# Patient Record
Sex: Female | Born: 1945 | Race: White | Hispanic: No | Smoking: Never smoker
Health system: Southern US, Community
[De-identification: ages and names within clinical notes are randomized; demographics above are authoritative.]

## PROBLEM LIST (undated history)

## (undated) DIAGNOSIS — I639 Cerebral infarction, unspecified: Secondary | ICD-10-CM

## (undated) DIAGNOSIS — I10 Essential (primary) hypertension: Secondary | ICD-10-CM

## (undated) DIAGNOSIS — N289 Disorder of kidney and ureter, unspecified: Secondary | ICD-10-CM

## (undated) DIAGNOSIS — E119 Type 2 diabetes mellitus without complications: Secondary | ICD-10-CM

## (undated) DIAGNOSIS — G610 Guillain-Barre syndrome: Secondary | ICD-10-CM

## (undated) HISTORY — DX: Guillain-Barre syndrome: G61.0

## (undated) HISTORY — DX: Cerebral infarction, unspecified: I63.9

---

## 2014-03-24 ENCOUNTER — Other Ambulatory Visit: Payer: Self-pay

## 2014-03-24 ENCOUNTER — Emergency Department (HOSPITAL_COMMUNITY): Payer: Medicare Other

## 2014-03-24 ENCOUNTER — Encounter (HOSPITAL_COMMUNITY): Payer: Self-pay | Admitting: *Deleted

## 2014-03-24 ENCOUNTER — Observation Stay (HOSPITAL_COMMUNITY)
Admission: EM | Admit: 2014-03-24 | Discharge: 2014-03-30 | Disposition: A | Discharge status: Skilled Nursing Facility | Payer: Medicare Other | Attending: Internal Medicine | Admitting: Internal Medicine

## 2014-03-24 DIAGNOSIS — R109 Unspecified abdominal pain: Secondary | ICD-10-CM | POA: Diagnosis present

## 2014-03-24 DIAGNOSIS — E119 Type 2 diabetes mellitus without complications: Secondary | ICD-10-CM | POA: Diagnosis not present

## 2014-03-24 DIAGNOSIS — K8010 Calculus of gallbladder with chronic cholecystitis without obstruction: Principal | ICD-10-CM | POA: Insufficient documentation

## 2014-03-24 DIAGNOSIS — K5900 Constipation, unspecified: Secondary | ICD-10-CM | POA: Diagnosis present

## 2014-03-24 DIAGNOSIS — G629 Polyneuropathy, unspecified: Secondary | ICD-10-CM

## 2014-03-24 DIAGNOSIS — R252 Cramp and spasm: Secondary | ICD-10-CM | POA: Diagnosis not present

## 2014-03-24 DIAGNOSIS — R9431 Abnormal electrocardiogram [ECG] [EKG]: Secondary | ICD-10-CM | POA: Diagnosis not present

## 2014-03-24 DIAGNOSIS — E871 Hypo-osmolality and hyponatremia: Secondary | ICD-10-CM | POA: Diagnosis not present

## 2014-03-24 DIAGNOSIS — E860 Dehydration: Secondary | ICD-10-CM | POA: Diagnosis not present

## 2014-03-24 DIAGNOSIS — K810 Acute cholecystitis: Secondary | ICD-10-CM

## 2014-03-24 DIAGNOSIS — R110 Nausea: Secondary | ICD-10-CM | POA: Diagnosis present

## 2014-03-24 DIAGNOSIS — E8770 Fluid overload, unspecified: Secondary | ICD-10-CM

## 2014-03-24 DIAGNOSIS — I5033 Acute on chronic diastolic (congestive) heart failure: Secondary | ICD-10-CM | POA: Diagnosis not present

## 2014-03-24 DIAGNOSIS — N390 Urinary tract infection, site not specified: Secondary | ICD-10-CM | POA: Diagnosis not present

## 2014-03-24 DIAGNOSIS — E785 Hyperlipidemia, unspecified: Secondary | ICD-10-CM | POA: Insufficient documentation

## 2014-03-24 DIAGNOSIS — E876 Hypokalemia: Secondary | ICD-10-CM | POA: Diagnosis present

## 2014-03-24 DIAGNOSIS — M6281 Muscle weakness (generalized): Secondary | ICD-10-CM | POA: Diagnosis present

## 2014-03-24 DIAGNOSIS — R1013 Epigastric pain: Secondary | ICD-10-CM

## 2014-03-24 DIAGNOSIS — N179 Acute kidney failure, unspecified: Secondary | ICD-10-CM | POA: Diagnosis not present

## 2014-03-24 DIAGNOSIS — N289 Disorder of kidney and ureter, unspecified: Secondary | ICD-10-CM

## 2014-03-24 DIAGNOSIS — I10 Essential (primary) hypertension: Secondary | ICD-10-CM | POA: Diagnosis not present

## 2014-03-24 DIAGNOSIS — I5032 Chronic diastolic (congestive) heart failure: Secondary | ICD-10-CM | POA: Diagnosis present

## 2014-03-24 DIAGNOSIS — R509 Fever, unspecified: Secondary | ICD-10-CM

## 2014-03-24 DIAGNOSIS — R079 Chest pain, unspecified: Secondary | ICD-10-CM

## 2014-03-24 DIAGNOSIS — M25611 Stiffness of right shoulder, not elsewhere classified: Secondary | ICD-10-CM | POA: Diagnosis present

## 2014-03-24 DIAGNOSIS — R531 Weakness: Secondary | ICD-10-CM

## 2014-03-24 DIAGNOSIS — K8020 Calculus of gallbladder without cholecystitis without obstruction: Secondary | ICD-10-CM

## 2014-03-24 DIAGNOSIS — R630 Anorexia: Secondary | ICD-10-CM

## 2014-03-24 HISTORY — DX: Type 2 diabetes mellitus without complications: E11.9

## 2014-03-24 HISTORY — DX: Essential (primary) hypertension: I10

## 2014-03-24 LAB — CBC WITH DIFFERENTIAL/PLATELET
Basophils Absolute: 0 10*3/uL (ref 0.0–0.1)
Basophils Relative: 0 % (ref 0–1)
EOS ABS: 0 10*3/uL (ref 0.0–0.7)
Eosinophils Relative: 0 % (ref 0–5)
HEMATOCRIT: 42 % (ref 36.0–46.0)
Hemoglobin: 14.5 g/dL (ref 12.0–15.0)
LYMPHS PCT: 15 % (ref 12–46)
Lymphs Abs: 1.5 10*3/uL (ref 0.7–4.0)
MCH: 29.5 pg (ref 26.0–34.0)
MCHC: 34.5 g/dL (ref 30.0–36.0)
MCV: 85.4 fL (ref 78.0–100.0)
Monocytes Absolute: 0.6 10*3/uL (ref 0.1–1.0)
Monocytes Relative: 6 % (ref 3–12)
NEUTROS ABS: 7.6 10*3/uL (ref 1.7–7.7)
Neutrophils Relative %: 79 % — ABNORMAL HIGH (ref 43–77)
PLATELETS: 183 10*3/uL (ref 150–400)
RBC: 4.92 MIL/uL (ref 3.87–5.11)
RDW: 14.4 % (ref 11.5–15.5)
WBC: 9.7 10*3/uL (ref 4.0–10.5)

## 2014-03-24 LAB — URINALYSIS, ROUTINE W REFLEX MICROSCOPIC
BILIRUBIN URINE: NEGATIVE
GLUCOSE, UA: NEGATIVE mg/dL
Hgb urine dipstick: NEGATIVE
Ketones, ur: 15 mg/dL — AB
NITRITE: NEGATIVE
PH: 7 (ref 5.0–8.0)
Protein, ur: NEGATIVE mg/dL
SPECIFIC GRAVITY, URINE: 1.004 — AB (ref 1.005–1.030)
Urobilinogen, UA: 0.2 mg/dL (ref 0.0–1.0)

## 2014-03-24 LAB — COMPREHENSIVE METABOLIC PANEL
ALBUMIN: 3.6 g/dL (ref 3.5–5.2)
ALT: 18 U/L (ref 0–35)
ANION GAP: 15 (ref 5–15)
AST: 30 U/L (ref 0–37)
Alkaline Phosphatase: 89 U/L (ref 39–117)
BILIRUBIN TOTAL: 2.6 mg/dL — AB (ref 0.3–1.2)
BUN: 17 mg/dL (ref 6–23)
CO2: 20 mmol/L (ref 19–32)
CREATININE: 1.21 mg/dL — AB (ref 0.50–1.10)
Calcium: 9.3 mg/dL (ref 8.4–10.5)
Chloride: 96 mEq/L (ref 96–112)
GFR calc non Af Amer: 44 mL/min — ABNORMAL LOW (ref >90)
GFR, EST AFRICAN AMERICAN: 51 mL/min — AB (ref >90)
GLUCOSE: 144 mg/dL — AB (ref 70–99)
POTASSIUM: 3.8 mmol/L (ref 3.5–5.1)
Sodium: 131 mmol/L — ABNORMAL LOW (ref 135–145)
TOTAL PROTEIN: 5.9 g/dL — AB (ref 6.0–8.3)

## 2014-03-24 LAB — D-DIMER, QUANTITATIVE: D-Dimer, Quant: 0.5 ug/mL-FEU — ABNORMAL HIGH (ref 0.00–0.48)

## 2014-03-24 LAB — URINE MICROSCOPIC-ADD ON

## 2014-03-24 LAB — LIPASE, BLOOD: LIPASE: 26 U/L (ref 11–59)

## 2014-03-24 MED ORDER — ONDANSETRON HCL 4 MG/2ML IJ SOLN
4.0000 mg | Freq: Once | INTRAMUSCULAR | Status: AC
  Administered 2014-03-24: 4 mg via INTRAVENOUS
  Filled 2014-03-24: qty 2

## 2014-03-24 MED ORDER — SODIUM CHLORIDE 0.9 % IV SOLN
INTRAVENOUS | Status: DC
  Administered 2014-03-24 – 2014-03-25 (×2): via INTRAVENOUS

## 2014-03-24 MED ORDER — FENTANYL CITRATE 0.05 MG/ML IJ SOLN
100.0000 ug | Freq: Once | INTRAMUSCULAR | Status: AC
  Administered 2014-03-24: 100 ug via INTRAVENOUS
  Filled 2014-03-24: qty 2

## 2014-03-24 MED ORDER — FENTANYL CITRATE 0.05 MG/ML IJ SOLN: 100.0000 ug | Freq: Once | INTRAMUSCULAR | Status: DC

## 2014-03-24 MED ORDER — PANTOPRAZOLE SODIUM 40 MG IV SOLR
40.0000 mg | Freq: Once | INTRAVENOUS | Status: AC
  Administered 2014-03-24: 40 mg via INTRAVENOUS
  Filled 2014-03-24: qty 40

## 2014-03-24 NOTE — ED Notes (Signed)
Pt off unit with Xray 

## 2014-03-24 NOTE — ED Notes (Signed)
Pt back to unit from x-ray

## 2014-03-24 NOTE — ED Notes (Signed)
Per EMS, pt has been having CP x 3 days intermittently. [facility] where she was told she had a her[facility]. EMS reports that there was some depression in lateral leads. Pt received 324ASA and 1 nitro and is no longer having active chest pain but still having back pain. Pt received 200 LR and has an 18g in left AC.

## 2014-03-24 NOTE — ED Provider Notes (Signed)
CSN: [id number]     Arrival date & time 03/24/14  1811 History   First MD Initiated Contact with Patient 03/24/14 1821     Chief Complaint  Patient presents with  . Chest Pain     (Consider location/radiation/quality/duration/timing/severity/associated sxs/prior Treatment) Patient is a 69 y.o. female presenting with chest pain. The history is provided by the patient.  Chest Pain  Tiffay Pinette is a 69 y.o. female who is here for evaluation of continuous upper abdominal/chest pain, which has been present for 5 days.  She has periods of time when it is tolerable at about 4 or 5 out of 10, but then at other times it goes up to 10 out of 10.  She has been evaluated twice in a emergency department, in [place], [place].  She was treated with Percocet and Cipro.  She was diagnosed with nonspecific abdominal pain, and urinary tract infection.  She could not take the Percocet because it made her nauseated and dizzy.  She has never had this problem previously.  She has been evaluated with CT scan of the head, and the abdomen.  She denies cardiac history.  Her husband died 6 weeks ago.  When talking about his death, the patient began to be tearful, for a short period of time. The family members state that she is doing pretty well with this transition.  Over the weekend, they went to [place], [place]; while there she had fallen, and was happy.  Patient's daughters are with her and to not feel like that the death of her spouse is causing the patient to have pain.  She describes the pain as "bandlike" around her entire thorax.  She has decreased appetite but no nausea, vomiting or diarrhea.  She was transferred here by EMS, during transport was treated with aspirin and nitroglycerin.  She had improvement of her chest discomfort but still has pain 4 out of 10 at the time of arrival and evaluation.  There are no other known modifying factors.    History reviewed. No pertinent past medical history. History  reviewed. No pertinent past surgical history. No family history on file. History  Substance Use Topics  . Smoking status: Not on file  . Smokeless tobacco: Not on file  . Alcohol Use: Not on file   OB History    No data available     Review of Systems  Cardiovascular: Positive for chest pain.  All other systems reviewed and are negative.     Allergies  Review of patient's allergies indicates not on file.  Home Medications   Prior to Admission medications   Not on File   BP 170/94 mmHg  Pulse 89  Temp(Src) 98.9 F (37.2 C) (Oral)  Resp 24  SpO2 100% Physical Exam  Constitutional: She is oriented to person, place, and time. She appears well-developed and well-nourished. No distress.  HENT:  Head: Normocephalic and atraumatic.  Right Ear: External ear normal.  Left Ear: External ear normal.  Eyes: Conjunctivae and EOM are normal. Pupils are equal, round, and reactive to light.  Neck: Normal range of motion and phonation normal. Neck supple.  Cardiovascular: Normal rate, regular rhythm and normal heart sounds.   Pulmonary/Chest: Effort normal and breath sounds normal. She exhibits no bony tenderness.  No focal chest wall tenderness, or crepitation.  Abdominal: Soft. There is tenderness (Epigastric, mild).  Musculoskeletal: Normal range of motion. She exhibits no edema or tenderness.  Neurological: She is alert and oriented to person, place,  and time. No cranial nerve deficit or sensory deficit. She exhibits normal muscle tone. Coordination normal.  Skin: Skin is warm, dry and intact.  Psychiatric: She has a normal mood and affect. Her behavior is normal. Judgment and thought content normal.  Nursing note and vitals reviewed.   ED Course  Procedures (including critical care time) Medications  0.9 %  sodium chloride infusion ( Intravenous Stopped 03/24/14 2328)  fentaNYL (SUBLIMAZE) injection 100 mcg (not administered)  pantoprazole (PROTONIX) injection 40 mg (40 mg  Intravenous Given 03/24/14 1932)  fentaNYL (SUBLIMAZE) injection 100 mcg (100 mcg Intravenous Given 03/24/14 1936)  ondansetron (ZOFRAN) injection 4 mg (4 mg Intravenous Given 03/24/14 1935)  fentaNYL (SUBLIMAZE) injection 100 mcg (100 mcg Intravenous Given 03/24/14 2245)  ondansetron (ZOFRAN) injection 4 mg (4 mg Intravenous Given 03/24/14 2245)    Patient Vitals for the past 24 hrs:  BP Temp Temp src Pulse Resp SpO2 Height Weight  03/24/14 2325 171/75 mmHg - - 82 25 100 % - -  03/24/14 2245 158/72 mmHg - - 94 20 100 % - -  03/24/14 2230 165/79 mmHg - - 89 21 100 % - -  03/24/14 2215 165/89 mmHg - - 82 19 100 % - -  03/24/14 2150 163/78 mmHg - - 88 23 100 % - -  03/24/14 2100 145/68 mmHg - - 83 18 100 % - -  03/24/14 2045 166/79 mmHg - - 85 18 100 % - -  03/24/14 2030 174/77 mmHg - - 77 18 98 % - -  03/24/14 1930 168/71 mmHg - - 84 22 100 % - -  03/24/14 1900 167/81 mmHg - - 81 13 100 % - -  03/24/14 1859 - - - - - -  (1.549 m) 150 lb (68.04 kg)  03/24/14 1845 158/85 mmHg - - 83 16 100 % - -  03/24/14 1825 170/94 mmHg 98.9 F (37.2 C) Oral 89 24 100 % - -  03/24/14 1819 - - - - - 98 % - -    11:41 PM Reevaluation with update and discussion. After initial assessment and treatment, an updated evaluation reveals attempt at ambulation failed because of weakness and exacerbation of pain, when attempting to stand.  Findings discussed with patient, and family members, all questions answered. Tally Mattox L   23:35- consultation general surgery, Dr. Janee Morn requests medical admission and he will evaluate the patient, as a consultant for possible cholecystectomy.  11:42 PM-Consult complete with Dr. Julian Reil. Patient case explained and discussed. He agrees to admit patient for further evaluation and treatment. Call ended at 23:50      Labs Review Labs Reviewed - No data to display  Imaging Review No results found.   EKG Interpretation   Date/Time:  Thursday March 24 2014 18:16:17  EST Ventricular Rate:  83 PR Interval:  126 QRS Duration: 86 QT Interval:  448 QTC Calculation: 526 R Axis:   48 Text Interpretation:  Normal sinus rhythm ST \\T \ T wave abnormality,  consider inferior ischemia Prolonged QT Abnormal ECG No old tracing to  compare Confirmed by [facility]  MD, Thorvald Orsino [phone]) on 03/24/2014 6:52:03 PM      MDM   Final diagnoses:  Epigastric pain  Gallstones  Nonspecific chest pain  Renal insufficiency  Weakness  Anorexia   Nonspecific upper abdominal/lower chest pain, persistent for several days.  She has had extensive evaluations, and tonight an ultrasound reveals gallstones.  It is unclear if this finding is a unifying diagnosis for all  of her symptoms.  The patient is too weak to stand and ambulate, and this is likely secondary to poor intake over the last several days.  I doubt ACS, PE (age-related d-dimer, is normal), pneumonia, urinary check infection or sepsis.  She will need admission for parenteral fluids, observation and consultation with surgery.  Nursing Notes Reviewed/ Care Coordinated, and agree without changes. Applicable Imaging Reviewed.  Interpretation of Laboratory Data incorporated into ED treatment  Plan: Admit     Flint MelterElliott L Deagan Sevin, MD 03/25/14 [phone]

## 2014-03-24 NOTE — ED Notes (Signed)
Pt returned from ultrasound

## 2014-03-25 ENCOUNTER — Observation Stay (HOSPITAL_COMMUNITY): Payer: Medicare Other | Admitting: Anesthesiology

## 2014-03-25 ENCOUNTER — Observation Stay (HOSPITAL_COMMUNITY): Payer: Medicare Other

## 2014-03-25 ENCOUNTER — Encounter (HOSPITAL_COMMUNITY): Payer: Self-pay | Admitting: Anesthesiology

## 2014-03-25 ENCOUNTER — Encounter (HOSPITAL_COMMUNITY)
Admission: EM | Disposition: A | Discharge status: Skilled Nursing Facility | Payer: Self-pay | Source: Home / Self Care | Attending: Emergency Medicine

## 2014-03-25 DIAGNOSIS — N179 Acute kidney failure, unspecified: Secondary | ICD-10-CM | POA: Diagnosis not present

## 2014-03-25 DIAGNOSIS — K8010 Calculus of gallbladder with chronic cholecystitis without obstruction: Secondary | ICD-10-CM | POA: Diagnosis not present

## 2014-03-25 DIAGNOSIS — R110 Nausea: Secondary | ICD-10-CM

## 2014-03-25 DIAGNOSIS — R1013 Epigastric pain: Secondary | ICD-10-CM

## 2014-03-25 DIAGNOSIS — R109 Unspecified abdominal pain: Secondary | ICD-10-CM | POA: Diagnosis present

## 2014-03-25 DIAGNOSIS — I10 Essential (primary) hypertension: Secondary | ICD-10-CM | POA: Diagnosis not present

## 2014-03-25 DIAGNOSIS — E871 Hypo-osmolality and hyponatremia: Secondary | ICD-10-CM

## 2014-03-25 DIAGNOSIS — I5033 Acute on chronic diastolic (congestive) heart failure: Secondary | ICD-10-CM | POA: Diagnosis not present

## 2014-03-25 HISTORY — PX: CHOLECYSTECTOMY: SHX55

## 2014-03-25 LAB — BASIC METABOLIC PANEL
ANION GAP: 7 (ref 5–15)
Anion gap: 7 (ref 5–15)
BUN: 14 mg/dL (ref 6–23)
BUN: 12 mg/dL (ref 6–23)
CALCIUM: 8.6 mg/dL (ref 8.4–10.5)
CHLORIDE: 105 meq/L (ref 96–112)
CO2: 23 mmol/L (ref 19–32)
CO2: 24 mmol/L (ref 19–32)
Calcium: 8.6 mg/dL (ref 8.4–10.5)
Chloride: 103 mEq/L (ref 96–112)
Creatinine, Ser: 1.17 mg/dL — ABNORMAL HIGH (ref 0.50–1.10)
Creatinine, Ser: 1.1 mg/dL (ref 0.50–1.10)
GFR calc Af Amer: 53 mL/min — ABNORMAL LOW (ref >90)
GFR calc Af Amer: 58 mL/min — ABNORMAL LOW (ref >90)
GFR calc non Af Amer: 46 mL/min — ABNORMAL LOW (ref >90)
GFR calc non Af Amer: 50 mL/min — ABNORMAL LOW (ref >90)
GLUCOSE: 140 mg/dL — AB (ref 70–99)
GLUCOSE: 151 mg/dL — AB (ref 70–99)
Potassium: 2.7 mmol/L — CL (ref 3.5–5.1)
Potassium: 3.9 mmol/L (ref 3.5–5.1)
SODIUM: 134 mmol/L — AB (ref 135–145)
Sodium: 135 mmol/L (ref 135–145)

## 2014-03-25 LAB — CBC WITH DIFFERENTIAL/PLATELET
BASOS ABS: 0 10*3/uL (ref 0.0–0.1)
BASOS PCT: 0 % (ref 0–1)
EOS ABS: 0.2 10*3/uL (ref 0.0–0.7)
EOS PCT: 2 % (ref 0–5)
HCT: 39.8 % (ref 36.0–46.0)
Hemoglobin: 13.2 g/dL (ref 12.0–15.0)
LYMPHS ABS: 1 10*3/uL (ref 0.7–4.0)
Lymphocytes Relative: 11 % — ABNORMAL LOW (ref 12–46)
MCH: 27.9 pg (ref 26.0–34.0)
MCHC: 33.2 g/dL (ref 30.0–36.0)
MCV: 84.1 fL (ref 78.0–100.0)
Monocytes Absolute: 0.8 10*3/uL (ref 0.1–1.0)
Monocytes Relative: 9 % (ref 3–12)
NEUTROS ABS: 7.4 10*3/uL (ref 1.7–7.7)
Neutrophils Relative %: 78 % — ABNORMAL HIGH (ref 43–77)
Platelets: 186 10*3/uL (ref 150–400)
RBC: 4.73 MIL/uL (ref 3.87–5.11)
RDW: 14.7 % (ref 11.5–15.5)
WBC: 9.4 10*3/uL (ref 4.0–10.5)

## 2014-03-25 LAB — HEPATIC FUNCTION PANEL
ALT: 24 U/L (ref 0–35)
AST: 53 U/L — ABNORMAL HIGH (ref 0–37)
Albumin: 3.4 g/dL — ABNORMAL LOW (ref 3.5–5.2)
Alkaline Phosphatase: 82 U/L (ref 39–117)
BILIRUBIN DIRECT: 0.2 mg/dL (ref 0.0–0.3)
Indirect Bilirubin: 0.7 mg/dL (ref 0.3–0.9)
Total Bilirubin: 0.9 mg/dL (ref 0.3–1.2)
Total Protein: 5.9 g/dL — ABNORMAL LOW (ref 6.0–8.3)

## 2014-03-25 LAB — CBC
HCT: 37.2 % (ref 36.0–46.0)
Hemoglobin: 12.4 g/dL (ref 12.0–15.0)
MCH: 27.7 pg (ref 26.0–34.0)
MCHC: 33.3 g/dL (ref 30.0–36.0)
MCV: 83.2 fL (ref 78.0–100.0)
Platelets: 187 10*3/uL (ref 150–400)
RBC: 4.47 MIL/uL (ref 3.87–5.11)
RDW: 14.6 % (ref 11.5–15.5)
WBC: 8.2 10*3/uL (ref 4.0–10.5)

## 2014-03-25 LAB — URINE CULTURE: Special Requests: NORMAL

## 2014-03-25 LAB — TROPONIN I
TROPONIN I: 0.04 ng/mL — AB (ref <0.031)
Troponin I: 0.03 ng/mL (ref <0.031)

## 2014-03-25 LAB — SURGICAL PCR SCREEN
MRSA, PCR: NEGATIVE
Staphylococcus aureus: POSITIVE — AB

## 2014-03-25 LAB — MAGNESIUM: Magnesium: 1.8 mg/dL (ref 1.5–2.5)

## 2014-03-25 SURGERY — LAPAROSCOPIC CHOLECYSTECTOMY WITH INTRAOPERATIVE CHOLANGIOGRAM
Anesthesia: General | Site: Abdomen

## 2014-03-25 MED ORDER — 0.9 % SODIUM CHLORIDE (POUR BTL) OPTIME
TOPICAL | Status: DC | PRN
  Administered 2014-03-25: 1000 mL

## 2014-03-25 MED ORDER — CIPROFLOXACIN IN D5W 400 MG/200ML IV SOLN
400.0000 mg | Freq: Two times a day (BID) | INTRAVENOUS | Status: DC
  Administered 2014-03-25 – 2014-03-26 (×2): 400 mg via INTRAVENOUS
  Filled 2014-03-25 (×4): qty 200

## 2014-03-25 MED ORDER — GLYCOPYRROLATE 0.2 MG/ML IJ SOLN
INTRAMUSCULAR | Status: AC
  Filled 2014-03-25: qty 1

## 2014-03-25 MED ORDER — PHENYLEPHRINE HCL 10 MG/ML IJ SOLN
INTRAMUSCULAR | Status: DC | PRN
  Administered 2014-03-25 (×2): 120 ug via INTRAVENOUS

## 2014-03-25 MED ORDER — LACTATED RINGERS IV SOLN
INTRAVENOUS | Status: DC
  Administered 2014-03-25: 12:00:00 via INTRAVENOUS

## 2014-03-25 MED ORDER — HYDROMORPHONE HCL 1 MG/ML IJ SOLN
0.2500 mg | INTRAMUSCULAR | Status: DC | PRN
  Administered 2014-03-25: 0.5 mg via INTRAVENOUS

## 2014-03-25 MED ORDER — MUPIROCIN 2 % EX OINT
1.0000 "application " | TOPICAL_OINTMENT | Freq: Two times a day (BID) | CUTANEOUS | Status: AC
  Administered 2014-03-25 – 2014-03-29 (×8): 1 via NASAL
  Filled 2014-03-25: qty 22

## 2014-03-25 MED ORDER — FENTANYL CITRATE 0.05 MG/ML IJ SOLN
INTRAMUSCULAR | Status: AC
  Filled 2014-03-25: qty 5

## 2014-03-25 MED ORDER — LIDOCAINE HCL (CARDIAC) 20 MG/ML IV SOLN
INTRAVENOUS | Status: AC
  Filled 2014-03-25: qty 5

## 2014-03-25 MED ORDER — HYDROMORPHONE HCL 1 MG/ML IJ SOLN
0.5000 mg | INTRAMUSCULAR | Status: DC | PRN
  Administered 2014-03-25 (×3): 1 mg via INTRAVENOUS
  Filled 2014-03-25 (×4): qty 1

## 2014-03-25 MED ORDER — CHLORHEXIDINE GLUCONATE CLOTH 2 % EX PADS
6.0000 | MEDICATED_PAD | Freq: Every day | CUTANEOUS | Status: AC
  Administered 2014-03-26 – 2014-03-29 (×4): 6 via TOPICAL

## 2014-03-25 MED ORDER — ARTIFICIAL TEARS OP OINT
TOPICAL_OINTMENT | OPHTHALMIC | Status: DC | PRN
Start: 2014-03-25 — End: 2014-03-25
  Administered 2014-03-25: 1 via OPHTHALMIC

## 2014-03-25 MED ORDER — HYDROMORPHONE HCL 1 MG/ML IJ SOLN
0.5000 mg | Freq: Once | INTRAMUSCULAR | Status: AC
  Administered 2014-03-25: 0.5 mg via INTRAVENOUS
  Filled 2014-03-25: qty 1

## 2014-03-25 MED ORDER — ONDANSETRON HCL 4 MG PO TABS: 4.0000 mg | ORAL_TABLET | Freq: Three times a day (TID) | ORAL | Status: DC | PRN

## 2014-03-25 MED ORDER — METOPROLOL TARTRATE 100 MG PO TABS
100.0000 mg | ORAL_TABLET | Freq: Two times a day (BID) | ORAL | Status: DC
  Administered 2014-03-25 – 2014-03-30 (×11): 100 mg via ORAL
  Filled 2014-03-25 (×13): qty 1

## 2014-03-25 MED ORDER — GI COCKTAIL ~~LOC~~
30.0000 mL | Freq: Three times a day (TID) | ORAL | Status: DC | PRN
  Administered 2014-03-25 – 2014-03-26 (×3): 30 mL via ORAL
  Filled 2014-03-25 (×7): qty 30

## 2014-03-25 MED ORDER — HYDRALAZINE HCL 20 MG/ML IJ SOLN
INTRAMUSCULAR | Status: AC
  Filled 2014-03-25: qty 1

## 2014-03-25 MED ORDER — TRAMADOL HCL 50 MG PO TABS
50.0000 mg | ORAL_TABLET | Freq: Three times a day (TID) | ORAL | Status: DC | PRN
  Administered 2014-03-25: 50 mg via ORAL
  Filled 2014-03-25: qty 1

## 2014-03-25 MED ORDER — SODIUM CHLORIDE 0.9 % IV SOLN
INTRAVENOUS | Status: DC
Start: 2014-03-25 — End: 2014-03-25

## 2014-03-25 MED ORDER — ONDANSETRON HCL 4 MG/2ML IJ SOLN: 4.0000 mg | Freq: Four times a day (QID) | INTRAMUSCULAR | Status: DC | PRN

## 2014-03-25 MED ORDER — BUPIVACAINE HCL (PF) 0.25 % IJ SOLN
INTRAMUSCULAR | Status: AC
  Filled 2014-03-25: qty 30

## 2014-03-25 MED ORDER — ROCURONIUM BROMIDE 50 MG/5ML IV SOLN
INTRAVENOUS | Status: AC
  Filled 2014-03-25: qty 1

## 2014-03-25 MED ORDER — SPOT INK MARKER SYRINGE KIT
PACK | SUBMUCOSAL | Status: AC
  Filled 2014-03-25: qty 5

## 2014-03-25 MED ORDER — OXYCODONE-ACETAMINOPHEN 5-325 MG PO TABS
1.0000 | ORAL_TABLET | ORAL | Status: DC | PRN
  Filled 2014-03-25: qty 2

## 2014-03-25 MED ORDER — ALPRAZOLAM 0.25 MG PO TABS: 0.2500 mg | ORAL_TABLET | Freq: Three times a day (TID) | ORAL | Status: DC | PRN

## 2014-03-25 MED ORDER — PROPOFOL 10 MG/ML IV BOLUS
INTRAVENOUS | Status: AC
  Filled 2014-03-25: qty 20

## 2014-03-25 MED ORDER — ROCURONIUM BROMIDE 100 MG/10ML IV SOLN
INTRAVENOUS | Status: DC | PRN
  Administered 2014-03-25: 40 mg via INTRAVENOUS

## 2014-03-25 MED ORDER — BUPIVACAINE-EPINEPHRINE 0.25% -1:200000 IJ SOLN: INTRAMUSCULAR | Status: DC | PRN

## 2014-03-25 MED ORDER — ONDANSETRON HCL 4 MG PO TABS
4.0000 mg | ORAL_TABLET | Freq: Four times a day (QID) | ORAL | Status: DC | PRN
  Administered 2014-03-30: 4 mg via ORAL
  Filled 2014-03-25: qty 1

## 2014-03-25 MED ORDER — FENTANYL CITRATE 0.05 MG/ML IJ SOLN: 50.0000 ug | INTRAMUSCULAR | Status: DC | PRN

## 2014-03-25 MED ORDER — HYDROMORPHONE HCL 1 MG/ML IJ SOLN
INTRAMUSCULAR | Status: AC
  Filled 2014-03-25: qty 1

## 2014-03-25 MED ORDER — ENOXAPARIN SODIUM 40 MG/0.4ML ~~LOC~~ SOLN
40.0000 mg | SUBCUTANEOUS | Status: DC
  Administered 2014-03-26 – 2014-03-30 (×5): 40 mg via SUBCUTANEOUS
  Filled 2014-03-25 (×6): qty 0.4

## 2014-03-25 MED ORDER — CEFAZOLIN SODIUM-DEXTROSE 2-3 GM-% IV SOLR
INTRAVENOUS | Status: AC
  Filled 2014-03-25: qty 50

## 2014-03-25 MED ORDER — ATORVASTATIN CALCIUM 20 MG PO TABS: 20.0000 mg | ORAL_TABLET | Freq: Every day | ORAL | Status: DC

## 2014-03-25 MED ORDER — SODIUM CHLORIDE 0.9 % IR SOLN
Status: DC | PRN
  Administered 2014-03-25: 1000 mL

## 2014-03-25 MED ORDER — EPHEDRINE SULFATE 50 MG/ML IJ SOLN
INTRAMUSCULAR | Status: DC | PRN
  Administered 2014-03-25 (×2): 10 mg via INTRAVENOUS

## 2014-03-25 MED ORDER — ONDANSETRON HCL 4 MG/2ML IJ SOLN
INTRAMUSCULAR | Status: DC | PRN
  Administered 2014-03-25: 4 mg via INTRAVENOUS

## 2014-03-25 MED ORDER — ONDANSETRON HCL 4 MG/2ML IJ SOLN
INTRAMUSCULAR | Status: AC
  Filled 2014-03-25: qty 2

## 2014-03-25 MED ORDER — POTASSIUM CHLORIDE 10 MEQ/100ML IV SOLN
10.0000 meq | INTRAVENOUS | Status: AC
  Administered 2014-03-25 (×4): 10 meq via INTRAVENOUS
  Filled 2014-03-25 (×4): qty 100

## 2014-03-25 MED ORDER — CIPROFLOXACIN HCL 250 MG PO TABS
250.0000 mg | ORAL_TABLET | Freq: Two times a day (BID) | ORAL | Status: DC
  Administered 2014-03-25: 250 mg via ORAL
  Filled 2014-03-25 (×3): qty 1

## 2014-03-25 MED ORDER — CEFAZOLIN SODIUM-DEXTROSE 2-3 GM-% IV SOLR
2.0000 g | Freq: Once | INTRAVENOUS | Status: AC
  Administered 2014-03-25: 2 g via INTRAVENOUS

## 2014-03-25 MED ORDER — CIPROFLOXACIN IN D5W 400 MG/200ML IV SOLN
400.0000 mg | Freq: Once | INTRAVENOUS | Status: AC
  Administered 2014-03-25: 400 mg via INTRAVENOUS
  Filled 2014-03-25: qty 200

## 2014-03-25 MED ORDER — GLYCOPYRROLATE 0.2 MG/ML IJ SOLN
INTRAMUSCULAR | Status: DC | PRN
  Administered 2014-03-25: 0.6 mg via INTRAVENOUS

## 2014-03-25 MED ORDER — LACTATED RINGERS IV SOLN
INTRAVENOUS | Status: DC | PRN
Start: 2014-03-25 — End: 2014-03-25
  Administered 2014-03-25: 14:00:00 via INTRAVENOUS

## 2014-03-25 MED ORDER — HYDROMORPHONE HCL 1 MG/ML IJ SOLN
1.0000 mg | INTRAMUSCULAR | Status: DC | PRN
  Administered 2014-03-25 – 2014-03-26 (×4): 1 mg via INTRAVENOUS
  Filled 2014-03-25 (×3): qty 1

## 2014-03-25 MED ORDER — BUPIVACAINE HCL 0.25 % IJ SOLN
INTRAMUSCULAR | Status: DC | PRN
  Administered 2014-03-25: 4 mL

## 2014-03-25 MED ORDER — MIDAZOLAM HCL 2 MG/2ML IJ SOLN
INTRAMUSCULAR | Status: AC
  Filled 2014-03-25: qty 2

## 2014-03-25 MED ORDER — ATORVASTATIN CALCIUM 20 MG PO TABS
20.0000 mg | ORAL_TABLET | Freq: Every day | ORAL | Status: DC
  Administered 2014-03-25 – 2014-03-28 (×4): 20 mg via ORAL
  Filled 2014-03-25 (×6): qty 1

## 2014-03-25 MED ORDER — LIDOCAINE HCL (CARDIAC) 20 MG/ML IV SOLN
INTRAVENOUS | Status: DC | PRN
  Administered 2014-03-25: 60 mg via INTRAVENOUS

## 2014-03-25 MED ORDER — FENTANYL CITRATE 0.05 MG/ML IJ SOLN
INTRAMUSCULAR | Status: DC | PRN
  Administered 2014-03-25 (×3): 50 ug via INTRAVENOUS

## 2014-03-25 MED ORDER — ONDANSETRON HCL 4 MG/2ML IJ SOLN
4.0000 mg | Freq: Four times a day (QID) | INTRAMUSCULAR | Status: DC | PRN
  Administered 2014-03-25: 4 mg via INTRAVENOUS
  Filled 2014-03-25: qty 2

## 2014-03-25 MED ORDER — SODIUM CHLORIDE 0.9 % IV SOLN: INTRAVENOUS | Status: DC

## 2014-03-25 MED ORDER — HYDRALAZINE HCL 20 MG/ML IJ SOLN
10.0000 mg | INTRAMUSCULAR | Status: DC | PRN
  Administered 2014-03-25: 10 mg via INTRAVENOUS

## 2014-03-25 MED ORDER — ONDANSETRON HCL 4 MG PO TABS: 4.0000 mg | ORAL_TABLET | Freq: Four times a day (QID) | ORAL | Status: DC | PRN

## 2014-03-25 MED ORDER — SODIUM CHLORIDE 0.9 % IV SOLN
INTRAVENOUS | Status: DC | PRN
  Administered 2014-03-25: 9 mL

## 2014-03-25 MED ORDER — MIDAZOLAM HCL 5 MG/5ML IJ SOLN
INTRAMUSCULAR | Status: DC | PRN
  Administered 2014-03-25: 1 mg via INTRAVENOUS

## 2014-03-25 MED ORDER — PROPOFOL 10 MG/ML IV BOLUS
INTRAVENOUS | Status: DC | PRN
  Administered 2014-03-25: 160 mg via INTRAVENOUS

## 2014-03-25 MED ORDER — HEPARIN SODIUM (PORCINE) 5000 UNIT/ML IJ SOLN
5000.0000 [IU] | Freq: Three times a day (TID) | INTRAMUSCULAR | Status: DC
  Administered 2014-03-25 (×2): 5000 [IU] via SUBCUTANEOUS
  Filled 2014-03-25 (×5): qty 1

## 2014-03-25 MED ORDER — DEXTROSE-NACL 5-0.9 % IV SOLN: INTRAVENOUS | Status: DC

## 2014-03-25 MED ORDER — DEXTROSE 5 % IV SOLN
10.0000 mg | INTRAVENOUS | Status: DC | PRN
  Administered 2014-03-25: 100 ug/min via INTRAVENOUS

## 2014-03-25 MED ORDER — CYCLOBENZAPRINE HCL 10 MG PO TABS: 10.0000 mg | ORAL_TABLET | Freq: Every evening | ORAL | Status: DC | PRN

## 2014-03-25 MED ORDER — NEOSTIGMINE METHYLSULFATE 10 MG/10ML IV SOLN
INTRAVENOUS | Status: DC | PRN
  Administered 2014-03-25: 4 mg via INTRAVENOUS

## 2014-03-25 MED ORDER — ACETAMINOPHEN 500 MG PO TABS: 500.0000 mg | ORAL_TABLET | Freq: Four times a day (QID) | ORAL | Status: DC | PRN

## 2014-03-25 SURGICAL SUPPLY — 44 items
APPLIER CLIP ROT 10 11.4 M/L (STAPLE) ×3
BENZOIN TINCTURE PRP APPL 2/3 (GAUZE/BANDAGES/DRESSINGS) ×3 IMPLANT
CANISTER SUCTION 2500CC (MISCELLANEOUS) ×3 IMPLANT
CHLORAPREP W/TINT 26ML (MISCELLANEOUS) ×3 IMPLANT
CLIP APPLIE ROT 10 11.4 M/L (STAPLE) ×1 IMPLANT
CLIP LIGATING HEMO O LOK GREEN (MISCELLANEOUS) ×6 IMPLANT
CLOSURE WOUND 1/2 X4 (GAUZE/BANDAGES/DRESSINGS) ×1
COVER MAYO STAND STRL (DRAPES) ×3 IMPLANT
COVER SURGICAL LIGHT HANDLE (MISCELLANEOUS) ×3 IMPLANT
DRAPE C-ARM 42X72 X-RAY (DRAPES) ×3 IMPLANT
DRAPE LAPAROSCOPIC ABDOMINAL (DRAPES) ×3 IMPLANT
DRSG TEGADERM 2-3/8X2-3/4 SM (GAUZE/BANDAGES/DRESSINGS) ×9 IMPLANT
DRSG TEGADERM 4X4.75 (GAUZE/BANDAGES/DRESSINGS) ×3 IMPLANT
ELECT REM PT RETURN 9FT ADLT (ELECTROSURGICAL) ×3
ELECTRODE REM PT RTRN 9FT ADLT (ELECTROSURGICAL) ×1 IMPLANT
FILTER SMOKE EVAC LAPAROSHD (FILTER) ×3 IMPLANT
GAUZE SPONGE 2X2 8PLY STRL LF (GAUZE/BANDAGES/DRESSINGS) ×1 IMPLANT
GLOVE BIO SURGEON STRL SZ7 (GLOVE) ×3 IMPLANT
GLOVE BIO SURGEON STRL SZ7.5 (GLOVE) ×3 IMPLANT
GLOVE BIOGEL PI IND STRL 7.5 (GLOVE) ×4 IMPLANT
GLOVE BIOGEL PI INDICATOR 7.5 (GLOVE) ×8
GLOVE SURG SS PI 7.0 STRL IVOR (GLOVE) ×3 IMPLANT
GOWN STRL REUS W/ TWL LRG LVL3 (GOWN DISPOSABLE) ×3 IMPLANT
GOWN STRL REUS W/TWL LRG LVL3 (GOWN DISPOSABLE) ×6
KIT BASIN OR (CUSTOM PROCEDURE TRAY) ×3 IMPLANT
KIT ROOM TURNOVER OR (KITS) ×3 IMPLANT
NS IRRIG 1000ML POUR BTL (IV SOLUTION) ×3 IMPLANT
PAD ARMBOARD 7.5X6 YLW CONV (MISCELLANEOUS) ×3 IMPLANT
POUCH SPECIMEN RETRIEVAL 10MM (ENDOMECHANICALS) ×3 IMPLANT
SCISSORS LAP 5X35 DISP (ENDOMECHANICALS) ×3 IMPLANT
SET CHOLANGIOGRAPH 5 50 .035 (SET/KITS/TRAYS/PACK) ×3 IMPLANT
SET IRRIG TUBING LAPAROSCOPIC (IRRIGATION / IRRIGATOR) ×3 IMPLANT
SLEEVE ENDOPATH XCEL 5M (ENDOMECHANICALS) ×3 IMPLANT
SPECIMEN JAR SMALL (MISCELLANEOUS) ×3 IMPLANT
SPONGE GAUZE 2X2 STER 10/PKG (GAUZE/BANDAGES/DRESSINGS) ×2
STRIP CLOSURE SKIN 1/2X4 (GAUZE/BANDAGES/DRESSINGS) ×2 IMPLANT
SUT MNCRL AB 4-0 PS2 18 (SUTURE) ×3 IMPLANT
TOWEL OR 17X24 6PK STRL BLUE (TOWEL DISPOSABLE) ×3 IMPLANT
TOWEL OR 17X26 10 PK STRL BLUE (TOWEL DISPOSABLE) ×3 IMPLANT
TRAY LAPAROSCOPIC (CUSTOM PROCEDURE TRAY) ×3 IMPLANT
TROCAR XCEL BLUNT TIP 100MML (ENDOMECHANICALS) ×3 IMPLANT
TROCAR XCEL NON-BLD 11X100MML (ENDOMECHANICALS) ×3 IMPLANT
TROCAR XCEL NON-BLD 5MMX100MML (ENDOMECHANICALS) ×3 IMPLANT
TUBING INSUFFLATION (TUBING) ×3 IMPLANT

## 2014-03-25 NOTE — Op Note (Signed)
03/24/2014 - 03/25/2014  3:09 PM  PATIENT:  Anna Klein  69 y.o. female  PRE-OPERATIVE DIAGNOSIS:  cholecystitis  POST-OPERATIVE DIAGNOSIS:  cholecystitis  PROCEDURE:  Procedure(s): LAPAROSCOPIC CHOLECYSTECTOMY WITH INTRAOPERATIVE CHOLANGIOGRAM (N/A)  SURGEON:  Surgeon(s) and Role:    * Axel FillerArmando Trudy Kory, MD - Primary  ASSISTANTS: none   ANESTHESIA:   local and general  EBL:  Total I/O In: 0  Out: 100 [Urine:100]  BLOOD ADMINISTERED:none  DRAINS: none   LOCAL MEDICATIONS USED:  BUPIVICAINE   SPECIMEN:  Source of Specimen:  gallbladder  DISPOSITION OF SPECIMEN:  PATHOLOGY  COUNTS:  YES  TOURNIQUET:  * No tourniquets in log *  DICTATION: .Dragon Dictation  Findings:Chronically inflammed GB with stones  Indications for procedure: Pt is a 69 y/o F with RUQ pain and seen to have gallstones.   Details of the procedure: The patient was taken to the operating and placed in the supine position with bilateral SCDs in place. A time out was called and all facts were verified. A pneumoperitoneum was obtained via A Veress needle technique to a pressure of 14mm of mercury. A 5mm trochar was then placed in the right upper quadrant under visualization, and there were no injuries to any abdominal organs. A 11 mm port was then placed in the umbilical region after infiltrating with local anesthesia under direct visualization. A second epigastric port was placed under direct visualization. The gallbladder was identified and retracted, the peritoneum was then sharply dissected from the gallbladder and this dissection was carried down to Calot's triangle. The cystic duct was identified and stripped away circumferentially and seen going into the gallbladder 360, the critical angle was obtained. A Cook catheter was used to perform an intraoperative cholangiogram. The cystic duct and common bile duct were seen free of filling defects. 2 clips were placed proximally one distally and the cystic duct  transected. The cystic artery was identified and 2 clips placed proximally and one distally and transected. We then proceeded to remove the gallbladder off the hepatic fossa with Bovie cautery. A retrieval bag was then placed in the abdomen and gallbladder placed in the bag. The hepatic fossa was then reexamined and hemostasis was achieved with Bovie cautery and was excellent at this portion of the case. The subhepatic fossa and perihepatic fossa was then irrigated until the effluent was clear. The 11 mm trocar fascia was reapproximated with the Endo Close #1 Vicryl x1. The pneumoperitoneum was evacuated and all trochars removed under direct visulalization. The skin was then closed with 4-0 Monocryl and the skin dressed with Steri-Strips, gauze, and tape. The patient was awaken from general anesthesia and taken to the recovery room in stable condition.  PLAN OF CARE: Admit to inpatient   PATIENT DISPOSITION:  PACU - hemodynamically stable.   Delay start of Pharmacological VTE agent (>24hrs) due to surgical blood loss or risk of bleeding: not applicable

## 2014-03-25 NOTE — Transfer of Care (Signed)
Immediate Anesthesia Transfer of Care Note  Patient: Anna Klein  Procedure(s) Performed: Procedure(s): LAPAROSCOPIC CHOLECYSTECTOMY WITH INTRAOPERATIVE CHOLANGIOGRAM (N/A)  Patient Location: PACU  Anesthesia Type:General  Level of Consciousness: awake, alert  and oriented  Airway & Oxygen Therapy: Patient Spontanous Breathing and Patient connected to nasal cannula oxygen  Post-op Assessment: Report given to PACU RN and Post -op Vital signs reviewed and stable  Post vital signs: Reviewed and stable  Complications: No apparent anesthesia complications

## 2014-03-25 NOTE — Anesthesia Preprocedure Evaluation (Addendum)
Anesthesia Evaluation  Patient identified by MRN, date of birth, ID band Patient awake    Reviewed: Allergy & Precautions, NPO status , Patient's Chart, lab work & pertinent test results  Airway Mallampati: II   Neck ROM: full    Dental   Pulmonary neg pulmonary ROS,          Cardiovascular hypertension, Pt. on medications and Pt. on home beta blockers     Neuro/Psych    GI/Hepatic   Endo/Other    Renal/GU      Musculoskeletal   Abdominal   Peds  Hematology   Anesthesia Other Findings   Reproductive/Obstetrics                            Anesthesia Physical Anesthesia Plan  ASA: II  Anesthesia Plan: General   Post-op Pain Management:    Induction: Intravenous  Airway Management Planned: Oral ETT  Additional Equipment:   Intra-op Plan:   Post-operative Plan: Extubation in OR  Informed Consent: I have reviewed the patients History and Physical, chart, labs and discussed the procedure including the risks, benefits and alternatives for the proposed anesthesia with the patient or authorized representative who has indicated his/her understanding and acceptance.   Dental advisory given  Plan Discussed with: Anesthesiologist and Surgeon  Anesthesia Plan Comments:        Anesthesia Quick Evaluation

## 2014-03-25 NOTE — H&P (Addendum)
[facility] History and Physical  Anna BinetShirley Rufino ZOX:[id number] DOB: [birth year] DOA: 03/24/2014  Referring physician: EDP PCP: Amada JupiterKILROY,RITA M, PA-C   Chief Complaint: Abdominal pain   HPI: Anna Klein is a 69 y.o. female previously healthy, presents to ED with c/o abdominal pain.  Pain has been ongoing for past 5 days.  Usually 4-5 in intensity but goes up to 10/10 intensity at times.  Evaluated x2 in [facility] ED.  Treated with percocet and cipro for possible UTI.  CT scan of abd/pelvis showed a hiatal hernia but not much else per daughters report.  Some improvement temporarily with GI cocktail over at [facility].  Husband died recently but patient and daughters dont think this is related to that.  Has decreased appetite, no vomiting, no diarrhea.  Pain located in epigastric area.  Review of Systems: Systems reviewed.  As above, otherwise negative  Past Medical History  Diagnosis Date  . Hypertension    History reviewed. No pertinent past surgical history. Social History:  reports that she has never smoked. She does not have any smokeless tobacco history on file. She reports that she does not drink alcohol or use illicit drugs.  No Known Allergies  History reviewed. No pertinent family history.   Prior to Admission medications   Medication Sig Start Date End Date Taking? Authorizing Provider  acetaminophen (TYLENOL) 500 MG tablet Take 500 mg by mouth every 6 (six) hours as needed for mild pain.   Yes Historical Provider, MD  ALPRAZolam (XANAX) 0.25 MG tablet Take 0.25 mg by mouth 3 (three) times daily as needed for anxiety.   Yes Historical Provider, MD  atorvastatin (LIPITOR) 20 MG tablet Take 20 mg by mouth daily.   Yes Historical Provider, MD  ciprofloxacin (CIPRO) 250 MG tablet Take 250 mg by mouth 2 (two) times daily. Take for 7 days started medication on 03-23-14   Yes Historical Provider, MD  cyclobenzaprine (FLEXERIL) 10 MG tablet Take 10 mg by mouth at bedtime as needed for  muscle spasms.   Yes Historical Provider, MD  furosemide (LASIX) 20 MG tablet Take 20 mg by mouth daily.   Yes Historical Provider, MD  metoprolol (LOPRESSOR) 100 MG tablet Take 100 mg by mouth 2 (two) times daily.   Yes Historical Provider, MD  ondansetron (ZOFRAN) 4 MG tablet Take 4 mg by mouth every 8 (eight) hours as needed for nausea or vomiting.   Yes Historical Provider, MD  traMADol (ULTRAM) 50 MG tablet Take 50 mg by mouth 3 (three) times daily as needed for moderate pain.   Yes Historical Provider, MD   Physical Exam: Filed Vitals:   03/24/14 2325  BP: 171/75  Pulse: 82  Temp:   Resp: 25    BP 171/75 mmHg  Pulse 82  Temp(Src) 98.9 F (37.2 C) (Oral)  Resp 25  Ht 5\' 1"  (1.549 m)  Wt 68.04 kg (150 lb)  BMI 28.36 kg/m2  SpO2 100%  General Appearance:    Alert, oriented, no distress, appears stated age  Head:    Normocephalic, atraumatic  Eyes:    PERRL, EOMI, sclera non-icteric        Nose:   Nares without drainage or epistaxis. Mucosa, turbinates normal  Throat:   Moist mucous membranes. Oropharynx without erythema or exudate.  Neck:   Supple. No carotid bruits.  No thyromegaly.  No lymphadenopathy.   Back:     No CVA tenderness, no spinal tenderness  Lungs:     Clear to auscultation bilaterally, without  wheezes, rhonchi or rales  Chest wall:    No tenderness to palpitation  Heart:    Regular rate and rhythm without murmurs, gallops, rubs  Abdomen:     Soft, epigastric tenderness, nondistended, normal bowel sounds, no organomegaly  Genitalia:    deferred  Rectal:    deferred  Extremities:   No clubbing, cyanosis or edema.  Pulses:   2+ and symmetric all extremities  Skin:   Skin color, texture, turgor normal, no rashes or lesions  Lymph nodes:   Cervical, supraclavicular, and axillary nodes normal  Neurologic:   CNII-XII intact. Normal strength, sensation and reflexes      throughout    Labs on Admission:  Basic Metabolic Panel:  Recent Labs Lab  03/24/14 1914  NA 131*  K 3.8  CL 96  CO2 20  GLUCOSE 144*  BUN 17  CREATININE 1.21*  CALCIUM 9.3   Liver Function Tests:  Recent Labs Lab 03/24/14 1914  AST 30  ALT 18  ALKPHOS 89  BILITOT 2.6*  PROT 5.9*  ALBUMIN 3.6    Recent Labs Lab 03/24/14 1914  LIPASE 26   No results for input(s): AMMONIA in the last 168 hours. CBC:  Recent Labs Lab 03/24/14 1914  WBC 9.7  NEUTROABS 7.6  HGB 14.5  HCT 42.0  MCV 85.4  PLT 183   Cardiac Enzymes: No results for input(s): CKTOTAL, CKMB, CKMBINDEX, TROPONINI in the last 168 hours.  BNP (last 3 results) No results for input(s): PROBNP in the last 8760 hours. CBG: No results for input(s): GLUCAP in the last 168 hours.  Radiological Exams on Admission: Dg Chest 2 View  03/24/2014   CLINICAL DATA:  Chest pain for 3 days.  EXAM: CHEST  2 VIEW  COMPARISON:  CT scan of March 22, 2014.  FINDINGS: The heart size and mediastinal contours are within normal limits. Both lungs are clear. No pneumothorax or pleural effusion is noted. Compression deformity of lower thoracic vertebral body is noted consistent with old fracture.  IMPRESSION: No acute cardiopulmonary abnormality seen.   Electronically Signed   By: Roque Lias M.D.   On: 03/24/2014 20:01   US Abdomen Complete  03/24/2014   CLINICAL DATA:  Chest pain.  Hypertension.  EXAM: ULTRASOUND ABDOMEN COMPLETE  COMPARISON:  CT abdomen and pelvis 03/22/2014.  FINDINGS: Gallbladder: Multiple stones in the dependent gallbladder, largest measuring 1.2 cm diameter. No gallbladder wall thickening or edema. Murphy's sign is negative.  Common bile duct: Diameter: 3.5 mm, normal  Liver: No focal lesion identified. Within normal limits in parenchymal echogenicity.  IVC: Not visualized due to overlying bowel gas.  Pancreas: Not visualized due to overlying bowel gas.  Spleen: Size and appearance within normal limits.  Right Kidney: Length: 7.9 cm. Mild diffuse parenchymal thinning and increased  echotexture suggesting medical renal disease. No solid mass or hydronephrosis.  Left Kidney: Length: 9.1 cm. Mild diffuse parenchymal thinning and increased echotexture suggesting medical renal disease. No solid mass or hydronephrosis.  Abdominal aorta: Not visualized due to overlying bowel gas.  Other findings: None.  IMPRESSION: Cholelithiasis without additional changes of cholecystitis. Mild renal parenchymal changes suggesting chronic medical renal disease. No hydronephrosis.   Electronically Signed   By: Burman Nieves M.D.   On: 03/24/2014 22:03    EKG: Independently reviewed.  Assessment/Plan Active Problems:   Abdominal pain   Dehydration with hyponatremia   Nausea   1. Abdominal pain - unclear cause 1. Dilaudid PRN 2. Gi cocktail PRN 3.  General surgery eval 1. Gallstones seen on [place] but no ductal dilation, no gallbladder wall thickening, negative Murphy's, no transaminitis. 2. ? Hiatal hernia causing problems (It is at least Type III possibly Type IV on the CT scan (I can see the images from our system but dont have the official read available).  Does have "some" vomiting, would have suspected them to have seen gastric volvulus on the CT scan however. 4. Consider GI eval for diagnostic upper endoscopy 5. Is still the possibility that this could be related to recent loss of her husband, but need to rule out medical pathology first. 2. Dehydration and hyponatremia - IVF, recheck BMP in AM. 3. Nausea and vomiting- zofran PRN  General surgery was called by EDP and is going to evaluate  Code Status: Full Code  Family Communication: Family at bedside Disposition Plan: Admit to obs   Time spent: 70 min  Philmore Lepore M. [facility] Pager [phone]  If 7AM-7PM, please contact the day team taking care of the patient [email or web address] Password [facility] 03/25/2014, 12:35 AM

## 2014-03-25 NOTE — Progress Notes (Signed)
UR completed 

## 2014-03-25 NOTE — Anesthesia Postprocedure Evaluation (Signed)
Anesthesia Post Note  Patient: Anna Klein  Procedure(s) Performed: Procedure(s) (LRB): LAPAROSCOPIC CHOLECYSTECTOMY WITH INTRAOPERATIVE CHOLANGIOGRAM (N/A)  Anesthesia type: General  Patient location: PACU  Post pain: Pain level controlled and Adequate analgesia  Post assessment: Post-op Vital signs reviewed, Patient's Cardiovascular Status Stable, Respiratory Function Stable, Patent Airway and Pain level controlled  Last Vitals:  Filed Vitals:   03/25/14 1600  BP: 142/55  Pulse: 68  Temp:   Resp: 14    Post vital signs: Reviewed and stable  Level of consciousness: awake, alert  and oriented  Complications: No apparent anesthesia complications

## 2014-03-25 NOTE — Progress Notes (Signed)
Patient admitted after midnight by Dr. Julian ReilGardner.  Will get STAT BMP AND troponin to follow up.  Tent plan is for surgery today.  ? If troponin is from HTN.  If continuing to rise, may need cardiology consult. Marlin CanaryJessica Vann DO

## 2014-03-25 NOTE — Progress Notes (Signed)
Received patient from PACU. Pt arousable., but briefly.

## 2014-03-25 NOTE — Consult Note (Signed)
Reason for Consult: Gallstones and abdominal pain Referring Physician: Issabelle Mcraney Klein is an 69 y.o. female.  HPI: Anna Klein developed upper abdominal pain mostly in the right upper quadrant extending to her back 3 days ago. She had associated nausea and vomiting. She was seen in the emergency department at [facility] CT scan abdomen and pelvis was done at that time which showed a hiatal hernia but no other significant abnormalities. She was discharged home but did not improve. [facility] came to [facility] earlier tonight. Further workup included liver function tests demonstrating elevated bilirubin. Abdominal ultrasound demonstrated multiple gallstones. I was asked to see her to consider cholecystectomy. She has been admitted to the hospitalist service. Her daughter is with her and assists with history.  Past Medical History  Diagnosis Date  . Hypertension     History reviewed. No pertinent past surgical history.  History reviewed. No pertinent family history.  Social History:  reports that she has never smoked. She does not have any smokeless tobacco history on file. She reports that she does not drink alcohol or use illicit drugs.  Allergies: No Known Allergies  Medications:  Scheduled: . atorvastatin  20 mg Oral Daily  . ciprofloxacin  400 mg Intravenous Once  . ciprofloxacin  250 mg Oral BID  . fentaNYL  100 mcg Intravenous Once  . heparin  5,000 Units Subcutaneous 3 times per day  . metoprolol  100 mg Oral BID   Continuous: . sodium chloride Stopped (03/24/14 2328)   XBL:[id number], ALPRAZolam, cyclobenzaprine, gi cocktail, HYDROmorphone (DILAUDID) injection, ondansetron **OR** ondansetron (ZOFRAN) IV, traMADol  Results for orders placed or performed during the hospital encounter of 03/24/14 (from the past 48 hour(s))  Urinalysis, Routine w reflex microscopic     Status: Abnormal   Collection Time: 03/24/14  7:09 PM  Result Value Ref Range    Color, Urine YELLOW YELLOW   APPearance CLOUDY (A) CLEAR   Specific Gravity, Urine 1.004 (L) 1.005 - 1.030   pH 7.0 5.0 - 8.0   Glucose, UA NEGATIVE NEGATIVE mg/dL   Hgb urine dipstick NEGATIVE NEGATIVE   Bilirubin Urine NEGATIVE NEGATIVE   Ketones, ur 15 (A) NEGATIVE mg/dL   Protein, ur NEGATIVE NEGATIVE mg/dL   Urobilinogen, UA 0.2 0.0 - 1.0 mg/dL   Nitrite NEGATIVE NEGATIVE   Leukocytes, UA TRACE (A) NEGATIVE  Urine microscopic-add on     Status: None   Collection Time: 03/24/14  7:09 PM  Result Value Ref Range   Squamous Epithelial / LPF RARE RARE   WBC, UA 0-2 <3 WBC/hpf   Bacteria, UA RARE RARE  Comprehensive metabolic panel     Status: Abnormal   Collection Time: 03/24/14  7:14 PM  Result Value Ref Range   Sodium 131 (L) 135 - 145 mmol/L    Comment: Please note change in reference range.   Potassium 3.8 3.5 - 5.1 mmol/L    Comment: Please note change in reference range. SLIGHT HEMOLYSIS    Chloride 96 96 - 112 mEq/L   CO2 20 19 - 32 mmol/L   Glucose, Bld 144 (H) 70 - 99 mg/dL   BUN 17 6 - 23 mg/dL   Creatinine, Ser 1.21 (H) 0.50 - 1.10 mg/dL   Calcium 9.3 8.4 - 10.5 mg/dL   Total Protein 5.9 (L) 6.0 - 8.3 g/dL   Albumin 3.6 3.5 - 5.2 g/dL   AST 30 0 - 37 U/L   ALT 18 0 - 35 U/L  Alkaline Phosphatase 89 39 - 117 U/L   Total Bilirubin 2.6 (H) 0.3 - 1.2 mg/dL   GFR calc non Af Amer 44 (L) >90 mL/min   GFR calc Af Amer 51 (L) >90 mL/min    Comment: (NOTE) The eGFR has been calculated using the CKD EPI equation. This calculation has not been validated in all clinical situations. eGFR's persistently <90 mL/min signify possible Chronic Kidney Disease.    Anion gap 15 5 - 15  CBC with Differential     Status: Abnormal   Collection Time: 03/24/14  7:14 PM  Result Value Ref Range   WBC 9.7 4.0 - 10.5 K/uL    Comment: WHITE COUNT CONFIRMED ON SMEAR   RBC 4.92 3.87 - 5.11 MIL/uL   Hemoglobin 14.5 12.0 - 15.0 g/dL   HCT 42.0 36.0 - 46.0 %   MCV 85.4 78.0 - 100.0  fL   MCH 29.5 26.0 - 34.0 pg   MCHC 34.5 30.0 - 36.0 g/dL   RDW 14.4 11.5 - 15.5 %   Platelets 183 150 - 400 K/uL    Comment: PLATELET COUNT CONFIRMED BY SMEAR REPEATED TO VERIFY SPECIMEN CHECKED FOR CLOTS    Neutrophils Relative % 79 (H) 43 - 77 %   Lymphocytes Relative 15 12 - 46 %   Monocytes Relative 6 3 - 12 %   Eosinophils Relative 0 0 - 5 %   Basophils Relative 0 0 - 1 %   Neutro Abs 7.6 1.7 - 7.7 K/uL   Lymphs Abs 1.5 0.7 - 4.0 K/uL   Monocytes Absolute 0.6 0.1 - 1.0 K/uL   Eosinophils Absolute 0.0 0.0 - 0.7 K/uL   Basophils Absolute 0.0 0.0 - 0.1 K/uL   WBC Morphology ATYPICAL LYMPHOCYTES     Comment: TOXIC GRANULATION VACUOLATED NEUTROPHILS    Smear Review PLATELET CLUMPS NOTED ON SMEAR   Lipase, blood     Status: None   Collection Time: 03/24/14  7:14 PM  Result Value Ref Range   Lipase 26 11 - 59 U/L  D-dimer, quantitative     Status: Abnormal   Collection Time: 03/24/14  7:14 PM  Result Value Ref Range   D-Dimer, Quant 0.50 (H) 0.00 - 0.48 ug/mL-FEU    Comment:        AT THE INHOUSE ESTABLISHED CUTOFF VALUE OF 0.48 ug/mL FEU, THIS ASSAY HAS BEEN DOCUMENTED IN THE LITERATURE TO HAVE A SENSITIVITY AND NEGATIVE PREDICTIVE VALUE OF AT LEAST 98 TO 99%.  THE TEST RESULT SHOULD BE CORRELATED WITH AN ASSESSMENT OF THE CLINICAL PROBABILITY OF DVT / VTE.   CBC     Status: None   Collection Time: 03/25/14  1:20 AM  Result Value Ref Range   WBC 8.2 4.0 - 10.5 K/uL   RBC 4.47 3.87 - 5.11 MIL/uL   Hemoglobin 12.4 12.0 - 15.0 g/dL   HCT 37.2 36.0 - 46.0 %   MCV 83.2 78.0 - 100.0 fL   MCH 27.7 26.0 - 34.0 pg   MCHC 33.3 30.0 - 36.0 g/dL   RDW 14.6 11.5 - 15.5 %   Platelets 187 150 - 400 K/uL    Dg Chest 2 View  03/24/2014   CLINICAL DATA:  Chest pain for 3 days.  EXAM: CHEST  2 VIEW  COMPARISON:  CT scan of March 22, 2014.  FINDINGS: The heart size and mediastinal contours are within normal limits. Both lungs are clear. No pneumothorax or pleural effusion is  noted. Compression deformity of lower  thoracic vertebral body is noted consistent with old fracture.  IMPRESSION: No acute cardiopulmonary abnormality seen.   Electronically Signed   By: Sabino Dick M.D.   On: 03/24/2014 20:01   US Abdomen Complete  03/24/2014   CLINICAL DATA:  Chest pain.  Hypertension.  EXAM: ULTRASOUND ABDOMEN COMPLETE  COMPARISON:  CT abdomen and pelvis 03/22/2014.  FINDINGS: Gallbladder: Multiple stones in the dependent gallbladder, largest measuring 1.2 cm diameter. No gallbladder wall thickening or edema. Murphy's sign is negative.  Common bile duct: Diameter: 3.5 mm, normal  Liver: No focal lesion identified. Within normal limits in parenchymal echogenicity.  IVC: Not visualized due to overlying bowel gas.  Pancreas: Not visualized due to overlying bowel gas.  Spleen: Size and appearance within normal limits.  Right Kidney: Length: 7.9 cm. Mild diffuse parenchymal thinning and increased echotexture suggesting medical renal disease. No solid mass or hydronephrosis.  Left Kidney: Length: 9.1 cm. Mild diffuse parenchymal thinning and increased echotexture suggesting medical renal disease. No solid mass or hydronephrosis.  Abdominal aorta: Not visualized due to overlying bowel gas.  Other findings: None.  IMPRESSION: Cholelithiasis without additional changes of cholecystitis. Mild renal parenchymal changes suggesting chronic medical renal disease. No hydronephrosis.   Electronically Signed   By: Lucienne Capers M.D.   On: 03/24/2014 22:03    Review of Systems  Constitutional: Positive for malaise/fatigue.  HENT: Negative.   Eyes: Negative.   Respiratory: Negative.   Cardiovascular: Negative for chest pain and palpitations.  Gastrointestinal: Positive for nausea, vomiting and abdominal pain.  Genitourinary: Negative.   Musculoskeletal: Negative.   Skin: Negative.   Neurological: Negative.   Endo/Heme/Allergies: Negative.   Psychiatric/Behavioral: Negative.    Blood pressure  171/77, pulse 73, temperature 98.6 F (37 C), temperature source Oral, resp. rate 18, height 5' 1"  (1.549 m), weight 146 lb 13.2 oz (66.6 kg), SpO2 100 %. Physical Exam  Constitutional: She is oriented to person, place, and time. She appears well-developed and well-nourished. No distress.  HENT:  Head: Normocephalic and atraumatic.  Right Ear: External ear normal.  Left Ear: External ear normal.  Nose: Nose normal.  Mouth/Throat: Oropharynx is clear and moist.  Eyes: Conjunctivae and EOM are normal. Pupils are equal, round, and reactive to light. Right eye exhibits no discharge. Left eye exhibits no discharge.  Neck: Normal range of motion. Neck supple. No tracheal deviation present. No thyromegaly present.  Cardiovascular: Normal rate, normal heart sounds and intact distal pulses.   Respiratory: Effort normal and breath sounds normal. No stridor. No respiratory distress. She has no wheezes. She has no rales.  GI: Soft. She exhibits no distension. There is tenderness. There is no rebound and no guarding.  Mild right upper quadrant tenderness without guarding, no generalized tenderness  Musculoskeletal: She exhibits no edema or tenderness.  Neurological: She is alert and oriented to person, place, and time. She exhibits normal muscle tone.  Skin: Skin is warm and dry.  Psychiatric: She has a normal mood and affect.    Assessment/Plan: Symptomatic cholelithiasis and elevated bilirubin - I believe her symptoms are coming from her gallbladder. Recommend repeat liver function tests later this morning. I discussed planning laparoscopic cholecystectomy with clinical exam this admission. Her daughter is concerned that her hiatal hernia is responsible for her symptoms and not her gallbladder. They would like to discuss things further with the rest of our team, including Dr. Georgette Dover prior to deciding about surgery. Continue Cipro, repeat LFTs, NPO. We will re-evaluate later this AM.  Rocket Gunderson  E 03/25/2014, 1:40 AM

## 2014-03-25 NOTE — Progress Notes (Signed)
INITIAL NUTRITION ASSESSMENT  DOCUMENTATION CODES Per approved criteria  -Not Applicable   INTERVENTION: Once diet advances, provide Ensure Complete po BID, each supplement provides 350 kcal and 13 grams of protein.  NUTRITION DIAGNOSIS: Increased nutrient needs related to s/p surgery as evidenced by estimated nutrition needs.   Goal: Pt to meet >/= 90% of their estimated nutrition needs   Monitor:  PO intake, weight trends, labs, I/O's  Reason for Assessment: MST  69 y.o. female  Admitting Dx: Abdominal Pain  ASSESSMENT: Pt presents to ED with c/o abdominal pain. Pain has been ongoing for past 5 days. CT scan of abd/pelvis showed a hiatal hernia .  Pt is currently NPO for surgery, laparoscopic cholecystectomy with intraoperative cholangiogram for acute cholecystitis. Pt reports usually having a great appetite, however up until Monday 1/4 she has had a decreased appetite from her abdominal pains. Pt reports she has still been adequately eating with her usual 5 small meals a day. Weight has been stable with her usual body weight of 145-150 lbs. Pt is agreeable to Ensure once diet advances. RD to order.   Pt with no observed significant fat or muscle mass loss.  Labs: Low sodium and GFR.  Height: Ht Readings from Last 1 Encounters:  03/25/14 5\' 1"  (1.549 m)    Weight: Wt Readings from Last 1 Encounters:  03/25/14 146 lb 13.2 oz (66.6 kg)    Ideal Body Weight: 105 lbs  % Ideal Body Weight: 139 lbs  Wt Readings from Last 10 Encounters:  03/25/14 146 lb 13.2 oz (66.6 kg)    Usual Body Weight: 145-150 lbs  % Usual Body Weight: 100%  BMI:  Body mass index is 27.76 kg/(m^2).  Estimated Nutritional Needs: Kcal: 1800-2000 Protein: 80-90 grams Fluid: 1.8 - 2 L/day  Skin: intact  Diet Order: Diet NPO time specified  EDUCATION NEEDS: -No education needs identified at this time  No intake or output data in the 24 hours ending 03/25/14 1014  Last BM:  1/7  Labs:   Recent Labs Lab 03/24/14 1914 03/25/14 0120 03/25/14 0545  NA 131* 135  --   K 3.8 2.7*  --   CL 96 105  --   CO2 20 23  --   BUN 17 14  --   CREATININE 1.21* 1.17*  --   CALCIUM 9.3 8.6  --   MG  --   --  1.8  GLUCOSE 144* 140*  --     CBG (last 3)  No results for input(s): GLUCAP in the last 72 hours.  Scheduled Meds: . atorvastatin  20 mg Oral Daily  . ciprofloxacin  400 mg Intravenous Q12H  . fentaNYL  100 mcg Intravenous Once  . metoprolol  100 mg Oral BID    Continuous Infusions: . sodium chloride Stopped (03/24/14 2328)    Past Medical History  Diagnosis Date  . Hypertension     History reviewed. No pertinent past surgical history.  Marijean NiemannStephanie La, MS, RD, LDN Pager # [phone] After hours/ weekend pager # [phone]

## 2014-03-25 NOTE — Anesthesia Procedure Notes (Signed)
Procedure Name: Intubation Date/Time: 03/25/2014 2:12 PM Performed by: Leonel Ramsay'LAUGHLIN, Judithann Villamar H Pre-anesthesia Checklist: Patient identified, Emergency Drugs available, Suction available, Patient being monitored and Timeout performed Patient Re-evaluated:Patient Re-evaluated prior to inductionOxygen Delivery Method: Circle system utilized Preoxygenation: Pre-oxygenation with 100% oxygen Intubation Type: IV induction Ventilation: Mask ventilation without difficulty Laryngoscope Size: Mac and 4 Grade View: Grade I Tube type: Oral Tube size: 7.0 mm Number of attempts: 1 Airway Equipment and Method: Stylet Placement Confirmation: ETT inserted through vocal cords under direct vision,  positive ETCO2 and breath sounds checked- equal and bilateral Secured at: 21 cm Tube secured with: Tape Dental Injury: Teeth and Oropharynx as per pre-operative assessment

## 2014-03-25 NOTE — Progress Notes (Signed)
Subjective: Patient very uncomfortable - bloated, nauseated, Epigastric/ RUQ abdominal pain  Objective: Vital signs in last 24 hours: Temp:  [97.5 F (36.4 C)-98.9 F (37.2 C)] 97.5 F (36.4 C) (01/08 0540) Pulse Rate:  [72-94] 72 (01/08 0540) Resp:  [13-26] 16 (01/08 0540) BP: (143-188)/(68-94) 188/80 mmHg (01/08 0540) SpO2:  [98 %-100 %] 100 % (01/08 0540) Weight:  [146 lb 13.2 oz (66.6 kg)-150 lb (68.04 kg)] 146 lb 13.2 oz (66.6 kg) (01/08 0103) Last BM Date: 03/24/14  Intake/Output from previous day:   Intake/Output this shift:    General appearance: alert, cooperative and no distress GI: distended, tender in RUQ/ epigastrium; no palpable masses No jaundice Lab Results:   Recent Labs  03/25/14 0120 03/25/14 0545  WBC 8.2 9.4  HGB 12.4 13.2  HCT 37.2 39.8  PLT 187 186   BMET  Recent Labs  03/24/14 1914 03/25/14 0120  NA 131* 135  K 3.8 2.7*  CL 96 105  CO2 20 23  GLUCOSE 144* 140*  BUN 17 14  CREATININE 1.21* 1.17*  CALCIUM 9.3 8.6   PT/INR No results for input(s): LABPROT, INR in the last 72 hours. ABG No results for input(s): PHART, HCO3 in the last 72 hours.  Invalid input(s): PCO2, PO2  Studies/Results: Dg Chest 2 View  03/24/2014   CLINICAL DATA:  Chest pain for 3 days.  EXAM: CHEST  2 VIEW  COMPARISON:  CT scan of March 22, 2014.  FINDINGS: The heart size and mediastinal contours are within normal limits. Both lungs are clear. No pneumothorax or pleural effusion is noted. Compression deformity of lower thoracic vertebral body is noted consistent with old fracture.  IMPRESSION: No acute cardiopulmonary abnormality seen.   Electronically Signed   By: Roque Lias M.D.   On: 03/24/2014 20:01   US Abdomen Complete  03/24/2014   CLINICAL DATA:  Chest pain.  Hypertension.  EXAM: ULTRASOUND ABDOMEN COMPLETE  COMPARISON:  CT abdomen and pelvis 03/22/2014.  FINDINGS: Gallbladder: Multiple stones in the dependent gallbladder, largest measuring 1.2 cm  diameter. No gallbladder wall thickening or edema. Murphy's sign is negative.  Common bile duct: Diameter: 3.5 mm, normal  Liver: No focal lesion identified. Within normal limits in parenchymal echogenicity.  IVC: Not visualized due to overlying bowel gas.  Pancreas: Not visualized due to overlying bowel gas.  Spleen: Size and appearance within normal limits.  Right Kidney: Length: 7.9 cm. Mild diffuse parenchymal thinning and increased echotexture suggesting medical renal disease. No solid mass or hydronephrosis.  Left Kidney: Length: 9.1 cm. Mild diffuse parenchymal thinning and increased echotexture suggesting medical renal disease. No solid mass or hydronephrosis.  Abdominal aorta: Not visualized due to overlying bowel gas.  Other findings: None.  IMPRESSION: Cholelithiasis without additional changes of cholecystitis. Mild renal parenchymal changes suggesting chronic medical renal disease. No hydronephrosis.   Electronically Signed   By: Burman Nieves M.D.   On: 03/24/2014 22:03    Anti-infectives: Anti-infectives    Start     Dose/Rate Route Frequency Ordered Stop   03/25/14 0015  ciprofloxacin (CIPRO) tablet 250 mg     250 mg Oral 2 times daily 03/25/14 0006     03/25/14 0015  ciprofloxacin (CIPRO) IVPB 400 mg     400 mg200 mL/hr over 60 Minutes Intravenous  Once 03/25/14 0010 03/25/14 0345      Assessment/Plan: s/p * No surgery found * Recommend laparoscopic cholecystectomy with intraoperative cholangiogram for acute cholecystitis First will recheck troponin since last one was  borderline She may have passed a CBD stone since bili down to normal today.  The surgical procedure has been discussed with the patient.  Potential risks, benefits, alternative treatments, and expected outcomes have been explained.  All of the patient's questions at this time have been answered.  The likelihood of reaching the patient's treatment goal is good.  The patient understand the proposed surgical procedure  and wishes to proceed.  Wilmon ArmsMatthew K. Corliss Skainssuei, MD, [facility]  General/ Trauma Surgery  03/25/2014 9:33 AM   LOS: 1 day    Marcile Fuquay K. 03/25/2014

## 2014-03-25 NOTE — Plan of Care (Signed)
Problem: Phase II Progression Outcomes Goal: Foley discontinued Outcome: Not Applicable Date Met:  03/25/14 Pt voids     

## 2014-03-26 DIAGNOSIS — E8779 Other fluid overload: Secondary | ICD-10-CM

## 2014-03-26 DIAGNOSIS — E8770 Fluid overload, unspecified: Secondary | ICD-10-CM | POA: Insufficient documentation

## 2014-03-26 DIAGNOSIS — K810 Acute cholecystitis: Secondary | ICD-10-CM

## 2014-03-26 DIAGNOSIS — M25511 Pain in right shoulder: Secondary | ICD-10-CM

## 2014-03-26 DIAGNOSIS — M25611 Stiffness of right shoulder, not elsewhere classified: Secondary | ICD-10-CM | POA: Diagnosis present

## 2014-03-26 DIAGNOSIS — E876 Hypokalemia: Secondary | ICD-10-CM | POA: Diagnosis present

## 2014-03-26 DIAGNOSIS — R0600 Dyspnea, unspecified: Secondary | ICD-10-CM

## 2014-03-26 LAB — BASIC METABOLIC PANEL
Anion gap: 12 (ref 5–15)
BUN: 11 mg/dL (ref 6–23)
CHLORIDE: 102 meq/L (ref 96–112)
CO2: 20 mmol/L (ref 19–32)
CREATININE: 1.04 mg/dL (ref 0.50–1.10)
Calcium: 8.4 mg/dL (ref 8.4–10.5)
GFR calc non Af Amer: 53 mL/min — ABNORMAL LOW (ref >90)
GFR, EST AFRICAN AMERICAN: 62 mL/min — AB (ref >90)
Glucose, Bld: 116 mg/dL — ABNORMAL HIGH (ref 70–99)
POTASSIUM: 3.3 mmol/L — AB (ref 3.5–5.1)
SODIUM: 134 mmol/L — AB (ref 135–145)

## 2014-03-26 LAB — CBC
HCT: 37.9 % (ref 36.0–46.0)
Hemoglobin: 12.3 g/dL (ref 12.0–15.0)
MCH: 27.7 pg (ref 26.0–34.0)
MCHC: 32.5 g/dL (ref 30.0–36.0)
MCV: 85.4 fL (ref 78.0–100.0)
Platelets: 201 10*3/uL (ref 150–400)
RBC: 4.44 MIL/uL (ref 3.87–5.11)
RDW: 15 % (ref 11.5–15.5)
WBC: 9.4 10*3/uL (ref 4.0–10.5)

## 2014-03-26 LAB — MAGNESIUM: Magnesium: 1.8 mg/dL (ref 1.5–2.5)

## 2014-03-26 LAB — BRAIN NATRIURETIC PEPTIDE: B Natriuretic Peptide: 346.5 pg/mL — ABNORMAL HIGH (ref 0.0–100.0)

## 2014-03-26 MED ORDER — POTASSIUM CHLORIDE CRYS ER 20 MEQ PO TBCR: 40.0000 meq | EXTENDED_RELEASE_TABLET | Freq: Every day | ORAL | Status: DC

## 2014-03-26 MED ORDER — FUROSEMIDE 40 MG PO TABS
40.0000 mg | ORAL_TABLET | Freq: Two times a day (BID) | ORAL | Status: DC
  Administered 2014-03-26 – 2014-03-29 (×7): 40 mg via ORAL
  Filled 2014-03-26 (×8): qty 1

## 2014-03-26 MED ORDER — POTASSIUM CHLORIDE CRYS ER 20 MEQ PO TBCR
80.0000 meq | EXTENDED_RELEASE_TABLET | Freq: Once | ORAL | Status: AC
  Administered 2014-03-26: 80 meq via ORAL
  Filled 2014-03-26: qty 4

## 2014-03-26 MED ORDER — TRAMADOL HCL 50 MG PO TABS
50.0000 mg | ORAL_TABLET | Freq: Four times a day (QID) | ORAL | Status: DC | PRN
  Administered 2014-03-26 – 2014-03-30 (×11): 50 mg via ORAL
  Filled 2014-03-26 (×11): qty 1

## 2014-03-26 MED ORDER — FUROSEMIDE 20 MG PO TABS
20.0000 mg | ORAL_TABLET | Freq: Once | ORAL | Status: AC
  Administered 2014-03-26: 20 mg via ORAL
  Filled 2014-03-26: qty 1

## 2014-03-26 MED ORDER — POTASSIUM CHLORIDE CRYS ER 20 MEQ PO TBCR
40.0000 meq | EXTENDED_RELEASE_TABLET | Freq: Every day | ORAL | Status: DC
  Administered 2014-03-26 – 2014-03-30 (×5): 40 meq via ORAL
  Filled 2014-03-26 (×5): qty 2

## 2014-03-26 NOTE — Progress Notes (Signed)
During the night patient c/o of numbness to feet along with pain under her rub cage. Earlier this morning daughter stated that  the patient was unable to lift her right arm. After assessing the patient patient had som difficulty lifting her arm. Patient was given 1mg  of Dilaudid  Twice during the shift and oftered GI cocktail which will be given. Daughter states that her mom is healthy and can't understand what is going on.

## 2014-03-26 NOTE — Progress Notes (Signed)
PROGRESS NOTE  Anna Klein ZOX:[id number] DOB: [birth year] DOA: 03/24/2014 PCP: Amada Jupiter, PA-C  HPI/Recap of past 24 hours: Patient is 69 year old female with past mental history of hypertension and hyperlipidemia who was admitted to the hospitalist service on 1/7 night for several days of abdominal pain. Seen by surgery and felt to have acute cholecystitis, so patient underwent cholecystectomy done 1/8 without complication.  Today, diet able to be advanced and patient with no abdominal pain. She is however having a number of issues including leg cramping and numbness bilaterally with difficulty walking, dyspnea on exertion and Limited ability to abduct her shoulder. Lab work was noted for hypokalemia. Patient herself otherwise doing okay with no complaints.  Assessment/Plan: Active Problems:   Abdominal pain from acute cholecystitis: Status post cholecystectomy resolved and cleared from surgical standpoint.    Nausea andDehydration with hyponatremia: secondary to cholecystitis, resolved. Some of her complaints of thirst now maybe more medication related see below.    Hypokalemia: Likely secondary to IV fluids, normal saline. Have discontinued this. Magnesium level checked found to be normal. Potassium replaced.   Decreased right shoulder range of motion: Unusual. According to patient who is a good historian,was occurring before she was admitted. I'm able to fully rotate her shoulder and suspect that this possibly could be more of a rotator cuff injury. We'll get physical therapy to see and assess. She does not have significant pain    Volume overload/dyspnea on exertion: New problem that developed post surgery. Suspect patient is volume overloaded. No history of CHF. BNP checked and found to be elevated at 340. Checking echocardiogram and have started Lasix. Patient normally on low dose Lasix at home by by mouth  leg cramping/numbness/weakness: Could be more medication related. Have  discontinued all narcotics, muscle relaxers and benzodiazepines that she was getting when necessary here. Could also be hypokalemia. Do not think that this is stroke related given absence of other findings.physical therapy to see.  Code Status: full code  Family Communication: daughter at the bedside  Disposition Plan: likely here for about 2 more days. Need to get echo and fully diurese. Would also like to see improvement in her lower extremity complaints   Consultants:  General surgery  Procedures:  Status post lap scopic cholecystectomy done 1/9  2-D echo ordered and pending  Antibiotics:  Cipro and Flagyl 1/7-1/9. Patient on previous by mouth Cipro prior to admission   Objective: BP 107/47 mmHg  Pulse 68  Temp(Src) 99.2 F (37.3 C) (Oral)  Resp 18  Ht  (1.549 m)  Wt 66.6 kg (146 lb 13.2 oz)  BMI 27.76 kg/m2  SpO2 79%  Intake/Output Summary (Last 24 hours) at 03/26/14 1711 Last data filed at 03/26/14 1109  Gross per 24 hour  Intake    150 ml  Output   2500 ml  Net  -2350 ml   Filed Weights   03/24/14 1859 03/25/14 0103  Weight: 68.04 kg (150 lb) 66.6 kg (146 lb 13.2 oz)    Exam:   General:  Alert and oriented 3, no acute distress  Cardiovascular: regular rate and rhythm, S1-S2  Respiratory: clear to auscultation bilaterally  Abdomen: soft, nontender, nondistended, hypoactive bowel sounds  Musculoskeletal: no clubbing or cyanosis or edema.  Able to fully rotate right shoulder and patient able to rotate upwards. She has difficulty with abduction without use of accessory shoulder muscles  Neuro: no focal deficits. Downgoing toes. No dysmetria. Strength with grip, flexion and extension 5/5 symmetric  Data  Reviewed: Basic Metabolic Panel:  Recent Labs Lab 03/24/14 1914 03/25/14 0120 03/25/14 0545 03/25/14 1013 03/26/14 0459 03/26/14 1320  NA 131* 135  --  134* 134*  --   Klein 3.8 2.7*  --  3.9 3.3*  --   CL 96 105  --  103 102  --   CO2 20  23  --  24 20  --   GLUCOSE 144* 140*  --  151* 116*  --   BUN 17 14  --  12 11  --   CREATININE 1.21* 1.17*  --  1.10 1.04  --   CALCIUM 9.3 8.6  --  8.6 8.4  --   MG  --   --  1.8  --   --  1.8   Liver Function Tests:  Recent Labs Lab 03/24/14 1914 03/25/14 0545  AST 30 53*  ALT 18 24  ALKPHOS 89 82  BILITOT 2.6* 0.9  PROT 5.9* 5.9*  ALBUMIN 3.6 3.4*    Recent Labs Lab 03/24/14 1914  LIPASE 26   No results for input(s): AMMONIA in the last 168 hours. CBC:  Recent Labs Lab 03/24/14 1914 03/25/14 0120 03/25/14 0545 03/26/14 1159  WBC 9.7 8.2 9.4 9.4  NEUTROABS 7.6  --  7.4  --   HGB 14.5 12.4 13.2 12.3  HCT 42.0 37.2 39.8 37.9  MCV 85.4 83.2 84.1 85.4  PLT 183 187 186 201   Cardiac Enzymes:    Recent Labs Lab 03/25/14 0120 03/25/14 1013  TROPONINI 0.04* 0.03   BNP (last 3 results) No results for input(s): PROBNP in the last 8760 hours. CBG: No results for input(s): GLUCAP in the last 168 hours.  Recent Results (from the past 240 hour(s))  Urine culture     Status: None   Collection Time: 03/24/14  7:09 PM  Result Value Ref Range Status   Specimen Description URINE, CLEAN CATCH  Final   Special Requests Normal  Final   Colony Count   Final    3,000 COLONIES/ML Performed at [facility]    Culture   Final    INSIGNIFICANT GROWTH Performed at [facility]    Report Status 03/25/2014 FINAL  Final  Surgical pcr screen     Status: Abnormal   Collection Time: 03/25/14 11:19 AM  Result Value Ref Range Status   MRSA, PCR NEGATIVE NEGATIVE Final   Staphylococcus aureus POSITIVE (A) NEGATIVE Final    Comment:        The Xpert SA Assay (FDA approved for NASAL specimens in patients over 23 years of age), is one component of a comprehensive surveillance program.  Test performance has been validated by [employer] for patients greater than or equal to 47 year old. It is not intended to diagnose infection nor to guide or monitor  treatment.      Studies: Dg Cholangiogram Operative  03/25/2014   CLINICAL DATA:  69 year old female with cholecystectomy.  EXAM: INTRAOPERATIVE CHOLANGIOGRAM  COMPARISON:  CT 03/22/2014  FINDINGS: Limited intraoperative fluoroscopic spot images of the upper abdomen during cholecystectomy.  Images demonstrate cannulation of the cystic duct at the level of surgical clips.  There is antegrade infusion through the cystic duct, with partial opacification of intrahepatic ducts at the liver hilum. Contrast fills the common hepatic duct and common bile duct, as well as partially within the pancreatic duct.  Contrast flows cross the ampulla into the duodenum.  There are a few is rounded mobile filling defects of  the common hepatic duct.  No extraluminal contrast.  Surgical sponge projects over the upper abdomen. Retained contrast within the colon.  IMPRESSION: Intraoperative cholangiogram demonstrates cannulation of the cystic duct, and antegrade infusion of contrast into the common hepatic duct, common bile duct, and across the ampulla. Contrast enters the duodenum, with unremarkable caliber of the extrahepatic ducts.  Mobile filling defects of the common hepatic duct may represent air bubbles or debris.  Surgical sponge projects over the midline.  Please refer to the dictated operative report for full details of intraoperative findings and procedure.  Signed,  Yvone NeuJaime S. Loreta AveWagner, DO  [facility]  [facility]   Electronically Signed   By: Gilmer MorJaime  Wagner D.O.   On: 03/25/2014 15:35    Scheduled Meds: . atorvastatin  20 mg Oral Daily  . Chlorhexidine Gluconate Cloth  6 each Topical Daily  . enoxaparin (LOVENOX) injection  40 mg Subcutaneous Q24H  . fentaNYL  100 mcg Intravenous Once  . furosemide  40 mg Oral BID  . metoprolol  100 mg Oral BID  . mupirocin ointment  1 application Nasal BID  . potassium chloride  40 mEq Oral Daily    Continuous Infusions:    Time  spent: 35 minutes  Hollice Klein,Anna Klein  [facility] Pager [phone]. If 7PM-7AM, please contact night-coverage at [email or web address], password [email or web address] 03/26/2014, 5:11 PM  LOS: 2 days

## 2014-03-26 NOTE — Progress Notes (Signed)
1 Day Post-Op  Subjective: Tolerating clear liquids.  Not much abdominal pain.  Weak and short of breath when walking  Objective: Vital signs in last 24 hours: Temp:  [97.4 F (36.3 C)-99.8 F (37.7 C)] 97.7 F (36.5 C) (01/09 0637) Pulse Rate:  [67-80] 74 (01/09 0805) Resp:  [9-20] 18 (01/09 0637) BP: (100-218)/(36-92) 157/70 mmHg (01/09 0805) SpO2:  [96 %-100 %] 96 % (01/09 0805) Last BM Date: 03/24/14  Intake/Output from previous day: 01/08 0701 - 01/09 0700 In: 800 [I.V.:800] Out: 1225 [Urine:1200; Blood:25] Intake/Output this shift: Total I/O In: -  Out: 1000 [Urine:1000]  PE: General- In NAD Abdomen-soft, dressings dry  Lab Results:   Recent Labs  03/25/14 0120 03/25/14 0545  WBC 8.2 9.4  HGB 12.4 13.2  HCT 37.2 39.8  PLT 187 186   BMET  Recent Labs  03/25/14 1013 03/26/14 0459  NA 134* 134*  K 3.9 3.3*  CL 103 102  CO2 24 20  GLUCOSE 151* 116*  BUN 12 11  CREATININE 1.10 1.04  CALCIUM 8.6 8.4   PT/INR No results for input(s): LABPROT, INR in the last 72 hours. Comprehensive Metabolic Panel:    Component Value Date/Time   NA 134* 03/26/2014 0459   NA 134* 03/25/2014 1013   K 3.3* 03/26/2014 0459   K 3.9 03/25/2014 1013   CL 102 03/26/2014 0459   CL 103 03/25/2014 1013   CO2 20 03/26/2014 0459   CO2 24 03/25/2014 1013   BUN 11 03/26/2014 0459   BUN 12 03/25/2014 1013   CREATININE 1.04 03/26/2014 0459   CREATININE 1.10 03/25/2014 1013   GLUCOSE 116* 03/26/2014 0459   GLUCOSE 151* 03/25/2014 1013   CALCIUM 8.4 03/26/2014 0459   CALCIUM 8.6 03/25/2014 1013   AST 53* 03/25/2014 0545   AST 30 03/24/2014 1914   ALT 24 03/25/2014 0545   ALT 18 03/24/2014 1914   ALKPHOS 82 03/25/2014 0545   ALKPHOS 89 03/24/2014 1914   BILITOT 0.9 03/25/2014 0545   BILITOT 2.6* 03/24/2014 1914   PROT 5.9* 03/25/2014 0545   PROT 5.9* 03/24/2014 1914   ALBUMIN 3.4* 03/25/2014 0545   ALBUMIN 3.6 03/24/2014 1914     Studies/Results: Dg Chest 2  View  03/24/2014   CLINICAL DATA:  Chest pain for 3 days.  EXAM: CHEST  2 VIEW  COMPARISON:  CT scan of March 22, 2014.  FINDINGS: The heart size and mediastinal contours are within normal limits. Both lungs are clear. No pneumothorax or pleural effusion is noted. Compression deformity of lower thoracic vertebral body is noted consistent with old fracture.  IMPRESSION: No acute cardiopulmonary abnormality seen.   Electronically Signed   By: Roque Lias M.D.   On: 03/24/2014 20:01   Dg Cholangiogram Operative  03/25/2014   CLINICAL DATA:  69 year old female with cholecystectomy.  EXAM: INTRAOPERATIVE CHOLANGIOGRAM  COMPARISON:  CT 03/22/2014  FINDINGS: Limited intraoperative fluoroscopic spot images of the upper abdomen during cholecystectomy.  Images demonstrate cannulation of the cystic duct at the level of surgical clips.  There is antegrade infusion through the cystic duct, with partial opacification of intrahepatic ducts at the liver hilum. Contrast fills the common hepatic duct and common bile duct, as well as partially within the pancreatic duct.  Contrast flows cross the ampulla into the duodenum.  There are a few is rounded mobile filling defects of the common hepatic duct.  No extraluminal contrast.  Surgical sponge projects over the upper abdomen. Retained contrast within the colon.  IMPRESSION: Intraoperative cholangiogram demonstrates cannulation of the cystic duct, and antegrade infusion of contrast into the common hepatic duct, common bile duct, and across the ampulla. Contrast enters the duodenum, with unremarkable caliber of the extrahepatic ducts.  Mobile filling defects of the common hepatic duct may represent air bubbles or debris.  Surgical sponge projects over the midline.  Please refer to the dictated operative report for full details of intraoperative findings and procedure.  Signed,  Yvone NeuJaime S. Loreta AveWagner, DO  [facility]  [facility]    Electronically Signed   By: Gilmer MorJaime  Wagner D.O.   On: 03/25/2014 15:35   [place] Abdomen Complete  03/24/2014   CLINICAL DATA:  Chest pain.  Hypertension.  EXAM: ULTRASOUND ABDOMEN COMPLETE  COMPARISON:  CT abdomen and pelvis 03/22/2014.  FINDINGS: Gallbladder: Multiple stones in the dependent gallbladder, largest measuring 1.2 cm diameter. No gallbladder wall thickening or edema. Murphy's sign is negative.  Common bile duct: Diameter: 3.5 mm, normal  Liver: No focal lesion identified. Within normal limits in parenchymal echogenicity.  IVC: Not visualized due to overlying bowel gas.  Pancreas: Not visualized due to overlying bowel gas.  Spleen: Size and appearance within normal limits.  Right Kidney: Length: 7.9 cm. Mild diffuse parenchymal thinning and increased echotexture suggesting medical renal disease. No solid mass or hydronephrosis.  Left Kidney: Length: 9.1 cm. Mild diffuse parenchymal thinning and increased echotexture suggesting medical renal disease. No solid mass or hydronephrosis.  Abdominal aorta: Not visualized due to overlying bowel gas.  Other findings: None.  IMPRESSION: Cholelithiasis without additional changes of cholecystitis. Mild renal parenchymal changes suggesting chronic medical renal disease. No hydronephrosis.   Electronically Signed   By: Burman NievesWilliam  Stevens M.D.   On: 03/24/2014 22:03    Anti-infectives: Anti-infectives    Start     Dose/Rate Route Frequency Ordered Stop   03/25/14 1330  ceFAZolin (ANCEF) IVPB 2 g/50 mL premix     2 g100 mL/hr over 30 Minutes Intravenous  Once 03/25/14 1318 03/25/14 1415   03/25/14 1000  ciprofloxacin (CIPRO) IVPB 400 mg     400 mg200 mL/hr over 60 Minutes Intravenous Every 12 hours 03/25/14 0935     03/25/14 0015  ciprofloxacin (CIPRO) tablet 250 mg  Status:  Discontinued     250 mg Oral 2 times daily 03/25/14 0006 03/25/14 0935   03/25/14 0015  ciprofloxacin (CIPRO) IVPB 400 mg     400 mg200 mL/hr over 60 Minutes Intravenous  Once 03/25/14 0010  03/25/14 0345      Assessment S/p lap chole for cholecystitis 03/25/14-stable from this standpoint Weakness and dyspnea when walking-SaO2 normal on room air    LOS: 2 days   Plan: Advance to bland diet.  Dyspnea per IM.   Mikail Goostree J 03/26/2014

## 2014-03-26 NOTE — Progress Notes (Signed)
Utilization Review completed.  

## 2014-03-27 DIAGNOSIS — K5900 Constipation, unspecified: Secondary | ICD-10-CM

## 2014-03-27 DIAGNOSIS — G629 Polyneuropathy, unspecified: Secondary | ICD-10-CM

## 2014-03-27 LAB — BASIC METABOLIC PANEL
Anion gap: 8 (ref 5–15)
BUN: 12 mg/dL (ref 6–23)
CO2: 28 mmol/L (ref 19–32)
Calcium: 9.4 mg/dL (ref 8.4–10.5)
Chloride: 102 mEq/L (ref 96–112)
Creatinine, Ser: 1.18 mg/dL — ABNORMAL HIGH (ref 0.50–1.10)
GFR calc non Af Amer: 46 mL/min — ABNORMAL LOW (ref >90)
GFR, EST AFRICAN AMERICAN: 53 mL/min — AB (ref >90)
GLUCOSE: 139 mg/dL — AB (ref 70–99)
Potassium: 3.3 mmol/L — ABNORMAL LOW (ref 3.5–5.1)
Sodium: 138 mmol/L (ref 135–145)

## 2014-03-27 LAB — CBC
HCT: 40.7 % (ref 36.0–46.0)
Hemoglobin: 13.3 g/dL (ref 12.0–15.0)
MCH: 28.1 pg (ref 26.0–34.0)
MCHC: 32.7 g/dL (ref 30.0–36.0)
MCV: 85.9 fL (ref 78.0–100.0)
PLATELETS: 228 10*3/uL (ref 150–400)
RBC: 4.74 MIL/uL (ref 3.87–5.11)
RDW: 15.3 % (ref 11.5–15.5)
WBC: 7.7 10*3/uL (ref 4.0–10.5)

## 2014-03-27 LAB — URIC ACID: Uric Acid, Serum: 6 mg/dL (ref 2.4–7.0)

## 2014-03-27 MED ORDER — POTASSIUM CHLORIDE CRYS ER 20 MEQ PO TBCR
40.0000 meq | EXTENDED_RELEASE_TABLET | Freq: Once | ORAL | Status: AC
  Administered 2014-03-27: 40 meq via ORAL
  Filled 2014-03-27: qty 2

## 2014-03-27 MED ORDER — DOCUSATE SODIUM 100 MG PO CAPS
100.0000 mg | ORAL_CAPSULE | Freq: Two times a day (BID) | ORAL | Status: DC
  Administered 2014-03-27 – 2014-03-30 (×6): 100 mg via ORAL
  Filled 2014-03-27 (×8): qty 1

## 2014-03-27 MED ORDER — GABAPENTIN 100 MG PO CAPS
100.0000 mg | ORAL_CAPSULE | Freq: Two times a day (BID) | ORAL | Status: DC
  Administered 2014-03-27 – 2014-03-30 (×7): 100 mg via ORAL
  Filled 2014-03-27 (×8): qty 1

## 2014-03-27 MED ORDER — POLYETHYLENE GLYCOL 3350 17 G PO PACK
17.0000 g | PACK | Freq: Every day | ORAL | Status: DC
  Administered 2014-03-27 – 2014-03-29 (×3): 17 g via ORAL
  Filled 2014-03-27 (×5): qty 1

## 2014-03-27 MED ORDER — ZOLPIDEM TARTRATE 5 MG PO TABS
5.0000 mg | ORAL_TABLET | Freq: Once | ORAL | Status: AC
  Administered 2014-03-27: 5 mg via ORAL
  Filled 2014-03-27: qty 1

## 2014-03-27 MED ORDER — FLEET ENEMA 7-19 GM/118ML RE ENEM
1.0000 | ENEMA | Freq: Once | RECTAL | Status: AC
  Administered 2014-03-27: 1 via RECTAL
  Filled 2014-03-27: qty 1

## 2014-03-27 NOTE — Progress Notes (Addendum)
Pt family has noticed that pt has periods of apnea no longer than 2-3 seconds during sleep.

## 2014-03-27 NOTE — Progress Notes (Signed)
2 Days Post-Op  Subjective: Feels much better overall.  Breathing easier. Objective: Vital signs in last 24 hours: Temp:  [99.1 F (37.3 C)-99.5 F (37.5 C)] 99.1 F (37.3 C) (01/10 0533) Pulse Rate:  [68-95] 80 (01/10 0639) Resp:  [17-18] 17 (01/10 0533) BP: (92-148)/(47-77) 148/77 mmHg (01/10 0639) SpO2:  [79 %-100 %] 99 % (01/10 0533) Weight:  [147 lb 11.3 oz (67 kg)-149 lb 7.6 oz (67.8 kg)] 149 lb 7.6 oz (67.8 kg) (01/10 0533) Last BM Date: 03/21/14  Intake/Output from previous day: 01/09 0701 - 01/10 0700 In: 520 [P.O.:520] Out: 5475 [Urine:5475] Intake/Output this shift:    PE: General- In NAD Abdomen-soft, incisions are clean, dry, and intact  Lab Results:   Recent Labs  03/26/14 1159 03/27/14 0525  WBC 9.4 7.7  HGB 12.3 13.3  HCT 37.9 40.7  PLT 201 228   BMET  Recent Labs  03/26/14 0459 03/27/14 0525  NA 134* 138  K 3.3* 3.3*  CL 102 102  CO2 20 28  GLUCOSE 116* 139*  BUN 11 12  CREATININE 1.04 1.18*  CALCIUM 8.4 9.4   PT/INR No results for input(s): LABPROT, INR in the last 72 hours. Comprehensive Metabolic Panel:    Component Value Date/Time   NA 138 03/27/2014 0525   NA 134* 03/26/2014 0459   K 3.3* 03/27/2014 0525   K 3.3* 03/26/2014 0459   CL 102 03/27/2014 0525   CL 102 03/26/2014 0459   CO2 28 03/27/2014 0525   CO2 20 03/26/2014 0459   BUN 12 03/27/2014 0525   BUN 11 03/26/2014 0459   CREATININE 1.18* 03/27/2014 0525   CREATININE 1.04 03/26/2014 0459   GLUCOSE 139* 03/27/2014 0525   GLUCOSE 116* 03/26/2014 0459   CALCIUM 9.4 03/27/2014 0525   CALCIUM 8.4 03/26/2014 0459   AST 53* 03/25/2014 0545   AST 30 03/24/2014 1914   ALT 24 03/25/2014 0545   ALT 18 03/24/2014 1914   ALKPHOS 82 03/25/2014 0545   ALKPHOS 89 03/24/2014 1914   BILITOT 0.9 03/25/2014 0545   BILITOT 2.6* 03/24/2014 1914   PROT 5.9* 03/25/2014 0545   PROT 5.9* 03/24/2014 1914   ALBUMIN 3.4* 03/25/2014 0545   ALBUMIN 3.6 03/24/2014 1914      Studies/Results: Dg Cholangiogram Operative  03/25/2014   CLINICAL DATA:  69 year old female with cholecystectomy.  EXAM: INTRAOPERATIVE CHOLANGIOGRAM  COMPARISON:  CT 03/22/2014  FINDINGS: Limited intraoperative fluoroscopic spot images of the upper abdomen during cholecystectomy.  Images demonstrate cannulation of the cystic duct at the level of surgical clips.  There is antegrade infusion through the cystic duct, with partial opacification of intrahepatic ducts at the liver hilum. Contrast fills the common hepatic duct and common bile duct, as well as partially within the pancreatic duct.  Contrast flows cross the ampulla into the duodenum.  There are a few is rounded mobile filling defects of the common hepatic duct.  No extraluminal contrast.  Surgical sponge projects over the upper abdomen. Retained contrast within the colon.  IMPRESSION: Intraoperative cholangiogram demonstrates cannulation of the cystic duct, and antegrade infusion of contrast into the common hepatic duct, common bile duct, and across the ampulla. Contrast enters the duodenum, with unremarkable caliber of the extrahepatic ducts.  Mobile filling defects of the common hepatic duct may represent air bubbles or debris.  Surgical sponge projects over the midline.  Please refer to the dictated operative report for full details of intraoperative findings and procedure.  Signed,  Yvone NeuJaime S. Loreta AveWagner, DO  [facility]  [facility]   Electronically Signed   By: Gilmer Mor D.O.   On: 03/25/2014 15:35    Anti-infectives: Anti-infectives    Start     Dose/Rate Route Frequency Ordered Stop   03/25/14 1330  ceFAZolin (ANCEF) IVPB 2 g/50 mL premix     2 g100 mL/hr over 30 Minutes Intravenous  Once 03/25/14 1318 03/25/14 1415   03/25/14 1000  ciprofloxacin (CIPRO) IVPB 400 mg  Status:  Discontinued     400 mg200 mL/hr over 60 Minutes Intravenous Every 12 hours 03/25/14 0935 03/26/14 1307    03/25/14 0015  ciprofloxacin (CIPRO) tablet 250 mg  Status:  Discontinued     250 mg Oral 2 times daily 03/25/14 0006 03/25/14 0935   03/25/14 0015  ciprofloxacin (CIPRO) IVPB 400 mg     400 mg200 mL/hr over 60 Minutes Intravenous  Once 03/25/14 0010 03/25/14 0345      Assessment S/p lap chole for cholecystitis 03/25/14-stable from this standpoint Weakness and dyspnea when walking-improved.    LOS: 3 days   Plan: Okay for discharge from our standpoint when medical issues are addressed.  Follow up with Dr. Derrell Lolling in 2 weeks.     Ivyonna Hoelzel J 03/27/2014

## 2014-03-27 NOTE — Progress Notes (Signed)
UR completed 

## 2014-03-27 NOTE — Discharge Instructions (Addendum)
[facility] LAPAROSCOP[facility] GALLBLADDER SURGERY: POST OP INSTRUCTIONS Always review your discharge instruction sheet given to you by the facility where your surgery was performed. IF YOU HAVE DISABILITY OR FAMILY LEAVE FORMS, YOU MUST BRING THEM TO THE OFFICE FOR PROCESSING.   DO NOT GIVE THEM TO YOUR DOCTOR.  1. A prescription for pain medication may be given to you upon discharge.  Take your pain medication as prescribed, if needed.  If narcotic pain medicine is not needed, then you may take acetaminophen (Tylenol) or ibuprofen (Advil) as needed. 2. Take your usually prescribed medications unless otherwise directed. 3. If you need a refill on your pain medication, please contact your pharmacy.  They will contact our office to request authorization. Prescriptions will not be filled after 5pm or on week-ends. 4. You should follow a lowfat diet. 5. Most patients will experience some swelling and bruising in the area of the incisions.  Ice packs will help.  Swelling and bruising can take several days to resolve.  6. It is common to experience some constipation if taking pain medication after surgery.  Increasing fluid intake and taking a stool softener (such as Colace) will usually help or prevent this problem from occurring.  A mild laxative (Milk of Magnesia or Miralax) should be taken according to package instructions if there are no bowel movements after 48 hours. 7. Unless discharge instructions indicate otherwise, you may remove your bandages 24-48 hours after surgery, and you may shower at that time.  You may have steri-strips (small skin tapes) in place directly over the incision.  These strips should be left on the skin for 7-10 days.  If your surgeon used skin glue on the incision, you may shower in 24 hours.  The glue will flake off over the next 2-3 weeks.  Any sutures or staples will be removed at the office during your follow-up visit. 8. ACTIVITIES:  You may resume  regular (light) daily activities beginning the next day--such as daily self-care, walking, climbing stairs--gradually increasing activities as tolerated.  You may have sexual intercourse when it is comfortable.  Refrain from any heavy lifting or straining for 2 weeks.  a. You may drive when you are no longer taking prescription pain medication, you can comfortably wear a seatbelt, and you can safely maneuver your car and apply brakes. b. RETURN TO WORK:  __________________________________________________________ 9. You should see your doctor (Dr. Derrell Lolling) in the office for a follow-up appointment approximately 2-3 weeks after your surgery.  Make sure that you call for this appointment within a day or two after you arrive home to insure a convenient appointment time. 10. OTHER INSTRUCTIONS: __________________________________________________________________________________________________________________________ __________________________________________________________________________________________________________________________ WHEN TO CALL YOUR DOCTOR: 1. Fever over 101.0 2. Inability to urinate 3. Continued bleeding from incision. 4. Increased pain, redness, or drainage from the incision. 5. Increasing abdominal pain  The clinic staff is available to answer your questions during regular business hours.  Please dont hesitate to call and ask to speak to one of the nurses for clinical concerns.  If you have a medical emergency, go to the nearest emergency room or call 911.  A surgeon from [facility] is always on call at the hospital. [address], [address], [place], [place]  [address] ? [address] [address], [place], [place]   [address] [phone] ? [phone] ? FAX [phone] Web site: [email or web address]    Peripheral Neuropathy Peripheral neuropathy is a type of nerve damage. It affects nerves that carry signals  between the spinal cord and other parts of the  body. These are called peripheral nerves. With peripheral neuropathy, one nerve or a group of nerves may be damaged.  CAUSES  Many things can damage peripheral nerves. For some people with peripheral neuropathy, the cause is unknown. Some causes include:  Diabetes. This is the most common cause of peripheral neuropathy.  Injury to a nerve.  Pressure or stress on a nerve that lasts a long time.  Too little vitamin B. Alcoholism can lead to this.  Infections.  Autoimmune diseases, such as multiple sclerosis and systemic lupus erythematosus.  Inherited nerve diseases.  Some medicines, such as cancer drugs.  Toxic substances, such as lead and mercury.  Too little blood flowing to the legs.  Kidney disease.  Thyroid disease. SIGNS AND SYMPTOMS  Different people have different symptoms. The symptoms you have will depend on which of your nerves is damaged. Common symptoms include:  Loss of feeling (numbness) in the feet and hands.  Tingling in the feet and hands.  Pain that burns.  Very sensitive skin.  Weakness.  Not being able to move a part of the body (paralysis).  Muscle twitching.  Clumsiness or poor coordination.  Loss of balance.  Not being able to control your bladder.  Feeling dizzy.  Sexual problems. DIAGNOSIS  Peripheral neuropathy is a symptom, not a disease. Finding the cause of peripheral neuropathy can be hard. To figure that out, your health care provider will take a medical history and do a physical exam. A neurological exam will also be done. This involves checking things affected by your brain, spinal cord, and nerves (nervous system). For example, your health care provider will check your reflexes, how you move, and what you can feel.  Other types of tests may also be ordered, such as:  Blood tests.  A test of the fluid in your spinal cord.  Imaging tests, such as CT scans or an MRI.  Electromyography (EMG). This test checks the nerves  that control muscles.  Nerve conduction velocity tests. These tests check how fast messages pass through your nerves.  Nerve biopsy. A small piece of nerve is removed. It is then checked under a microscope. TREATMENT   Medicine is often used to treat peripheral neuropathy. Medicines may include:  Pain-relieving medicines. Prescription or over-the-counter medicine may be suggested.  Antiseizure medicine. This may be used for pain.  Antidepressants. These also may help ease pain from neuropathy.  Lidocaine. This is a numbing medicine. You might wear a patch or be given a shot.  Mexiletine. This medicine is typically used to help control irregular heart rhythms.  Surgery. Surgery may be needed to relieve pressure on a nerve or to destroy a nerve that is causing pain.  Physical therapy to help movement.  Assistive devices to help movement. HOME CARE INSTRUCTIONS   Only take over-the-counter or prescription medicines as directed by your health care provider. Follow the instructions carefully for any given medicines. Do not take any other medicines without first getting approval from your health care provider.  If you have diabetes, work closely with your health care provider to keep your blood sugar under control.  If you have numbness in your feet:  Check every day for signs of injury or infection. Watch for redness, warmth, and swelling.  Wear padded socks and comfortable shoes. These help protect your feet.  Do not do things that put pressure on your damaged nerve.  Do not smoke. Smoking keeps blood  from getting to damaged nerves.  Avoid or limit alcohol. Too much alcohol can cause a lack of B vitamins. These vitamins are needed for healthy nerves.  Develop a good support system. Coping with peripheral neuropathy can be stressful. Talk to a mental health specialist or join a support group if you are struggling.  Follow up with your health care provider as directed. SEEK  MEDICAL CARE IF:   You have new signs or symptoms of peripheral neuropathy.  You are struggling emotionally from dealing with peripheral neuropathy.  You have a fever. SEEK IMMEDIATE MEDICAL CARE IF:   You have an injury or infection that is not healing.  You feel very dizzy or begin vomiting.  You have chest pain.  You have trouble breathing. Document Released: 02/22/2002 Document Revised: 11/14/2010 Document Reviewed: 11/09/2012 [facility] Patient Information 2015 [place], [place]. This information is not intended to replace advice given to you by your health care provider. Make sure you discuss any questions you have with your health care provider.

## 2014-03-27 NOTE — Progress Notes (Signed)
Physical Therapy Evaluation Patient Details Name: Anna Klein MRN: [id number] DOB: 21-Mar-1945 Today's Date: 03/27/2014   History of Present Illness  Patient is a 69 yo female admitted 03/24/14 with abdominal pain, now s/p cholecystectomy.  Patient had an assisted fall at home pta.  Initially had decreased ROM Rt shoulder (resolved).  Patient now with new LE weakness and decreased sensation bil feet.  PMH:  HTN, HLD  Clinical Impression  Patient presents with problems listed below.  Will benefit from acute PT to maximize functional mobility prior to discharge.  Patient with new weakness in BLE's and decreased sensation (light touch and proprioception) bil feet.  These issues are impacting patient's mobility and gait.  Now requires mod assist to transfer to [facility].  Requires mod assist to ambulate 20' with RW with poor balance/fall risk.  Patient was independent pta.  Recommend Inpatient Rehab consult for comprehensive therapies to return patient to highest functional level prior to return home.    Follow Up Recommendations CIR;Supervision/Assistance - 24 hour    Equipment Recommendations  Rolling walker with 5" wheels;3in1 (PT);Wheelchair (measurements PT);Wheelchair cushion (measurements PT)    Recommendations for Other Services Rehab consult     Precautions / Restrictions Precautions Precautions: Fall Restrictions Weight Bearing Restrictions: No      Mobility  Bed Mobility               General bed mobility comments: Patient in chair as PT entered room  Transfers Overall transfer level: Needs assistance Equipment used: Rolling walker (2 wheeled) Transfers: Sit to/from [employer] Sit to Stand: Mod assist Stand pivot transfers: Mod assist       General transfer comment: Verbal cues for hand placement.  Assist to rise to standing and for balance.  Requires support from BUE's to maintain balance.  Mod assist to pivot to [place].  Requires assist to control descent  onto [facility] and into chair.  Ambulation/Gait Ambulation/Gait assistance: Mod assist;+2 safety/equipment Ambulation Distance (Feet): 20 Feet Assistive device: Rolling walker (2 wheeled) Gait Pattern/deviations: Step-through pattern;Decreased stride length;Decreased dorsiflexion - right;Decreased dorsiflexion - left;Leaning posteriorly Gait velocity: Decreased Gait velocity interpretation: Below normal speed for age/gender General Gait Details: Verbal cues for safe use of RW.  Assist to maneuver RW at times.  Very poor balance even with use of RW.  Patient with decreased stability of LE's in stance, with knees buckling Lt > Rt.  Patient with decreased control of LE's with swing phase of gait, unable to control where feet hit the floor.  At times, LE's cross and at other times, patient with wide stance.  Noted decreased DF during swing phase, with feet dragging floor at times.  Stairs            Wheelchair Mobility    Modified Rankin (Stroke Patients Only)       Balance Overall balance assessment: Needs assistance         Standing balance support: Bilateral upper extremity supported Standing balance-Leahy Scale: Poor                               Pertinent Vitals/Pain Pain Assessment: No/denies pain    Home Living Family/patient expects to be discharged to:: Private residence Living Arrangements: Alone Available Help at Discharge: Family;Available PRN/intermittently (Daughter works) Type of Home: House Home Access: Stairs to enter Entrance Stairs-Rails: Right Entrance Stairs-Number of Steps: 11 Home Layout: One level Home Equipment: None  Prior Function Level of Independence: Independent         Comments: Patient was very active pta.  Had no trouble managing 11 stairs to get into home.  Drives.     Hand Dominance        Extremity/Trunk Assessment   Upper Extremity Assessment: RUE deficits/detail;LUE deficits/detail RUE Deficits / Details:  Patient with good ROM in shoulder.  No pain.  Strength WFL.  Has tingling in hands.   RUE Sensation:  (Reports "tingling" in hand) LUE Deficits / Details: Strength and ROM WFL.  Reports tingling in hands   Lower Extremity Assessment: RLE deficits/detail;LLE deficits/detail RLE Deficits / Details: Strength grossly 4-/5.  Decreased sensation in feet. LLE Deficits / Details: Strength grossly 4-/5.  Decreased sensation in feet.     Communication   Communication: No difficulties  Cognition Arousal/Alertness: Awake/alert Behavior During Therapy: WFL for tasks assessed/performed Overall Cognitive Status: Within Functional Limits for tasks assessed                      General Comments      Exercises        Assessment/Plan    PT Assessment Patient needs continued PT services  PT Diagnosis Difficulty walking;Abnormality of gait;Generalized weakness   PT Problem List Decreased strength;Decreased activity tolerance;Decreased balance;Decreased mobility;Decreased coordination;Decreased knowledge of use of DME;Impaired sensation  PT Treatment Interventions DME instruction;Gait training;Functional mobility training;Therapeutic activities;Therapeutic exercise;Balance training;Patient/family education   PT Goals (Current goals can be found in the Care Plan section) Acute Rehab PT Goals Patient Stated Goal: To return to independence PT Goal Formulation: With patient/family Time For Goal Achievement: 04/10/14 Potential to Achieve Goals: Good    Frequency Min 4X/week   Barriers to discharge Decreased caregiver support;Inaccessible home environment Patient lives alone.  Has 11 stairs to enter home.    Co-evaluation               End of Session Equipment Utilized During Treatment: Gait belt Activity Tolerance: Patient limited by fatigue Patient left: in chair;with call bell/phone within reach;with family/visitor present Nurse Communication: Mobility status    Functional  Assessment Tool Used: Clinical judgement Functional Limitation: Mobility: Walking and moving around Mobility: Walking and Moving Around Current Status ([id number]): At least 40 percent but less than 60 percent impaired, limited or restricted Mobility: Walking and Moving Around Goal Status [phone]): At least 1 percent but less than 20 percent impaired, limited or restricted    Time: [phone] PT Time Calculation (min) (ACUTE ONLY): 33 min   Charges:   PT Evaluation $Initial PT Evaluation Tier I: 1 Procedure PT Treatments $Gait Training: 8-22 mins $Therapeutic Activity: 8-22 mins   PT G Codes:   PT G-Codes **NOT FOR INPATIENT CLASS** Functional Assessment Tool Used: Clinical judgement Functional Limitation: Mobility: Walking and moving around Mobility: Walking and Moving Around Current Status ([id number]([id number]): At least 40 percent but less than 60 percent impaired, limited or restricted Mobility: Walking and Moving Around Goal Status [phone](G8979): At least 1 percent but less than 20 percent impaired, limited or restricted    [name], Sherell Christoffel H 03/27/2014, 8:00 PM Durenda HurtSusan H. Renaldo Fiddleravis, PT, [facility] Acute Rehab Services Pager [phone]

## 2014-03-27 NOTE — Progress Notes (Signed)
PROGRESS NOTE  Anna Klein ZOX:[id number] DOB: 1946/01/12 DOA: 03/24/2014 PCP: Amada Jupiter, PA-C  HPI/Recap of past 24 hours: Patient is 69 year old female with past mental history of hypertension and hyperlipidemia who was admitted to the hospitalist service on 1/7 night for several days of abdominal pain. Seen by surgery and felt to have acute cholecystitis, so patient underwent cholecystectomy done 1/8 without complication.  Diet able to be advanced and surgery signed off.   She is however having a number of issues including leg cramping and numbness bilaterally with difficulty walking, dyspnea on exertion and Limited ability to abduct her shoulder. Lab work was noted for hypokalemia.  BNP mildly elevated at 342. Patient started on Lasix with good diuresis.  Today, patient states that her dyspnea is much better. She still having problems with significant numbness in her feet and has difficulty walking. She is also not had a bowel movement for several days.  Assessment/Plan: Active Problems:   Abdominal pain from acute cholecystitis: Status post cholecystectomy resolved and cleared from surgical standpoint.  Constipation: Start on MiraLAX    Nausea andDehydration with hyponatremia: secondary to cholecystitis, resolved. Some of her complaints of thirst now maybe more medication related see below.    Hypokalemia: Likely secondary to IV fluids, normal saline. Have discontinued this. Magnesium level checked found to be normal. Potassium replaced.   Decreased right shoulder range of motion: Unusual. According to patient who is a good historian,was occurring before she was admitted. I'm able to fully rotate her shoulder and suspect that this possibly could be more of a rotator cuff injury. We'll get physical therapy to see and assess. She does not have significant pain    Volume overload/dyspnea on exertion: New problem that developed post surgery. Suspect patient is volume overloaded. No  history of CHF. BNP checked and found to be elevated at 340. Checking echocardiogram good response with Lasix. Patient normally on low dose Lasix at home by by mouth  leg cramping/numbness/weakness/neuropathy: Could be more medication related. Have discontinued all narcotics, muscle relaxers and benzodiazepines that she was getting when necessary here. Could also be hypokalemia. Do not think that this is stroke related given absence of other findings.physical therapy to see. We'll start Neurontin, checking A1c  Code Status: full code  Family Communication: daughter at the bedside  Disposition Plan: Awaiting echocardiogram. Will also need physical therapy evaluation in regards to her numbness   Consultants:  General surgery  Procedures:  Status post lap scopic cholecystectomy done 1/9  2-D echo ordered and pending  Antibiotics:  Cipro and Flagyl 1/7-1/9. Patient on previous by mouth Cipro prior to admission   Objective: BP 138/77 mmHg  Pulse 78  Temp(Src) 99.1 F (37.3 C) (Oral)  Resp 17  Ht  (1.549 m)  Wt 67.8 kg (149 lb 7.6 oz)  BMI 28.26 kg/m2  SpO2 99%  Intake/Output Summary (Last 24 hours) at 03/27/14 0950 Last data filed at 03/27/14 0650  Gross per 24 hour  Intake    520 ml  Output   4475 ml  Net  -3955 ml   Filed Weights   03/25/14 0103 03/26/14 1709 03/27/14 0533  Weight: 66.6 kg (146 lb 13.2 oz) 67 kg (147 lb 11.3 oz) 67.8 kg (149 lb 7.6 oz)    Exam:   General:  Alert and oriented 3, no acute distress  Cardiovascular: regular rate and rhythm, S1-S2  Respiratory: clear to auscultation bilaterally  Abdomen: soft, nontender, nondistended, hypoactive bowel sounds  Musculoskeletal: no clubbing  or cyanosis or edema.   Data Reviewed: Basic Metabolic Panel:  Recent Labs Lab 03/24/14 1914 03/25/14 0120 03/25/14 0545 03/25/14 1013 03/26/14 0459 03/26/14 1320 03/27/14 0525  NA 131* 135  --  134* 134*  --  138  K 3.8 2.7*  --  3.9 3.3*  --   3.3*  CL 96 105  --  103 102  --  102  CO2 20 23  --  24 20  --  28  GLUCOSE 144* 140*  --  151* 116*  --  139*  BUN 17 14  --  12 11  --  12  CREATININE 1.21* 1.17*  --  1.10 1.04  --  1.18*  CALCIUM 9.3 8.6  --  8.6 8.4  --  9.4  MG  --   --  1.8  --   --  1.8  --    Liver Function Tests:  Recent Labs Lab 03/24/14 1914 03/25/14 0545  AST 30 53*  ALT 18 24  ALKPHOS 89 82  BILITOT 2.6* 0.9  PROT 5.9* 5.9*  ALBUMIN 3.6 3.4*    Recent Labs Lab 03/24/14 1914  LIPASE 26   No results for input(s): AMMONIA in the last 168 hours. CBC:  Recent Labs Lab 03/24/14 1914 03/25/14 0120 03/25/14 0545 03/26/14 1159 03/27/14 0525  WBC 9.7 8.2 9.4 9.4 7.7  NEUTROABS 7.6  --  7.4  --   --   HGB 14.5 12.4 13.2 12.3 13.3  HCT 42.0 37.2 39.8 37.9 40.7  MCV 85.4 83.2 84.1 85.4 85.9  PLT 183 187 186 201 228   Cardiac Enzymes:    Recent Labs Lab 03/25/14 0120 03/25/14 1013  TROPONINI 0.04* 0.03   BNP (last 3 results) No results for input(s): PROBNP in the last 8760 hours. CBG: No results for input(s): GLUCAP in the last 168 hours.  Recent Results (from the past 240 hour(s))  Urine culture     Status: None   Collection Time: 03/24/14  7:09 PM  Result Value Ref Range Status   Specimen Description URINE, CLEAN CATCH  Final   Special Requests Normal  Final   Colony Count   Final    3,000 COLONIES/ML Performed at [facility]    Culture   Final    INSIGNIFICANT GROWTH Performed at [facility]    Report Status 03/25/2014 FINAL  Final  Surgical pcr screen     Status: Abnormal   Collection Time: 03/25/14 11:19 AM  Result Value Ref Range Status   MRSA, PCR NEGATIVE NEGATIVE Final   Staphylococcus aureus POSITIVE (A) NEGATIVE Final    Comment:        The Xpert SA Assay (FDA approved for NASAL specimens in patients over 71 years of age), is one component of a comprehensive surveillance program.  Test performance has been validated by [facility] for  patients greater than or equal to 71 year old. It is not intended to diagnose infection nor to guide or monitor treatment.      Studies: No results found.  Scheduled Meds: . atorvastatin  20 mg Oral Daily  . Chlorhexidine Gluconate Cloth  6 each Topical Daily  . docusate sodium  100 mg Oral BID  . enoxaparin (LOVENOX) injection  40 mg Subcutaneous Q24H  . fentaNYL  100 mcg Intravenous Once  . furosemide  40 mg Oral BID  . gabapentin  100 mg Oral BID  . metoprolol  100 mg Oral BID  . mupirocin  ointment  1 application Nasal BID  . polyethylene glycol  17 g Oral Daily  . potassium chloride  40 mEq Oral Daily    Continuous Infusions:    Time spent: 25 minutes  Hollice Espy  [facility] Pager [phone]. If 7PM-7AM, please contact night-coverage at [email or web address], password [facility] 03/27/2014, 9:50 AM  LOS: 3 days

## 2014-03-28 ENCOUNTER — Observation Stay (HOSPITAL_COMMUNITY): Payer: Medicare Other

## 2014-03-28 ENCOUNTER — Encounter (HOSPITAL_COMMUNITY): Payer: Self-pay | Admitting: General Surgery

## 2014-03-28 DIAGNOSIS — I517 Cardiomegaly: Secondary | ICD-10-CM

## 2014-03-28 DIAGNOSIS — I5032 Chronic diastolic (congestive) heart failure: Secondary | ICD-10-CM

## 2014-03-28 DIAGNOSIS — E119 Type 2 diabetes mellitus without complications: Secondary | ICD-10-CM

## 2014-03-28 LAB — BASIC METABOLIC PANEL
ANION GAP: 9 (ref 5–15)
BUN: 20 mg/dL (ref 6–23)
CO2: 26 mmol/L (ref 19–32)
Calcium: 9.6 mg/dL (ref 8.4–10.5)
Chloride: 98 mEq/L (ref 96–112)
Creatinine, Ser: 1.29 mg/dL — ABNORMAL HIGH (ref 0.50–1.10)
GFR, EST AFRICAN AMERICAN: 47 mL/min — AB (ref >90)
GFR, EST NON AFRICAN AMERICAN: 41 mL/min — AB (ref >90)
Glucose, Bld: 153 mg/dL — ABNORMAL HIGH (ref 70–99)
POTASSIUM: 4.6 mmol/L (ref 3.5–5.1)
SODIUM: 133 mmol/L — AB (ref 135–145)

## 2014-03-28 LAB — HEMOGLOBIN A1C
Hgb A1c MFr Bld: 6.4 % — ABNORMAL HIGH (ref <5.7)
Mean Plasma Glucose: 137 mg/dL — ABNORMAL HIGH (ref <117)

## 2014-03-28 MED ORDER — LIVING BETTER WITH HEART FAILURE BOOK
Freq: Once | Status: DC
Start: 2014-03-28 — End: 2014-03-30

## 2014-03-28 MED ORDER — LIVING BETTER WITH HEART FAILURE BOOK: Freq: Once | Status: DC

## 2014-03-28 MED ORDER — ZOLPIDEM TARTRATE 5 MG PO TABS
5.0000 mg | ORAL_TABLET | Freq: Every evening | ORAL | Status: DC | PRN
  Administered 2014-03-28: 5 mg via ORAL
  Filled 2014-03-28: qty 1

## 2014-03-28 MED ORDER — BISACODYL 10 MG RE SUPP
10.0000 mg | Freq: Every day | RECTAL | Status: DC | PRN
  Administered 2014-03-28 – 2014-03-29 (×2): 10 mg via RECTAL
  Filled 2014-03-28 (×2): qty 1

## 2014-03-28 MED ORDER — MORPHINE SULFATE 2 MG/ML IJ SOLN
1.0000 mg | Freq: Once | INTRAMUSCULAR | Status: AC
  Administered 2014-03-28: 1 mg via INTRAVENOUS
  Filled 2014-03-28: qty 1

## 2014-03-28 MED ORDER — MAGIC MOUTHWASH
5.0000 mL | Freq: Four times a day (QID) | ORAL | Status: DC | PRN
  Administered 2014-03-29 (×2): 5 mL via ORAL
  Filled 2014-03-28 (×4): qty 5

## 2014-03-28 MED ORDER — FLEET ENEMA 7-19 GM/118ML RE ENEM
1.0000 | ENEMA | Freq: Once | RECTAL | Status: AC
  Administered 2014-03-29: 1 via RECTAL
  Filled 2014-03-28: qty 1

## 2014-03-28 MED ORDER — ENSURE COMPLETE PO LIQD
237.0000 mL | Freq: Two times a day (BID) | ORAL | Status: DC
  Administered 2014-03-28 – 2014-03-30 (×3): 237 mL via ORAL

## 2014-03-28 NOTE — Progress Notes (Signed)
Pt awakened from sleep, and c/o severe pain in her midback . Pt is asking for strong  pain med. On-call provider Benedetto Coons Callahan, NP notified via text page. At 0222, order received for x1 dose of  1 mg IV morphine. Will cont to monitor.

## 2014-03-28 NOTE — Progress Notes (Signed)
PT Cancellation Note  Patient Details Name: Anna Klein MRN: [id number] DOB: 05/01/1945   Cancelled Treatment:    Reason Eval/Treat Not Completed: Patient at procedure or test/unavailable.  Patient out of room for MRI.  Will return at later time.   [name], [name] 03/28/2014, 6:53 PM Durenda HurtSusan H. Renaldo Fiddleravis, PT, [facility] Acute Rehab Services Pager [phone]

## 2014-03-28 NOTE — Clinical Social Work Psychosocial (Signed)
Clinical Social Work Department BRIEF PSYCHOSOCIAL ASSESSMENT 03/28/2014  Patient:  Anna Klein,Anna Klein     Account Number:  [id number]     Admit date:  03/24/2014  Clinical Social Worker:  Lovey Newcomer  Date/Time:  03/28/2014 09:08 AM  Referred by:  Physician  Date Referred:  03/28/2014 Referred for  SNF Placement   Other Referral:   NA   Interview type:  Patient Other interview type:   Patient and daughter interviewed at bedside to complete assessment.    PSYCHOSOCIAL DATA Living Status:  ALONE Admitted from facility:   Level of care:   Primary support name:  [name] and Melanie Primary support relationship to patient:  CHILD, ADULT Degree of support available:   Support is good.    CURRENT CONCERNS Current Concerns  Post-Acute Placement   Other Concerns:   NA    SOCIAL WORK ASSESSMENT / PLAN CSW met with patient and daughter at bedside to complete assessment. Patient and daughter state that the patient was admitted from home where she has been able to complete her ADLs and live independently. Patient and daughter state that they agree with the recommendation for CIR and would like for patient to be admitted. CSW explained that patient needs a SNF backup plan in the event that CIR is unable to admit patient. CSW explained SNF search/placement process and answered. Patient reports that she has been to [facility] before in the past but would not like to go back to this facility. Patient and daughter both appear calm and engaged in assessment. CSW will followup with available bed offers.   Assessment/plan status:  Psychosocial Support/Ongoing Assessment of Needs Other assessment/ plan:   Complete FL2, fax, PASRR   Information/referral to community resources:   CSW contact information and SNF list given.    PATIENT'S/FAMILY'S RESPONSE TO PLAN OF CARE: Patient and daughter agreeable to SNF placement only as backup to CIR. CSW will assist.       Liz Beach MSW, [place], [place], [phone]

## 2014-03-28 NOTE — Progress Notes (Signed)
UR completed 

## 2014-03-28 NOTE — Clinical Social Work Note (Signed)
CSW provided patient and daughter bed offers. Daughter states, "I am 99% sure that she will not be going to a SNF. If we can get her up with assistance to go to the bathroom, then when will take her home." CSW encouraged patient and daughter to have SNF chosen if patient is going to need it. CSW has updated RNCM about patient[facility] and [facility].   Roddie McBryant Rodolph Hagemann MSW, [place], [place], [phone]

## 2014-03-28 NOTE — Progress Notes (Signed)
  Echocardiogram 2D Echocardiogram has been performed.  Anna Klein, Anna Klein R 03/28/2014, 11:43 AM

## 2014-03-28 NOTE — Clinical Social Work Note (Signed)
Clinical Social Work Department CLINICAL SOCIAL WORK PLACEMENT NOTE 03/28/2014  Patient:  Acklin,Lajean  Account Number:  [id number] Admit date:  03/24/2014  Clinical Social Worker:  Lavell LusterJOSEPH BRYANT Beckham Cap[place]ran, LCSWA  Date/time:  03/28/2014 09:27 AM  Clinical Social Work is seeking post-discharge placement for this patient at the following level of care:   SKILLED NURSING   (*CSW will update this form in Epic as items are completed)   03/28/2014  Patient/family provided with [facility] of Clinical Social Work's list of facilities offering this level of care within the geographic area requested by the patient (or if unable, by the patient's family).  03/28/2014  Patient/family informed of their freedom to choose among providers that offer the needed level of care, that participate in Medicare, Medicaid or managed care program needed by the patient, have an available bed and are willing to accept the patient.  03/28/2014  Patient/family informed of [facility]' ownership interest in [facility], as well as of the fact that they are under no obligation to receive care at this facility.  PASARR submitted to EDS on 03/28/2014 PASARR number received on 03/28/2014  FL2 transmitted to all facilities in geographic area requested by pt/family on  03/28/2014 FL2 transmitted to all facilities within larger geographic area on   Patient informed that his/her managed care company has contracts with or will negotiate with  certain facilities, including the following:     Patient/family informed of bed offers received:  03/28/2014 Patient chooses bed at  Physician recommends and patient chooses bed at    Patient to be transferred to  on   Patient to be transferred to facility by  Patient and family notified of transfer on  Name of family member notified:    The following physician request were entered in Epic:   Additional Comments:    Roddie McBryant Horacio Werth MSW, [place], [place],  [phone]

## 2014-03-28 NOTE — Progress Notes (Addendum)
PROGRESS NOTE  Anna Klein WUJ:[id number] DOB: [birth year] DOA: 03/24/2014 PCP: Amada Jupiter, PA-C  HPI/Recap of past 24 hours: Patient is 69 year old female with past mental history of hypertension and hyperlipidemia who was admitted to the hospitalist service on 1/7 night for several days of abdominal pain. Seen by surgery and felt to have acute cholecystitis, so patient underwent cholecystectomy done 1/8 without complication.  Diet able to be advanced and surgery signed off.   She is however having a number of issues including leg cramping and numbness bilaterally with difficulty walking, dyspnea on exertion and Limited ability to abduct her shoulder. Lab work was noted for hypokalemia.  BNP mildly elevated at 342. Patient started on Lasix with good diuresis which improved her breathing.  Patient's lower extremity numbness has continued to persist affecting her ability to walk. PT recommended inpatient rehabilitation which patient's insurance does not support. Echocardiogram done confirms grade 1 diastolic dysfunction. A1c done at 6.5 noting new diagnosis of diabetes mellitus that is diet controlled.  Patient's had back pain last night and so given numbness issues in her lower extremity is, MRI done which was unrevealing for any kind of radiculopathy. Patient is still still having numbness, but otherwise no complaints.  Assessment/Plan: Active Problems:   Abdominal pain from acute cholecystitis: Status post cholecystectomy resolved and cleared from surgical standpoint.  Constipation: Start on [employer] enema With no results. We'll try Dulcolax suppository    Nausea andDehydration with hyponatremia: secondary to cholecystitis, resolved. Some of her complaints of thirst now maybe more medication related see below.    Hypokalemia: Likely secondary to IV fluids, normal saline. Have discontinued this. Magnesium level checked found to be normal. Potassiucontinuing to be replaced.   Decreased right shoulder range of motion: Unusual. According to patient who is a good historian,was occurring before she was admitted. I'm able to fully rotate her shoulder and suspect that this possibly could be more of a rotator cuff injury. We'll get physical therapy to see and assess. 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 Suspect patient is volume overloaded. No history of CHF. BNP checked and found to be elevated at 340. Checking echocardiogram good response with Lasix. Patient normally on low dose Lasix at home by by mouth. Will add lisinopril   Diabetes mellitus: A1c back at 6.5 so she technically does have diabetes. Diet controlled. At this point would not add medications, but patient will need outpatient follow-up with her PCP to follow standards of care such as annual ophthalmology exam.  leg cramping/numbness/weakness/neuropathy: Could be more medication related. Have discontinued all narcotics, muscle relaxers and benzodiazepines that she was getting when necessary here. Could also be hypokalemia. Do not think that this is stroke related given absence of other finding, will try Neurontin. We'll ask neurology to see Code Status: full code  Family Communication: daughter at the bedside  Disposition Plan: Awaiting echocardiogram. Will also need physical therapy evaluation in regards to her numbness   Consultants:  General surgery  Neurology   Procedures:  Status post lap scopic cholecystectomy done 1/9  2-D echdone 1/11: Grade 1 diastolic dysfunction   Antibiotics:  Cipro and Flagyl 1/7-1/9. Patient on previous by mouth Cipro prior to admission   Objective: BP 136/70 mmHg  Pulse 95  Temp(Src) 98.2 F (36.8 C) (Oral)  Resp 18  Ht  (1.549 m)  Wt 64.774 kg (142 lb 12.8 oz)  BMI 27.00 kg/m2  SpO2 100%  Intake/Output Summary (Last 24 hours) at 03/28/14 1933 Last data filed at 03/28/14  1427  Gross per 24 hour  Intake    450 ml  Output    970 ml  Net   -520 ml   Filed Weights   03/26/14 1709 03/27/14 0533 03/28/14 0550  Weight: 67 kg (147 lb 11.3 oz) 67.8 kg (149 lb 7.6 oz) 64.774 kg (142 lb 12.8 oz)    Exam:   General:  Alert and oriented 3, no acute distress  Cardiovascular: regular rate and rhythm, S1-S2  Respiratory: clear to auscultation bilaterally  Abdomen: soft, nontender, nondistended, hypoactive bowel sounds  Musculoskeletal: no clubbing or cyanosis or edema.   Data Reviewed: Basic Metabolic Panel:  Recent Labs Lab 03/25/14 0120 03/25/14 0545 03/25/14 1013 03/26/14 0459 03/26/14 1320 03/27/14 0525 03/28/14 0443  NA 135  --  134* 134*  --  138 133*  K 2.7*  --  3.9 3.3*  --  3.3* 4.6  CL 105  --  103 102  --  102 98  CO2 23  --  24 20  --  28 26  GLUCOSE 140*  --  151* 116*  --  139* 153*  BUN 14  --  12 11  --  12 20  CREATININE 1.17*  --  1.10 1.04  --  1.18* 1.29*  CALCIUM 8.6  --  8.6 8.4  --  9.4 9.6  MG  --  1.8  --   --  1.8  --   --    Liver Function Tests:  Recent Labs Lab 03/24/14 1914 03/25/14 0545  AST 30 53*  ALT 18 24  ALKPHOS 89 82  BILITOT 2.6* 0.9  PROT 5.9* 5.9*  ALBUMIN 3.6 3.4*    Recent Labs Lab 03/24/14 1914  LIPASE 26   No results for input(s): AMMONIA in the last 168 hours. CBC:  Recent Labs Lab 03/24/14 1914 03/25/14 0120 03/25/14 0545 03/26/14 1159 03/27/14 0525  WBC 9.7 8.2 9.4 9.4 7.7  NEUTROABS 7.6  --  7.4  --   --   HGB 14.5 12.4 13.2 12.3 13.3  HCT 42.0 37.2 39.8 37.9 40.7  MCV 85.4 83.2 84.1 85.4 85.9  PLT 183 187 186 201 228   Cardiac Enzymes:    Recent Labs Lab 03/25/14 0120 03/25/14 1013  TROPONINI 0.04* 0.03   BNP (last 3 results) No results for input(s): PROBNP in the last 8760 hours. CBG: No results for input(s): GLUCAP in the last 168 hours.  Recent Results (from the past 240 hour(s))  Urine culture     Status: None   Collection Time: 03/24/14  7:09 PM    Result Value Ref Range Status   Specimen Description URINE, CLEAN CATCH  Final   Special Requests Normal  Final   Colony Count   Final    3,000 COLONIES/ML Performed at [facility]    Culture   Final    INSIGNIFICANT GROWTH Performed at [facility]    Report Status 03/25/2014 FINAL  Final  Surgical pcr screen     Status: Abnormal   Collection Time: 03/25/14 11:19 AM  Result Value Ref Range Status   MRSA, PCR NEGATIVE NEGATIVE Final   Staphylococcus aureus POSITIVE (A) NEGATIVE Final    Comment:        The Xpert SA Assay (FDA approved for NASAL specimens in patients over 321 years of age), is one component of a comprehensive surveillance program.  Test performance  has been validated by [employer] for patients greater than or equal to 71 year old. It is not intended to diagnose infection nor to guide or monitor treatment.      Studies: No results found.  Scheduled Meds: . atorvastatin  20 mg Oral Daily  . Chlorhexidine Gluconate Cloth  6 each Topical Daily  . docusate sodium  100 mg Oral BID  . enoxaparin (LOVENOX) injection  40 mg Subcutaneous Q24H  . feeding supplement (ENSURE COMPLETE)  237 mL Oral BID BM  . fentaNYL  100 mcg Intravenous Once  . furosemide  40 mg Oral BID  . gabapentin  100 mg Oral BID  . Living Better with Heart Failure Book   Does not apply Once  . Living Better with Heart Failure Book   Does not apply Once  . metoprolol  100 mg Oral BID  . mupirocin ointment  1 application Nasal BID  . polyethylene glycol  17 g Oral Daily  . potassium chloride  40 mEq Oral Daily    Continuous Infusions:    Time spent: 25 minutes  Hollice Espy  [facility] Pager [phone]. If 7PM-7AM, please contact night-coverage at [email or web address], password [facility] 03/28/2014, 7:33 PM  LOS: 4 days

## 2014-03-28 NOTE — Progress Notes (Signed)
Rehab Admissions Coordinator Note:  Patient was screened by Clois DupesBoyette, Mariabelen Pressly Godwin for appropriateness for an Inpatient Acute Rehab Consult per PT recommendation.  At this time, we are recommending Skilled Nursing Facility.AARP medicare will not approve admission for pt's diagnosis.  Clois DupesBoyette, Barnes Florek Godwin 03/28/2014, 8:33 AM  I can be reached at [phone].

## 2014-03-29 ENCOUNTER — Encounter (HOSPITAL_COMMUNITY): Payer: Self-pay | Admitting: Neurology

## 2014-03-29 DIAGNOSIS — R509 Fever, unspecified: Secondary | ICD-10-CM

## 2014-03-29 DIAGNOSIS — M6281 Muscle weakness (generalized): Secondary | ICD-10-CM

## 2014-03-29 DIAGNOSIS — E1140 Type 2 diabetes mellitus with diabetic neuropathy, unspecified: Secondary | ICD-10-CM

## 2014-03-29 LAB — BASIC METABOLIC PANEL
Anion gap: 19 — ABNORMAL HIGH (ref 5–15)
BUN: 28 mg/dL — ABNORMAL HIGH (ref 6–23)
CO2: 22 mmol/L (ref 19–32)
Calcium: 9.9 mg/dL (ref 8.4–10.5)
Chloride: 94 mEq/L — ABNORMAL LOW (ref 96–112)
Creatinine, Ser: 1.31 mg/dL — ABNORMAL HIGH (ref 0.50–1.10)
GFR calc Af Amer: 47 mL/min — ABNORMAL LOW (ref >90)
GFR calc non Af Amer: 40 mL/min — ABNORMAL LOW (ref >90)
Glucose, Bld: 142 mg/dL — ABNORMAL HIGH (ref 70–99)
Potassium: 3.1 mmol/L — ABNORMAL LOW (ref 3.5–5.1)
Sodium: 135 mmol/L (ref 135–145)

## 2014-03-29 LAB — FOLATE: Folate: 13.7 ng/mL

## 2014-03-29 LAB — CK TOTAL AND CKMB (NOT AT ARMC)
CK, MB: 3.1 ng/mL (ref 0.3–4.0)
Relative Index: INVALID (ref 0.0–2.5)
Total CK: 36 U/L (ref 7–177)

## 2014-03-29 LAB — VITAMIN B12: Vitamin B-12: 298 pg/mL (ref 211–911)

## 2014-03-29 LAB — SEDIMENTATION RATE: SED RATE: 17 mm/h (ref 0–22)

## 2014-03-29 MED ORDER — BISACODYL 10 MG RE SUPP
10.0000 mg | Freq: Once | RECTAL | Status: AC
  Administered 2014-03-29: 10 mg via RECTAL
  Filled 2014-03-29: qty 1

## 2014-03-29 MED ORDER — HYDRALAZINE HCL 10 MG PO TABS
10.0000 mg | ORAL_TABLET | Freq: Three times a day (TID) | ORAL | Status: DC
  Administered 2014-03-29 – 2014-03-30 (×5): 10 mg via ORAL
  Filled 2014-03-29 (×7): qty 1

## 2014-03-29 MED ORDER — HYDRALAZINE HCL 20 MG/ML IJ SOLN: 5.0000 mg | Freq: Four times a day (QID) | INTRAMUSCULAR | Status: DC | PRN

## 2014-03-29 MED ORDER — MAGNESIUM CITRATE PO SOLN
1.0000 | Freq: Once | ORAL | Status: AC
  Administered 2014-03-29: 1 via ORAL
  Filled 2014-03-29: qty 296

## 2014-03-29 NOTE — Progress Notes (Signed)
Physical Therapy Treatment Patient Details Name: Anna Klein MRN: [id number] DOB: 1945/05/28 Today's Date: 03/29/2014    History of Present Illness Patient is a 69 yo female admitted 03/24/14 with abdominal pain, now s/p cholecystectomy.  Patient had an assisted fall at home pta.  Initially had decreased ROM Rt shoulder (resolved).  Patient now with new LE weakness and decreased sensation bil feet.  PMH:  HTN, HLD    PT Comments    Emphasis on transfer safety and gait stability with the RW.  4 trials of gait to improve tolerance and safety.   Follow Up Recommendations  CIR;Supervision/Assistance - 24 hour     Equipment Recommendations  Rolling walker with 5" wheels;3in1 (PT);Wheelchair (measurements PT);Wheelchair cushion (measurements PT)    Recommendations for Other Services Rehab consult     Precautions / Restrictions      Mobility  Bed Mobility Overal bed mobility: Needs Assistance Bed Mobility: Supine to Sit;Sit to Supine     Supine to sit: Min assist Sit to supine: Supervision   General bed mobility comments: difficulty getting up due to back pain and incoordination  Transfers Overall transfer level: Needs assistance Equipment used: Rolling walker (2 wheeled) Transfers: Sit to/from Stand Sit to Stand: Min assist         General transfer comment: Verbal cues for hand placement.  Assist to rise to standing and for balance.  Requires support from BUE's to maintain balance.  Mod assist to pivot to [facility].  Requires assist to control descent onto [facility] and into chair.  Ambulation/Gait Ambulation/Gait assistance: Mod assist Ambulation Distance (Feet): 20 Feet (35 feet, 30 feet and another 25 feet with rest in b/w each) Assistive device: Rolling walker (2 wheeled) Gait Pattern/deviations: Step-through pattern;Scissoring;Ataxic (variable width) Gait velocity: Decreased   General Gait Details: consistent Klein/tcues.  Clearly decrease proprioception with ataxia and decrease  control at ankles and knees.   Stairs            Wheelchair Mobility    Modified Rankin (Stroke Patients Only)       Balance Overall balance assessment: Needs assistance Sitting-balance support: No upper extremity supported Sitting balance-Leahy Scale: Fair     Standing balance support: Bilateral upper extremity supported Standing balance-Leahy Scale: Poor Standing balance comment: needs RW/                    Cognition Arousal/Alertness: Awake/alert Behavior During Therapy: WFL for tasks assessed/performed Overall Cognitive Status: Within Functional Limits for tasks assessed                      Exercises      General Comments        Pertinent Vitals/Pain Pain Assessment: Faces Faces Pain Scale: Hurts even more Pain Location: back Pain Descriptors / Indicators: Aching Pain Intervention(s): Limited activity within patient's tolerance;Monitored during session    Home Living                      Prior Function            PT Goals (current goals can now be found in the care plan section) Acute Rehab PT Goals Patient Stated Goal: To return to independence PT Goal Formulation: With patient/family Time For Goal Achievement: 04/10/14 Potential to Achieve Goals: Good Progress towards PT goals: Progressing toward goals    Frequency  Min 4X/week    PT Plan Current plan remains appropriate    Co-evaluation  End of Session   Activity Tolerance: Patient limited by fatigue;Patient tolerated treatment well Patient left: in bed;with call bell/phone within reach;with family/visitor present (sitting EOB)     Time: [id number] PT Time Calculation (min) (ACUTE ONLY): 52 min  Charges:  $Gait Training: 8-22 mins $Therapeutic Activity: 23-37 mins                    G Codes:      Anna Klein, Eliseo GumKenneth Klein 03/29/2014, 1:06 PM 03/29/2014  [place] BingKen Maggi Klein, PT [phone]  (pager)

## 2014-03-29 NOTE — Progress Notes (Addendum)
PROGRESS NOTE  Anna Klein ZOX:[id number] DOB: [birth year] DOA: 03/24/2014 PCP: Amada Jupiter, PA-C  HPI/Recap of past 24 hours: Patient is 69 year old female with past mental history of hypertension and hyperlipidemia who was admitted to the hospitalist service on 1/7 night for several days of abdominal pain. Seen by surgery and felt to have acute cholecystitis, so patient underwent cholecystectomy done 1/8 without complication.  Diet able to be advanced and surgery signed off.   She is however having a number of issues including leg cramping and numbness bilaterally with difficulty walking, dyspnea on exertion and Limited ability to abduct her shoulder. Lab work was noted for hypokalemia.  BNP mildly elevated at 342. Patient started on Lasix with good diuresis which improved her breathing.  Patient's lower extremity numbness has continued to persist affecting her ability to walk. PT recommended inpatient rehabilitation which patient's insurance does not support. Echocardiogram done confirms grade 1 diastolic dysfunction. A1c done at 6.5 noting new diagnosis of diabetes mellitus that is diet controlled.  Patient's had back pain last night and so given numbness issues in her lower extremity is, MRI done which was unrevealing for any kind of radiculopathy. Patient is still still having numbness, but otherwise no complaints. Seen by neurology with workup started, likely may be more long-term and continue his outpatient. In the last 24-48 hours, patient has persistently elevated blood pressures of unclear etiology. She is fully diuresed. Now noticing low-grade temperatures.  Patient is self doing okay. Still lots of numbness and difficulty walking. No pain. Breathing comfortably.  Assessment/Plan: Active Problems:   Abdominal pain from acute cholecystitis: Status post cholecystectomy resolved and cleared from surgical standpoint.    Fever: Check chest x-ray and urinalysis. If negative, will check  CT scan of abdomen given recent surgery.  Constipation: Patient had a very difficult time moving her bowels despite MiraLAX, Colace, Fleet's enema, Dulcolax. We'll try GoLYTELY prep along with manual disimpaction. If this does not work, would even perhaps consider imaging.  ? Persistent constipation as cause for fever from bacterial translocation of the gut     Nausea andDehydration with hyponatremia: secondary to cholecystitis, resolved. Some of her complaints of thirst now maybe more medication related see below.    Hypokalemia: Likely secondary to IV fluids, normal saline. Have discontinued this. Magnesium level checked found to be normal. Potassiucontinuing to be replaced.    Decreased right shoulder range of motion: Unusual. According to patient who is a good historian,was occurring before she was admitted. I'm able to fully rotate her shoulder and suspect that this possibly could be more of a rotator cuff injury. We'll get physical therapy to see and assess. 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 Suspect patient is volume overloaded. No history of CHF. BNP checked and found to be elevated at 340. Checking echocardiogram good response with Lasix. Patient normally on low dose Lasix at home by by mouth. Will add lisinopril   Constipation:  Diabetes mellitus: A1c back at 6.5 so she technically does have diabetes. Diet controlled. At this point would not add medications, but patient will need outpatient follow-up with her PCP to follow standards of care such as annual ophthalmology exam.  leg cramping/numbness/weakness/neuropathy: Could be more medication related. Have discontinued all narcotics, muscle relaxers and benzodiazepines that she was getting when necessary here. Trial of Neurontin. Seen by neurology and stroke workup initiated. Concerned that Lipitor could be causing myalgias so that is discontinued. CPK,  B-12,  folate and sedimentation rate all normal. Heavy metal screen ordered and is pending. Patient has such a difficult time ambulating, there are concerns for her safety to be at home by herself. Not inpatient rehabilitation candidate. Family absolute does not want skilled nursing. When she does go home, home with home health Code Status: full code  Family Communication: daughter at the bedside  Disposition Plan: Once patient's blood pressures improved and infection ruled out   Consultants:  General surgery  Neurology   Procedures:  Status post lap scopic cholecystectomy done 1/9  2-D echdone 1/11: Grade 1 diastolic dysfunction   Antibiotics:  Cipro and Flagyl 1/7-1/9. Patient on previous by mouth Cipro prior to admission   Objective: BP 156/77 mmHg  Pulse 88  Temp(Src) 98.3 F (36.8 C) (Oral)  Resp 18  Ht  (1.549 m)  Wt 63.866 kg (140 lb 12.8 oz)  BMI 26.62 kg/m2  SpO2 100%  Intake/Output Summary (Last 24 hours) at 03/29/14 1940 Last data filed at 03/29/14 1032  Gross per 24 hour  Intake    120 ml  Output    550 ml  Net   -430 ml   Filed Weights   03/27/14 0533 03/28/14 0550 03/29/14 0531  Weight: 67.8 kg (149 lb 7.6 oz) 64.774 kg (142 lb 12.8 oz) 63.866 kg (140 lb 12.8 oz)    Exam:   General:  Alert and oriented 3, no acute distress  Cardiovascular: regular rate and rhythm, S1-S2  Respiratory: clear to auscultation bilaterally  Abdomen: soft, nontender, nondistended, hypoactive bowel sounds  Musculoskeletal: no clubbing or cyanosis or edema.   Data Reviewed: Basic Metabolic Panel:  Recent Labs Lab 03/25/14 0545 03/25/14 1013 03/26/14 0459 03/26/14 1320 03/27/14 0525 03/28/14 0443 03/29/14 1140  NA  --  134* 134*  --  138 133* 135  K  --  3.9 3.3*  --  3.3* 4.6 3.1*  CL  --  103 102  --  102 98 94*  CO2  --  24 20  --  GLUCOSE  --  151* 116*  --  139* 153* 142*  BUN  --  12 11  --  12 20 28*  CREATININE  --  1.10 1.04  --   1.18* 1.29* 1.31*  CALCIUM  --  8.6 8.4  --  9.4 9.6 9.9  MG 1.8  --   --  1.8  --   --   --    Liver Function Tests:  Recent Labs Lab 03/24/14 1914 03/25/14 0545  AST 30 53*  ALT 18 24  ALKPHOS 89 82  BILITOT 2.6* 0.9  PROT 5.9* 5.9*  ALBUMIN 3.6 3.4*    Recent Labs Lab 03/24/14 1914  LIPASE 26   No results for input(s): AMMONIA in the last 168 hours. CBC:  Recent Labs Lab 03/24/14 1914 03/25/14 0120 03/25/14 0545 03/26/14 1159 03/27/14 0525  WBC 9.7 8.2 9.4 9.4 7.7  NEUTROABS 7.6  --  7.4  --   --   HGB 14.5 12.4 13.2 12.3 13.3  HCT 42.0 37.2 39.8 37.9 40.7  MCV 85.4 83.2 84.1 85.4 85.9  PLT 183 187 186 201 228   Cardiac Enzymes:    Recent Labs Lab 03/25/14 0120 03/25/14 1013 03/29/14 1140  CKTOTAL  --   --  36  CKMB  --   --  3.1  TROPONINI 0.04* 0.03  --    BNP (last 3 results) No results for input(s):  PROBNP in the last 8760 hours. CBG: No results for input(s): GLUCAP in the last 168 hours.  Recent Results (from the past 240 hour(s))  Urine culture     Status: None   Collection Time: 03/24/14  7:09 PM  Result Value Ref Range Status   Specimen Description URINE, CLEAN CATCH  Final   Special Requests Normal  Final   Colony Count   Final    3,000 COLONIES/ML Performed at [facility]    Culture   Final    INSIGNIFICANT GROWTH Performed at [facility]    Report Status 03/25/2014 FINAL  Final  Surgical pcr screen     Status: Abnormal   Collection Time: 03/25/14 11:19 AM  Result Value Ref Range Status   MRSA, PCR NEGATIVE NEGATIVE Final   Staphylococcus aureus POSITIVE (A) NEGATIVE Final    Comment:        The Xpert SA Assay (FDA approved for NASAL specimens in patients over 71 years of age), is one component of a comprehensive surveillance program.  Test performance has been validated by [facility] for patients greater than or equal to 71 year old. It is not intended to diagnose infection nor to guide or monitor  treatment.      Studies: Mr Thoracic Spine Wo Contrast  03/28/2014   CLINICAL DATA:  Neuropathy. Fall. New lower extremity weakness and decreased sensation in the feet bilaterally.  EXAM: MRI THORACIC SPINE WITHOUT CONTRAST  TECHNIQUE: Multiplanar, multisequence MR imaging of the thoracic spine was performed. No intravenous contrast was administered.  COMPARISON:  Two-view chest x-ray 03/24/2014.  FINDINGS: A remote anterior fracture is present at L1. Schmorl's nodes are present at T12-L1 and L1-2. There is focal kyphosis at the at L1 level. Normal signal is present throughout the thoracic spinal cord. As rightward curvature of thoracic spine centered at T6.  No significant focal central canal stenosis is present. Small disc protrusions are present.  Mild right foraminal narrowing is present at T10-11 and T11-12 due to facet spurring at these levels. Mild left foraminal narrowing is present at T6-7. There is foraminal narrowing bilaterally at C7-T1 and T1-2, worse on the right.  Atherosclerotic changes are noted in the aorta without aneurysm. A large hiatal hernia is present.  IMPRESSION: 1. Exaggerated kyphosis centered at L1 were a remote fracture is evident. 2. Rightward curvature of the thoracic spine is centered at T6. 3. No significant central canal stenosis. 4. Mild foraminal disease as described.   Electronically Signed   By: Gennette Pachris  Mattern M.D.   On: 03/28/2014 15:56    Scheduled Meds: . Chlorhexidine Gluconate Cloth  6 each Topical Daily  . docusate sodium  100 mg Oral BID  . enoxaparin (LOVENOX) injection  40 mg Subcutaneous Q24H  . feeding supplement (ENSURE COMPLETE)  237 mL Oral BID BM  . fentaNYL  100 mcg Intravenous Once  . gabapentin  100 mg Oral BID  . hydrALAZINE  10 mg Oral 3 times per day  . Living Better with Heart Failure Book   Does not apply Once  . Living Better with Heart Failure Book   Does not apply Once  . metoprolol  100 mg Oral BID  . mupirocin ointment  1  application Nasal BID  . polyethylene glycol  17 g Oral Daily  . potassium chloride  40 mEq Oral Daily    Continuous Infusions:    Time spent: 35 minutes  Hollice EspyKRISHNAN,Janesha Brissette K  [facility] Pager [phone]. If 7PM-7AM,  please contact night-coverage at [email or web address], password [facility] 03/29/2014, 7:40 PM  LOS: 5 days

## 2014-03-29 NOTE — Consult Note (Signed)
NEURO HOSPITALIST CONSULT NOTE    Reason for Consult: LE decreased sensation.   HPI:                                                                                                                                          Anna Klein is an 69 y.o. female with past mental history of hypertension and hyperlipidemia who was admitted to the hospitalist service on 1/7 night for several days of abdominal pain. Seen by surgery and felt to have acute cholecystitis, so patient underwent cholecystectomy done 1/8 without complication.while in hospital she has noted multiple problems including bilateral leg cramping and decreased sensation in bilateral feet and ankles along with in bilateral hands.  She states this all occurred SUDDENLY one week ago.  She woke up and could not feel her feet and could not tell if she had her shoes on.  Due to the lack of sensation she was unable to walk well. She also feels she has had a generalized weakness of both LE and UE.  There is mention of  limited ability to use her right shoulder but she states this is not new and has been present for some time.    Patient recently has a A1c of 6.5 and newly diagnosed DM. patient is on Lipitor and has been on this for the past 6 weeks. She denies any muscle pains but is unsure if the weakness has slowly progressed. She admits to some back pain but this has been on going for many years.   While in hospital she has been slightly hypokalemic 3.3 which is now corrected.  B12, Folate, Thiamine Pending  Past Medical History  Diagnosis Date  . Hypertension     Past Surgical History  Procedure Laterality Date  . Cholecystectomy N/A 03/25/2014    Procedure: LAPAROSCOPIC CHOLECYSTECTOMY WITH INTRAOPERATIVE CHOLANGIOGRAM;  Surgeon: Ralene Ok, MD;  Location: [facility];  Service: General;  Laterality: N/A;    Family History  Problem Relation Age of Onset  . Hypertension Mother   . Hypertension Father      Social  History:  reports that she has never smoked. She does not have any smokeless tobacco history on file. She reports that she does not drink alcohol or use illicit drugs.  No Known Allergies  MEDICATIONS:  Prior to Admission:  Prescriptions prior to admission  Medication Sig Dispense Refill Last Dose  . acetaminophen (TYLENOL) 500 MG tablet Take 500 mg by mouth every 6 (six) hours as needed for mild pain.   03/24/2014 at Unknown time  . ALPRAZolam (XANAX) 0.25 MG tablet Take 0.25 mg by mouth 3 (three) times daily as needed for anxiety.   03/23/2014 at Unknown time  . atorvastatin (LIPITOR) 20 MG tablet Take 20 mg by mouth daily.   Past Week at Unknown time  . ciprofloxacin (CIPRO) 250 MG tablet Take 250 mg by mouth 2 (two) times daily. Take for 7 days started medication on 03-23-14   03/24/2014 at Unknown time  . cyclobenzaprine (FLEXERIL) 10 MG tablet Take 10 mg by mouth at bedtime as needed for muscle spasms.   Past Week at Unknown time  . furosemide (LASIX) 20 MG tablet Take 20 mg by mouth daily.   Past Week at Unknown time  . metoprolol (LOPRESSOR) 100 MG tablet Take 100 mg by mouth 2 (two) times daily.   03/24/2014 at Unknown time  . ondansetron (ZOFRAN) 4 MG tablet Take 4 mg by mouth every 8 (eight) hours as needed for nausea or vomiting.   03/23/2014 at Unknown time  . traMADol (ULTRAM) 50 MG tablet Take 50 mg by mouth 3 (three) times daily as needed for moderate pain.   03/24/2014 at Unknown time   Scheduled: . atorvastatin  20 mg Oral Daily  . Chlorhexidine Gluconate Cloth  6 each Topical Daily  . docusate sodium  100 mg Oral BID  . enoxaparin (LOVENOX) injection  40 mg Subcutaneous Q24H  . feeding supplement (ENSURE COMPLETE)  237 mL Oral BID BM  . fentaNYL  100 mcg Intravenous Once  . furosemide  40 mg Oral BID  . gabapentin  100 mg Oral BID  . hydrALAZINE  10 mg Oral 3 times per  day  . Living Better with Heart Failure Book   Does not apply Once  . Living Better with Heart Failure Book   Does not apply Once  . metoprolol  100 mg Oral BID  . mupirocin ointment  1 application Nasal BID  . polyethylene glycol  17 g Oral Daily  . potassium chloride  40 mEq Oral Daily     ROS:                                                                                                                                       History obtained from the patient  General ROS: negative for - chills, fatigue, fever, night sweats, weight gain or weight loss Psychological ROS: negative for - behavioral disorder, hallucinations, memory difficulties, mood swings or suicidal ideation Ophthalmic ROS: negative for - blurry vision, double vision, eye pain or loss of vision ENT ROS: negative for - epistaxis, nasal discharge, oral lesions, sore throat, tinnitus or vertigo Allergy and Immunology ROS:  negative for - hives or itchy/watery eyes Hematological and Lymphatic ROS: negative for - bleeding problems, bruising or swollen lymph nodes Endocrine ROS: negative for - galactorrhea, hair pattern changes, polydipsia/polyuria or temperature intolerance Respiratory ROS: negative for - cough, hemoptysis, shortness of breath or wheezing Cardiovascular ROS: negative for - chest pain, dyspnea on exertion, edema or irregular heartbeat Gastrointestinal ROS: negative for - abdominal pain, diarrhea, hematemesis, nausea/vomiting or stool incontinence Genito-Urinary ROS: negative for - dysuria, hematuria, incontinence or urinary frequency/urgency Musculoskeletal ROS: negative for - joint swelling or muscular weakness Neurological ROS: as noted in HPI Dermatological ROS: negative for rash and skin lesion changes   Blood pressure 185/82, pulse 89, temperature 98.5 F (36.9 C), temperature source Oral, resp. rate 12, height _0  (1.549 m), weight 63.866 kg (140 lb 12.8 oz), SpO2 100 %.   Neurologic Examination:                                                                                                       HEENT-  Normocephalic, no lesions, without obvious abnormality.  Normal external eye and conjunctiva.  Normal TM's bilaterally.  Normal auditory canals and external ears. Normal external nose, mucus membranes and septum.  Normal pharynx. Cardiovascular- S1, S2 normal, pulses palpable throughout   Lungs- no tachypnea, retractions or cyanosis Abdomen- normal findings: bowel sounds normal and no bruits heard Extremities- no edema Lymph-no adenopathy palpable Musculoskeletal-no joint tenderness, deformity or swelling Skin-warm and dry, no hyperpigmentation, vitiligo, or suspicious lesions  Neurological Examination Mental Status: Alert, oriented, thought content appropriate.  Speech fluent without evidence of aphasia.  Able to follow 3 step commands without difficulty. Cranial Nerves: II: Discs flat bilaterally; Visual fields grossly normal, pupils equal, round, reactive to light and accommodation III,IV, VI: ptosis not present, extra-ocular motions intact bilaterally V,VII: smile symmetric, facial light touch sensation normal bilaterally VIII: hearing normal bilaterally IX,X: gag reflex present XI: bilateral shoulder shrug XII: midline tongue extension Motor: Right : Upper extremity   4+/5    Left:     Upper extremity   4+/5  Lower extremity   4/5     Lower extremity   4/5 Tone and bulk:normal tone throughout; no atrophy noted Sensory: Pinprick and light touch intact throughout, bilaterally but has stocking distribution decreased vibration, cold temperature.  Proprioception intact.  Deep Tendon Reflexes: 2+ and symmetric throughout UE NO KJ or AJ Plantars: Mute bilaterally Cerebellar: normal finger-to-nose, normal heel-to-shin test Gait: unstable and stating she feels heavy in the legs.       Lab Results: Basic Metabolic Panel:  Recent Labs Lab 03/25/14 0120 03/25/14 0545  03/25/14 1013 03/26/14 0459 03/26/14 1320 03/27/14 0525 03/28/14 0443  NA 135  --  134* 134*  --  138 133*  K 2.7*  --  3.9 3.3*  --  3.3* 4.6  CL 105  --  103 102  --  102 98  CO2 23  --  24 20  --  28 26  GLUCOSE 140*  --  151* 116*  --  139* 153*  BUN 14  --  12 11  --  12 20  CREATININE 1.17*  --  1.10 1.04  --  1.18* 1.29*  CALCIUM 8.6  --  8.6 8.4  --  9.4 9.6  MG  --  1.8  --   --  1.8  --   --     Liver Function Tests:  Recent Labs Lab 03/24/14 1914 03/25/14 0545  AST 30 53*  ALT 18 24  ALKPHOS 89 82  BILITOT 2.6* 0.9  PROT 5.9* 5.9*  ALBUMIN 3.6 3.4*    Recent Labs Lab 03/24/14 1914  LIPASE 26   No results for input(s): AMMONIA in the last 168 hours.  CBC:  Recent Labs Lab 03/24/14 1914 03/25/14 0120 03/25/14 0545 03/26/14 1159 03/27/14 0525  WBC 9.7 8.2 9.4 9.4 7.7  NEUTROABS 7.6  --  7.4  --   --   HGB 14.5 12.4 13.2 12.3 13.3  HCT 42.0 37.2 39.8 37.9 40.7  MCV 85.4 83.2 84.1 85.4 85.9  PLT 183 187 186 201 228    Cardiac Enzymes:  Recent Labs Lab 03/25/14 0120 03/25/14 1013  TROPONINI 0.04* 0.03    Lipid Panel: No results for input(s): CHOL, TRIG, HDL, CHOLHDL, VLDL, LDLCALC in the last 168 hours.  CBG: No results for input(s): GLUCAP in the last 168 hours.  Microbiology: Results for orders placed or performed during the hospital encounter of 03/24/14  Urine culture     Status: None   Collection Time: 03/24/14  7:09 PM  Result Value Ref Range Status   Specimen Description URINE, CLEAN CATCH  Final   Special Requests Normal  Final   Colony Count   Final    3,000 COLONIES/ML Performed at [facility]    Culture   Final    INSIGNIFICANT GROWTH Performed at [facility]    Report Status 03/25/2014 FINAL  Final  Surgical pcr screen     Status: Abnormal   Collection Time: 03/25/14 11:19 AM  Result Value Ref Range Status   MRSA, PCR NEGATIVE NEGATIVE Final   Staphylococcus aureus POSITIVE (A) NEGATIVE Final     Comment:        The Xpert SA Assay (FDA approved for NASAL specimens in patients over 59 years of age), is one component of a comprehensive surveillance program.  Test performance has been validated by [employer] for patients greater than or equal to 89 year old. It is not intended to diagnose infection nor to guide or monitor treatment.     Coagulation Studies: No results for input(s): LABPROT, INR in the last 72 hours.  Imaging: Mr Thoracic Spine Wo Contrast  03/28/2014   CLINICAL DATA:  Neuropathy. Fall. New lower extremity weakness and decreased sensation in the feet bilaterally.  EXAM: MRI THORACIC SPINE WITHOUT CONTRAST  TECHNIQUE: Multiplanar, multisequence MR imaging of the thoracic spine was performed. No intravenous contrast was administered.  COMPARISON:  Two-view chest x-ray 03/24/2014.  FINDINGS: A remote anterior fracture is present at L1. Schmorl's nodes are present at T12-L1 and L1-2. There is focal kyphosis at the at L1 level. Normal signal is present throughout the thoracic spinal cord. As rightward curvature of thoracic spine centered at T6.  No significant focal central canal stenosis is present. Small disc protrusions are present.  Mild right foraminal narrowing is present at T10-11 and T11-12 due to facet spurring at these levels. Mild left foraminal narrowing is present at T6-7. There is foraminal narrowing bilaterally at C7-T1 and T1-2, worse on the  right.  Atherosclerotic changes are noted in the aorta without aneurysm. A large hiatal hernia is present.  IMPRESSION: 1. Exaggerated kyphosis centered at L1 were a remote fracture is evident. 2. Rightward curvature of the thoracic spine is centered at T6. 3. No significant central canal stenosis. 4. Mild foraminal disease as described.   Electronically Signed   By: Lawrence Santiago M.D.   On: 03/28/2014 15:56       Assessment and plan per attending neurologist  Etta Quill PA-C [facility] [phone]  03/29/2014, 10:16 AM   Assessment/Plan: 69 YO female with one+ week of bilateral LE > UE weakness and decreased sensation in bilateral feet and ankles. Etiology is unclear at this time.  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   Recommend: 1) Labs: Heavy metal screen, Thiamine, Folate, B12, CK, ESR 2) Hold Lipitor for now 3) continue physical therapy intervention 4) Will need out patient NCV as out patient.   We will continue to follow this patient with you.  I personally participated in this patient's evaluation and management, including formulating the above clinical impression and management recommendations.  Rush Farmer M.D. [facility] [phone]

## 2014-03-29 NOTE — Progress Notes (Signed)
UR completed 

## 2014-03-30 ENCOUNTER — Encounter (HOSPITAL_COMMUNITY): Payer: Self-pay | Admitting: Radiology

## 2014-03-30 ENCOUNTER — Emergency Department (HOSPITAL_COMMUNITY): Payer: Medicare Other

## 2014-03-30 ENCOUNTER — Inpatient Hospital Stay (HOSPITAL_COMMUNITY)
Admission: EM | Admit: 2014-03-30 | Discharge: 2014-04-05 | DRG: 092 | Disposition: A | Discharge status: Skilled Nursing Facility | Payer: Medicare Other | Attending: Internal Medicine | Admitting: Internal Medicine

## 2014-03-30 DIAGNOSIS — A499 Bacterial infection, unspecified: Secondary | ICD-10-CM | POA: Insufficient documentation

## 2014-03-30 DIAGNOSIS — M549 Dorsalgia, unspecified: Secondary | ICD-10-CM | POA: Diagnosis present

## 2014-03-30 DIAGNOSIS — N390 Urinary tract infection, site not specified: Secondary | ICD-10-CM | POA: Diagnosis present

## 2014-03-30 DIAGNOSIS — Z8249 Family history of ischemic heart disease and other diseases of the circulatory system: Secondary | ICD-10-CM

## 2014-03-30 DIAGNOSIS — I10 Essential (primary) hypertension: Secondary | ICD-10-CM | POA: Diagnosis present

## 2014-03-30 DIAGNOSIS — G610 Guillain-Barre syndrome: Secondary | ICD-10-CM | POA: Insufficient documentation

## 2014-03-30 DIAGNOSIS — E1165 Type 2 diabetes mellitus with hyperglycemia: Secondary | ICD-10-CM | POA: Diagnosis present

## 2014-03-30 DIAGNOSIS — Z79899 Other long term (current) drug therapy: Secondary | ICD-10-CM | POA: Diagnosis not present

## 2014-03-30 DIAGNOSIS — I5032 Chronic diastolic (congestive) heart failure: Secondary | ICD-10-CM | POA: Diagnosis present

## 2014-03-30 DIAGNOSIS — R29810 Facial weakness: Secondary | ICD-10-CM | POA: Diagnosis present

## 2014-03-30 DIAGNOSIS — N179 Acute kidney failure, unspecified: Secondary | ICD-10-CM | POA: Diagnosis present

## 2014-03-30 DIAGNOSIS — E538 Deficiency of other specified B group vitamins: Secondary | ICD-10-CM | POA: Diagnosis present

## 2014-03-30 DIAGNOSIS — E119 Type 2 diabetes mellitus without complications: Secondary | ICD-10-CM

## 2014-03-30 DIAGNOSIS — R531 Weakness: Secondary | ICD-10-CM | POA: Diagnosis present

## 2014-03-30 DIAGNOSIS — R262 Difficulty in walking, not elsewhere classified: Secondary | ICD-10-CM | POA: Diagnosis present

## 2014-03-30 DIAGNOSIS — Z1623 Resistance to quinolones and fluoroquinolones: Secondary | ICD-10-CM | POA: Diagnosis present

## 2014-03-30 DIAGNOSIS — B9689 Other specified bacterial agents as the cause of diseases classified elsewhere: Secondary | ICD-10-CM | POA: Diagnosis present

## 2014-03-30 DIAGNOSIS — G629 Polyneuropathy, unspecified: Secondary | ICD-10-CM

## 2014-03-30 DIAGNOSIS — Z1612 Extended spectrum beta lactamase (ESBL) resistance: Secondary | ICD-10-CM

## 2014-03-30 DIAGNOSIS — R109 Unspecified abdominal pain: Secondary | ICD-10-CM

## 2014-03-30 DIAGNOSIS — B9620 Unspecified Escherichia coli [E. coli] as the cause of diseases classified elsewhere: Secondary | ICD-10-CM | POA: Diagnosis present

## 2014-03-30 DIAGNOSIS — R1084 Generalized abdominal pain: Secondary | ICD-10-CM

## 2014-03-30 DIAGNOSIS — I639 Cerebral infarction, unspecified: Secondary | ICD-10-CM

## 2014-03-30 LAB — URINALYSIS, ROUTINE W REFLEX MICROSCOPIC
BILIRUBIN URINE: NEGATIVE
BILIRUBIN URINE: NEGATIVE
GLUCOSE, UA: NEGATIVE mg/dL
GLUCOSE, UA: NEGATIVE mg/dL
HGB URINE DIPSTICK: NEGATIVE
KETONES UR: NEGATIVE mg/dL
KETONES UR: NEGATIVE mg/dL
Nitrite: NEGATIVE
Nitrite: NEGATIVE
PH: 6 (ref 5.0–8.0)
PH: 6 (ref 5.0–8.0)
Protein, ur: NEGATIVE mg/dL
Protein, ur: NEGATIVE mg/dL
SPECIFIC GRAVITY, URINE: 1.021 (ref 1.005–1.030)
Specific Gravity, Urine: 1.02 (ref 1.005–1.030)
Urobilinogen, UA: 0.2 mg/dL (ref 0.0–1.0)
Urobilinogen, UA: 0.2 mg/dL (ref 0.0–1.0)

## 2014-03-30 LAB — RAPID URINE DRUG SCREEN, HOSP PERFORMED
AMPHETAMINES: NOT DETECTED
BARBITURATES: POSITIVE — AB
BENZODIAZEPINES: NOT DETECTED
Cocaine: NOT DETECTED
Opiates: NOT DETECTED
Tetrahydrocannabinol: NOT DETECTED

## 2014-03-30 LAB — COMPREHENSIVE METABOLIC PANEL
ALBUMIN: 4.1 g/dL (ref 3.5–5.2)
ALK PHOS: 111 U/L (ref 39–117)
ALT: 46 U/L — AB (ref 0–35)
AST: 34 U/L (ref 0–37)
Anion gap: 15 (ref 5–15)
BUN: 49 mg/dL — ABNORMAL HIGH (ref 6–23)
CO2: 24 mmol/L (ref 19–32)
Calcium: 10 mg/dL (ref 8.4–10.5)
Chloride: 94 mEq/L — ABNORMAL LOW (ref 96–112)
Creatinine, Ser: 1.71 mg/dL — ABNORMAL HIGH (ref 0.50–1.10)
GFR calc Af Amer: 34 mL/min — ABNORMAL LOW (ref >90)
GFR calc non Af Amer: 29 mL/min — ABNORMAL LOW (ref >90)
Glucose, Bld: 154 mg/dL — ABNORMAL HIGH (ref 70–99)
POTASSIUM: 3.9 mmol/L (ref 3.5–5.1)
SODIUM: 133 mmol/L — AB (ref 135–145)
TOTAL PROTEIN: 7.3 g/dL (ref 6.0–8.3)
Total Bilirubin: 0.6 mg/dL (ref 0.3–1.2)

## 2014-03-30 LAB — BASIC METABOLIC PANEL
ANION GAP: 14 (ref 5–15)
BUN: 40 mg/dL — ABNORMAL HIGH (ref 6–23)
CALCIUM: 9.3 mg/dL (ref 8.4–10.5)
CO2: 25 mmol/L (ref 19–32)
CREATININE: 1.45 mg/dL — AB (ref 0.50–1.10)
Chloride: 91 mEq/L — ABNORMAL LOW (ref 96–112)
GFR calc non Af Amer: 36 mL/min — ABNORMAL LOW (ref >90)
GFR, EST AFRICAN AMERICAN: 41 mL/min — AB (ref >90)
Glucose, Bld: 127 mg/dL — ABNORMAL HIGH (ref 70–99)
Potassium: 3.6 mmol/L (ref 3.5–5.1)
Sodium: 130 mmol/L — ABNORMAL LOW (ref 135–145)

## 2014-03-30 LAB — DIFFERENTIAL
Basophils Absolute: 0.1 10*3/uL (ref 0.0–0.1)
Basophils Relative: 0 % (ref 0–1)
EOS ABS: 0.2 10*3/uL (ref 0.0–0.7)
Eosinophils Relative: 1 % (ref 0–5)
LYMPHS PCT: 20 % (ref 12–46)
Lymphs Abs: 2.8 10*3/uL (ref 0.7–4.0)
MONOS PCT: 8 % (ref 3–12)
Monocytes Absolute: 1.1 10*3/uL — ABNORMAL HIGH (ref 0.1–1.0)
Neutro Abs: 10.2 10*3/uL — ABNORMAL HIGH (ref 1.7–7.7)
Neutrophils Relative %: 71 % (ref 43–77)

## 2014-03-30 LAB — CBC
HCT: 43 % (ref 36.0–46.0)
Hemoglobin: 14.7 g/dL (ref 12.0–15.0)
MCH: 28.5 pg (ref 26.0–34.0)
MCHC: 34.2 g/dL (ref 30.0–36.0)
MCV: 83.5 fL (ref 78.0–100.0)
Platelets: 294 10*3/uL (ref 150–400)
RBC: 5.15 MIL/uL — AB (ref 3.87–5.11)
RDW: 14.7 % (ref 11.5–15.5)
WBC: 14.4 10*3/uL — ABNORMAL HIGH (ref 4.0–10.5)

## 2014-03-30 LAB — URINE MICROSCOPIC-ADD ON

## 2014-03-30 LAB — PROTIME-INR
INR: 0.99 (ref 0.00–1.49)
PROTHROMBIN TIME: 13.2 s (ref 11.6–15.2)

## 2014-03-30 LAB — I-STAT TROPONIN, ED: Troponin i, poc: 0.01 ng/mL (ref 0.00–0.08)

## 2014-03-30 LAB — ETHANOL

## 2014-03-30 LAB — APTT: aPTT: 27 seconds (ref 24–37)

## 2014-03-30 MED ORDER — SODIUM CHLORIDE 0.9 % IV BOLUS (SEPSIS): 500.0000 mL | Freq: Once | INTRAVENOUS | Status: DC

## 2014-03-30 MED ORDER — POLYETHYLENE GLYCOL 3350 17 G PO PACK: 17.0000 g | PACK | Freq: Every day | ORAL | Status: DC

## 2014-03-30 MED ORDER — ALPRAZOLAM 0.25 MG PO TABS: 0.2500 mg | ORAL_TABLET | Freq: Three times a day (TID) | ORAL | Status: DC | PRN

## 2014-03-30 MED ORDER — CIPROFLOXACIN HCL 500 MG PO TABS: 500.0000 mg | ORAL_TABLET | Freq: Two times a day (BID) | ORAL | Status: DC

## 2014-03-30 MED ORDER — HYDRALAZINE HCL 10 MG PO TABS: 10.0000 mg | ORAL_TABLET | Freq: Three times a day (TID) | ORAL | Status: DC

## 2014-03-30 MED ORDER — GABAPENTIN 100 MG PO CAPS: 100.0000 mg | ORAL_CAPSULE | Freq: Two times a day (BID) | ORAL | Status: DC

## 2014-03-30 MED ORDER — ALPRAZOLAM 0.5 MG PO TABS: 0.5000 mg | ORAL_TABLET | Freq: Three times a day (TID) | ORAL | Status: DC | PRN

## 2014-03-30 MED ORDER — TRAMADOL HCL 50 MG PO TABS: 50.0000 mg | ORAL_TABLET | Freq: Three times a day (TID) | ORAL | Status: DC | PRN

## 2014-03-30 MED ORDER — HYDRALAZINE HCL 25 MG PO TABS: 25.0000 mg | ORAL_TABLET | Freq: Three times a day (TID) | ORAL | Status: DC

## 2014-03-30 MED ORDER — DSS 100 MG PO CAPS: 100.0000 mg | ORAL_CAPSULE | Freq: Two times a day (BID) | ORAL | Status: DC

## 2014-03-30 MED ORDER — LORAZEPAM 2 MG/ML IJ SOLN
0.5000 mg | Freq: Once | INTRAMUSCULAR | Status: AC
  Administered 2014-03-30: 0.5 mg via INTRAVENOUS
  Filled 2014-03-30: qty 1

## 2014-03-30 MED ORDER — ENSURE COMPLETE PO LIQD: 237.0000 mL | Freq: Two times a day (BID) | ORAL | Status: DC

## 2014-03-30 NOTE — Clinical Social Work Placement (Signed)
Clinical Social Work Department CLINICAL SOCIAL WORK PLACEMENT NOTE 03/30/2014  Patient:  Anna Klein,Anna Klein  Account Number:  [id number] Admit date:  03/24/2014  Clinical Social Worker:  Lavell LusterJOSEPH BRYANT Nazier Neyhart, LCSWA  Date/time:  03/28/2014 09:27 AM  Clinical Social Work is seeking post-discharge placement for this patient at the following level of care:   SKILLED NURSING   (*CSW will update this form in Epic as items are completed)   03/28/2014  Patient/family provided with [facility] Department of Clinical Social Work's list of facilities offering this level of care within the geographic area requested by the patient (or if unable, by the patient's family).  03/28/2014  Patient/family informed of their freedom to choose among providers that offer the needed level of care, that participate in Medicare, Medicaid or managed care program needed by the patient, have an available bed and are willing to accept the patient.  03/28/2014  Patient/family informed of [facility]' ownership interest in [facility], as well as of the fact that they are under no obligation to receive care at this facility.  PASARR submitted to EDS on 03/28/2014 PASARR number received on 03/28/2014  FL2 transmitted to all facilities in geographic area requested by pt/family on  03/28/2014 FL2 transmitted to all facilities within larger geographic area on   Patient informed that his/her managed care company has contracts with or will negotiate with  certain facilities, including the following:     Patient/family informed of bed offers received:  03/28/2014 [facility] Physician recommends and patient chooses bed at    [facility] on  03/30/2014 Patient to be transferred to facility by Personal vehicle Patient and family notified of transfer on 03/30/2014 Name of family member notified:  Shawna OrleansMelanie and Selena BattenKim  The following physician request were  entered in Epic:   Additional Comments:    Per MD [facility] RN, patient, patient's family, and facility notified of DC. RN given number for report. DC packet on chart. Patient's daughters are transporting. Family instructed to give DC packet to facility. CSW signing off.    Roddie McBryant Ilah Boule MSW, [place], [place], [phone]

## 2014-03-30 NOTE — Consult Note (Signed)
Neurology Consultation Reason for Consult: Concern for Stroke Referring Physician: Jeanell Sparrow, D  CC: Concern for stroke  History is obtained from: Patient, family  HPI: Anna Klein is a 69 y.o. female with a history of recent gall bladder removal who was recently hospitalized with GB removal on the 8th. She was worked up for a relatively rapid development of neuropathy with thiamine, folate, ESR sent which were normal. She has a B1 as well as heavy metal screen which are pending. She also has a B12 level which was borderline at 298.  She had noticed this prior to surgery, but following surgery she noticed it to be much worse. Of note, she did receive nitrous oxide during surgery.  She is back in the hospital today because of a left facial droop. Her family noticed even possibly this morning that she was not blinking normally. Then over the course of the day became progressively worse and the family noticed at 5 PM that it seemed very noticeable and therefore she was transported to the emergency room.  She is currently in a skilled nursing facility for rehabilitation. She has an appointment coming up with Dr. Jannifer Franklin for an EMG.  Also of note, she was recently diagnosed with gout in April, she has had multiple flares always involving the joint of the great toe on either the left or right foot. Her rheumatologist has tried to tap it, but has not successfully demonstrated crystals in the joint. I'm not sure if she had elevated urate during these flares.  She also has had difficulty abductor in her right shoulder, and describes it is painful with a dull ache whenever she tries to move it. She has no difficulty with other movements of that shoulder. The pain is improving, but she continues to have weakness. She has no numbness of the shoulder.   LKW: Unclear possibly last night tpa given?: no, outside of window   ROS: A 14 point ROS was performed and is negative except as noted in the HPI.   Past  Medical History  Diagnosis Date  . Hypertension   . DM (diabetes mellitus)     Family History: No history of autoimmune disease  Social History: Tob: Denies  Exam: Current vital signs: BP 172/70 mmHg  Pulse 94  Temp(Src) 98.4 F (36.9 C) (Oral)  Resp 16  SpO2 99% Vital signs in last 24 hours: Temp:  [97.9 F (36.6 C)-98.4 F (36.9 C)] 98.4 F (36.9 C) (01/13 2129) Pulse Rate:  [76-100] 94 (01/13 2129) Resp:  [16-24] 16 (01/13 2129) BP: (132-172)/(67-99) 172/70 mmHg (01/13 2129) SpO2:  [99 %-100 %] 99 % (01/13 2129) Weight:  [62.914 kg (138 lb 11.2 oz)] 62.914 kg (138 lb 11.2 oz) (01/13 0530)   Physical Exam  Constitutional: Appears well-developed and well-nourished.  Psych: Affect appropriate to situation Eyes: No scleral injection HENT: No OP obstrucion Head: Normocephalic.  Cardiovascular: Normal rate and regular rhythm.  Respiratory: Effort normal and breath sounds normal to anterior ascultation GI: Soft.  No distension. There is no tenderness.  Skin: WDI  Neuro: Mental Status: Patient is awake, alert, oriented to person, place, month, year, and situation. Patient is able to give a clear and coherent history. No signs of aphasia or neglect Cranial Nerves: II: Visual Fields are full. Pupils are equal, round, and reactive to light.   III,IV, VI: EOMI without ptosis or diploplia.  V: Facial sensation is symmetric to temperature VII: Facial movement is notable for prominent weakness of her left lower  face. There is some asymmetry with blinking but for head wrinkling appears symmetric. VIII: hearing is intact to voice X: Uvula elevates symmetrically XI: Shoulder shrug is symmetric. XII: tongue is midline without atrophy or fasciculations.  Motor: Tone is normal. Bulk is normal. She has full strength in the left arm. She has 2/5 weakness of shoulder abduction. In the legs she has symmetric strength with 4/5 knee flexion, 4+/5 knee extension 4/5 ankle  dorsiflexion 5/5 plantar flexion. 4+/5 hip flexion bilaterally. Sensory: Sensation is decreased in a symmetric fashion below the mid calf to pinprick as well as light touch. She has intact sensation throughout her right shoulder. Deep Tendon Reflexes: Absent at the ankles and knees, 2+ at the biceps bilaterally Plantars: Toes are downgoing bilaterally.  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 The presence of pain as well as lack of sensory involvement make me suspect that the right shoulder weakness is musculoskeletal as opposed to a C5 radiculopathy. The involvement of her lower face only makes me think that  her facial weakness is likely upper motor neuron as opposed to a peripheral seventh nerve palsy.  One possible etiology of her distal symmetric polyneuropathy would be B12 deficiency, though this is typically a much slower progressing condition. Her symptoms did seem to worsen with surgery and she did receive nitrous oxide during surgery which can cause a more rapid progression of B12 associated neuropathy. Even with N2O, however, It is still usually more subacute. If B12 deficiency is  may see dorsal column involvement on MRI.    I would start workup with an MRI of her brain and C-spine. If there is no explanation found on this, then the involvement of the facial nerve would raise my suspicion for not autoimmune process such as AIDP. In truth, this can be a polyradiculoneuropathy as opposed to just polyneuropathy. Also of concern would be other autoimmune causes of neuropathy.  Recommendations: 1) MRI brain and C-spine 2) if no explanation of her symptoms is found on this, then I would consider LP to assess for findings consistent with GBS.  3)  will add MMA, CRP, ANA 4) Also, if no findings on MRI would consider further vasculitic workup as rarely vasculitic neuropathy can mimic distal symmetric polyneuropathy.   Roland Rack, MD [facility] [phone]  If 7pm- 7am, please page neurology on call as listed in [place].

## 2014-03-30 NOTE — Progress Notes (Signed)
Subjective: States she has no change in sensation of her feet but does feel stronger in bilateral legs.  Walked with PT with aid of walker.  Plan is to go to SNF.   Objective: Current vital signs: BP 132/76 mmHg  Pulse 76  Temp(Src) 98.3 F (36.8 C) (Oral)  Resp 21  Ht  (1.549 m)  Wt 62.914 kg (138 lb 11.2 oz)  BMI 26.22 kg/m2  SpO2 100% Vital signs in last 24 hours: Temp:  [97.9 F (36.6 C)-100.2 F (37.9 C)] 98.3 F (36.8 C) (01/13 0530) Pulse Rate:  [76-100] 76 (01/13 0530) Resp:  [18-24] 21 (01/13 0530) BP: (132-156)/(76-93) 132/76 mmHg (01/13 0530) SpO2:  [99 %-100 %] 100 % (01/13 0530) Weight:  [62.914 kg (138 lb 11.2 oz)] 62.914 kg (138 lb 11.2 oz) (01/13 0530)  Intake/Output from previous day: 01/12 0701 - 01/13 0700 In: 120 [P.O.:120] Out: 550 [Urine:250; Stool:300] Intake/Output this shift:   Nutritional status: DIET SOFT  Neurologic Exam: Mental Status: Alert, oriented, thought content appropriate. Speech fluent without evidence of aphasia. Able to follow 3 step commands without difficulty. Cranial Nerves: II: Discs flat bilaterally; Visual fields grossly normal, pupils equal, round, reactive to light and accommodation III,IV, VI: ptosis not present, extra-ocular motions intact bilaterally V,VII: smile symmetric, facial light touch sensation normal bilaterally VIII: hearing normal bilaterally IX,X: gag reflex present XI: bilateral shoulder shrug XII: midline tongue extension Motor: Right :Upper extremity 4+/5Left: Upper extremity 4+/5 Lower extremity 4/5Lower extremity 4/5 Tone and bulk:normal tone throughout; no atrophy noted Sensory: Pinprick and light touch intact throughout, bilaterally but has stocking distribution decreased vibration, cold temperature. Proprioception intact.  Deep Tendon Reflexes: 2+ and symmetric  throughout UE NO KJ or AJ Plantars: Mute bilaterally Cerebellar: normal finger-to-nose, normal heel-to-shin test Gait: unstable and stating she feels heavy in the legs.   Lab Results: Basic Metabolic Panel:  Recent Labs Lab 03/25/14 0545  03/26/14 0459 03/26/14 1320 03/27/14 0525 03/28/14 0443 03/29/14 1140 03/30/14 0845  NA  --   < > 134*  --  138 133* 135 130*  K  --   < > 3.3*  --  3.3* 4.6 3.1* 3.6  CL  --   < > 102  --  102 98 94* 91*  CO2  --   < > 20  --  GLUCOSE  --   < > 116*  --  139* 153* 142* 127*  BUN  --   < > 11  --  12 20 28* 40*  CREATININE  --   < > 1.04  --  1.18* 1.29* 1.31* 1.45*  CALCIUM  --   < > 8.4  --  9.4 9.6 9.9 9.3  MG 1.8  --   --  1.8  --   --   --   --   < > = values in this interval not displayed.  Liver Function Tests:  Recent Labs Lab 03/24/14 1914 03/25/14 0545  AST 30 53*  ALT 18 24  ALKPHOS 89 82  BILITOT 2.6* 0.9  PROT 5.9* 5.9*  ALBUMIN 3.6 3.4*    Recent Labs Lab 03/24/14 1914  LIPASE 26   No results for input(s): AMMONIA in the last 168 hours.  CBC:  Recent Labs Lab 03/24/14 1914 03/25/14 0120 03/25/14 0545 03/26/14 1159 03/27/14 0525  WBC 9.7 8.2 9.4 9.4 7.7  NEUTROABS 7.6  --  7.4  --   --   HGB 14.5 12.4  13.2 12.3 13.3  HCT 42.0 37.2 39.8 37.9 40.7  MCV 85.4 83.2 84.1 85.4 85.9  PLT 183 187 186 201 228    Cardiac Enzymes:  Recent Labs Lab 03/25/14 0120 03/25/14 1013 03/29/14 1140  CKTOTAL  --   --  36  CKMB  --   --  3.1  TROPONINI 0.04* 0.03  --     Lipid Panel: No results for input(s): CHOL, TRIG, HDL, CHOLHDL, VLDL, LDLCALC in the last 168 hours.  CBG: No results for input(s): GLUCAP in the last 168 hours.  Microbiology: Results for orders placed or performed during the hospital encounter of 03/24/14  Urine culture     Status: None   Collection Time: 03/24/14  7:09 PM  Result Value Ref Range Status   Specimen Description URINE, CLEAN CATCH  Final   Special Requests  Normal  Final   Colony Count   Final    3,000 COLONIES/ML Performed at [facility]    Culture   Final    INSIGNIFICANT GROWTH Performed at [facility]    Report Status 03/25/2014 FINAL  Final  Surgical pcr screen     Status: Abnormal   Collection Time: 03/25/14 11:19 AM  Result Value Ref Range Status   MRSA, PCR NEGATIVE NEGATIVE Final   Staphylococcus aureus POSITIVE (A) NEGATIVE Final    Comment:        The Xpert SA Assay (FDA approved for NASAL specimens in patients over 71 years of age), is one component of a comprehensive surveillance program.  Test performance has been validated by [facility] for patients greater than or equal to 688 year old. It is not intended to diagnose infection nor to guide or monitor treatment.     Coagulation Studies: No results for input(s): LABPROT, INR in the last 72 hours.  Imaging: Mr Thoracic Spine Wo Contrast  03/28/2014   CLINICAL DATA:  Neuropathy. Fall. New lower extremity weakness and decreased sensation in the feet bilaterally.  EXAM: MRI THORACIC SPINE WITHOUT CONTRAST  TECHNIQUE: Multiplanar, multisequence MR imaging of the thoracic spine was performed. No intravenous contrast was administered.  COMPARISON:  Two-view chest x-ray 03/24/2014.  FINDINGS: A remote anterior fracture is present at L1. Schmorl's nodes are present at T12-L1 and L1-2. There is focal kyphosis at the at L1 level. Normal signal is present throughout the thoracic spinal cord. As rightward curvature of thoracic spine centered at T6.  No significant focal central canal stenosis is present. Small disc protrusions are present.  Mild right foraminal narrowing is present at T10-11 and T11-12 due to facet spurring at these levels. Mild left foraminal narrowing is present at T6-7. There is foraminal narrowing bilaterally at C7-T1 and T1-2, worse on the right.  Atherosclerotic changes are noted in the aorta without aneurysm. A large hiatal hernia is  present.  IMPRESSION: 1. Exaggerated kyphosis centered at L1 were a remote fracture is evident. 2. Rightward curvature of the thoracic spine is centered at T6. 3. No significant central canal stenosis. 4. Mild foraminal disease as described.   Electronically Signed   By: Gennette Pachris  Mattern M.D.   On: 03/28/2014 15:56    Medications:  Scheduled: . docusate sodium  100 mg Oral BID  . enoxaparin (LOVENOX) injection  40 mg Subcutaneous Q24H  . feeding supplement (ENSURE COMPLETE)  237 mL Oral BID BM  . fentaNYL  100 mcg Intravenous Once  . gabapentin  100 mg Oral BID  . hydrALAZINE  10 mg Oral  3 times per day  . Living Better with Heart Failure Book   Does not apply Once  . Living Better with Heart Failure Book   Does not apply Once  . metoprolol  100 mg Oral BID  . polyethylene glycol  17 g Oral Daily  . potassium chloride  40 mEq Oral Daily    Assessment/Plan: 70 YO female with one+ week of bilateral LE > UE weakness and decreased sensation in bilateral feet and ankles. Etiology is unclear at this time. Considerations include peripheral neuropathy likely related, at least in part, to diabetes mellitus. CK, Sed rate, folate, B12 have all resulted in normal levels. Lipitor currently being held. HEavy etal screen and Thiamine pending.   Recommend: 1) PT 2) Will need out patient neurology follow up for NCV. If patient goes home today she may have results followed as a out patient.    Felicie Morn PA-C [facility] [phone]  03/30/2014, 10:14 AM

## 2014-03-30 NOTE — ED Notes (Signed)
[facility] and daughter noticed droop to left side when smiling and she came back to ER at 1800.  Pt states she has no feeling in her feet times 1 week.

## 2014-03-30 NOTE — Progress Notes (Signed)
CARE MANAGEMENT NOTE 03/30/2014  Patient:  Anna Klein,Anna Klein   Account Number:  [id number]  Date Initiated:  03/30/2014  Documentation initiated by:  [facility]Nilson Tabora  Subjective/Objective Assessment:   Dehydration with hyponatremia     Action/Plan:   Anticipated DC Date:  03/30/2014   Anticipated DC Plan:  SKILLED NURSING FACILITY  In-house referral  Clinical Social Worker      DC Planning Services  CM consult      Choice offered to / List presented to:             Status of service:  Completed, signed off Medicare Important Message given?  YES (If response is "NO", the following Medicare IM given date fields will be blank) Date Medicare IM given:  03/30/2014 Medicare IM given by:  [facility]Oshen Wlodarczyk Date Additional Medicare IM given:   Additional Medicare IM given by:    Discharge Disposition:  SKILLED NURSING FACILITY  Per UR Regulation:    If discussed at Long Length of Stay Meetings, dates discussed:    Comments:  03/30/2014 1200 NCM spoke to pt and plan dc to SNF. Chart reviewed. CSW following for SNF placement. Isidoro DonningAlesia Maraki Macquarrie RN CCM Case Mgmt phone [phone]

## 2014-03-30 NOTE — ED Notes (Signed)
Patient transported to MRI 

## 2014-03-30 NOTE — Progress Notes (Signed)
[facility]ley Bentson discharged Skilled nursing facility per MD order.  Report called to receiving [name], [facility] Reviewed D/C instructions with pt. & daughter.  Also gave "Living well with Heart Failure" booklet and review with pt. Pt. Was able to do teach back with the zones.    Medication List    STOP taking these medications        atorvastatin 20 MG tablet  Commonly known as:  LIPITOR     cyclobenzaprine 10 MG tablet  Commonly known as:  FLEXERIL     furosemide 20 MG tablet  Commonly known as:  LASIX      TAKE these medications        acetaminophen 500 MG tablet  Commonly known as:  TYLENOL  Take 500 mg by mouth every 6 (six) hours as needed for mild pain.     ALPRAZolam 0.5 MG tablet  Commonly known as:  XANAX  Take 1 tablet (0.5 mg total) by mouth 3 (three) times daily as needed for anxiety.     ciprofloxacin 500 MG tablet  Commonly known as:  CIPRO  Take 1 tablet (500 mg total) by mouth 2 (two) times daily.     DSS 100 MG Caps  Take 100 mg by mouth 2 (two) times daily.     feeding supplement (ENSURE COMPLETE) Liqd  Take 237 mLs by mouth 2 (two) times daily between meals.     gabapentin 100 MG capsule  Commonly known as:  NEURONTIN  Take 1 capsule (100 mg total) by mouth 2 (two) times daily.     hydrALAZINE 25 MG tablet  Commonly known as:  APRESOLINE  Take 1 tablet (25 mg total) by mouth every 8 (eight) hours.     metoprolol 100 MG tablet  Commonly known as:  LOPRESSOR  Take 100 mg by mouth 2 (two) times daily.     ondansetron 4 MG tablet  Commonly known as:  ZOFRAN  Take 4 mg by mouth every 8 (eight) hours as needed for nausea or vomiting.     polyethylene glycol packet  Commonly known as:  MIRALAX / GLYCOLAX  Take 17 g by mouth daily.     traMADol 50 MG tablet  Commonly known as:  ULTRAM  Take 1 tablet (50 mg total) by mouth 3 (three) times daily as needed for moderate pain.        Patients skin is clean, dry and intact, no evidence of  skin break down. IV site discontinued and catheter remains intact. Site without signs and symptoms of complications. Dressing and pressure applied.  Patient transported on a stretcher by non emergent EMS,  no distress noted upon discharge.  [name], Drucella Karbowski C 03/30/2014 4:39 PM

## 2014-03-30 NOTE — ED Notes (Signed)
Off unit with xray. 

## 2014-03-30 NOTE — ED Notes (Signed)
Code Stroke activated @ 2127

## 2014-03-30 NOTE — Discharge Summary (Addendum)
Physician Discharge Summary  Anna Klein GLO:[id number] DOB: April 27, 1945 DOA: 03/24/2014  PCP: Maia Plan, PA-C  Admit date: 03/24/2014 Discharge date: 03/30/2014  Time spent: 35 minutes  Recommendations for Outpatient Follow-up:  1. Discharged to skilled nursing facility for rehabilitation 2. Patient needs to follow-up with [facility] neurology in 2 weeks to have outpatient nerve conduction study. 3. Follow-up results for thiamine level and heavy metal screening as outpatient. 4. Follow-up with surgery Dr. Rosendo Gros in 2 weeks  Discharge Diagnoses:  Principal Problem:   Cholecystitis, acute  Active Problems:   Abdominal pain   Dehydration with hyponatremia   Nausea   Hypokalemia   Decreased right shoulder range of motion   Neuropathy   Constipation   DM type 2 (diabetes mellitus, type 2)   Chronic diastolic heart failure   Muscle weakness   Fever   Acute kidney injury   Essential hypertension, benign   UTI   Discharge Condition: Fair  Diet recommendation: Low-sodium/diabetic  Filed Weights   03/28/14 0550 03/29/14 0531 03/30/14 0530  Weight: 64.774 kg (142 lb 12.8 oz) 63.866 kg (140 lb 12.8 oz) 62.914 kg (138 lb 11.2 oz)    History of present illness:  69 year old female with history of hypertension and hyperlipidemia who was admitted to hospitalist service on 1/7 for several days of abdominal pain. Ultrasound abdomen showing cholelithiasis. Patient was seen by general surgery and underwent laparoscopic cholecystectomy on 1/8. Diet advanced and surgery signed off with recommendations for outpatient follow-up. Still course however prolonged due to multiple issues including a cramping, numbness over bilateral lower leg and difficulty walking. Patient also complained of dyspnea on exertion and limited shoulder abduction. Blood work was noted for hypokalemia. BNP was mildly elevated at 342. A 2-D echo done showed grade 1 diastolic dysfunction. Patient was started on Lasix with  good diuresis and improvement in her symptoms. Patient's lower extremity numbness and some weakness persisted affecting her ambulation. Physical therapy evaluated the patient and recommended inpatient rehabilitation however her insurance did not support this. An MRI of the thoracic spine was done negative complains of low back pain which was negative for any compression or radiculopathy. Neurology was consulted. Blood work sent for CK, ESR, folate, B12 were all negative. TSH was normal. Her Lipitor and Flexeril were held. Blood was sent for thiamine and heavy metal screening and is pending.  Patient has been able to ablate better today and will be discharged to skilled nursing facility.  Hospital Course:  Acute cholecystitis Status postcholecystectomy. Tolerating advanced diet. Patient should follow-up with Dr. Etheleen Nicks is in 2 weeks.  Left cramping/numbness/weakness/peripheral neuropathy No clear etiology. Could be related to medications including narcotics/muscle relaxants/ benzodiazepines. Patient started on Neurontin. Seen by neurology. Lipitor and Flexeril have been discontinued. CPK, B12, folate and ESR have all been normal. Heavy metal screening and thiamine levels are pending and should be followed up as outpatient. Patient has been able to ambulate with help of a walker. He now agrees to go to skilled nursing facility for ongoing rehabilitation. -Patient's daughter reports that patient has been very stressed out since her husband's date about 2 and half months back and seems to be very anxious. This could all be contributing to her underlying symptoms. Patient is on low-dose Xanax at home which have increased to 0.5 mg 3 times a day as needed anxiety symptoms. -As per neurology she would need outpatient nerve conduction study and should follow-up with [facility] neurology in 2 weeks.  Hypokalemia Replenish  Acute kidney injury Mild.  Likely related to Lasix that she received. Lasix now  discontinued.  Severe constipation She was given aggressive bowel regimen including MiraLAX, Colace, fleets enema and Dulcolax. She was given GoLYTELY prep along with manual disimpaction after which it helped with her symptoms. We'll discharge her on bowel regimen.  Nausea and dehydration with hyponatremia  Symptoms improve at present. Has mild hyponatremia. Encourage by mouth intake.  Elevated blood pressure Resumed home dose metoprolol. Increased home dose of hydralazine to 25 mg 3 times a day. Monitor as outpatient.  Decreased right shoulder range of motion. Thought to have some degree of rotator cuff injury. Has good range of motion and pain on my evaluation today.  Acute on chronic diastolic heart failure Likely following IV fluids post surgery. Echo shows grade 1 diastolic dysfunction with normal EF. Symptoms responded to Lasix.  Newly diagnosed diabetes mellitus A1c of 6.5. Recommend diet adherence.  UTI Pt c/o dysuria on 1/13. UA pos for UTI. Will discharge on 3 days of oral cipro. Cultures ordered as add on. Follow at SNF   Procedures:  Laparoscopic cholecystectomy on 1/8  MRI thoracic spine  Consultations:  Neurology   CCS  Discharge Exam: Filed Vitals:   03/30/14 1053  BP: 151/99  Pulse:   Temp:   Resp:     General: Elderly female in no acute distress HEENT: No pallor, moist oral mucosa Chest: Clear to auscultate bilaterally Cardiovascular: Normal S1 and S2, no murmurs rub or gallop Abdomen: Soft, nondistended, nontender, laproscopic site clean Extremities: Warm, no edema, normal strength over bilateral lower extremities, reports diminished sensations in b/l legs CNS: Alert and oriented,  Discharge Instructions    Current Discharge Medication List    START taking these medications   Details  docusate sodium 100 MG CAPS Take 100 mg by mouth 2 (two) times daily. Qty: 10 capsule, Refills: 0    feeding supplement, ENSURE COMPLETE, (ENSURE  COMPLETE) LIQD Take 237 mLs by mouth 2 (two) times daily between meals. Qty: 60 Bottle, Refills: 0    gabapentin (NEURONTIN) 100 MG capsule Take 1 capsule (100 mg total) by mouth 2 (two) times daily. Qty: 30 capsule, Refills: 0         polyethylene glycol (MIRALAX / GLYCOLAX) packet Take 17 g by mouth daily. Qty: 14 each, Refills: 0    Tab Ciprofloxacin 500 mg tablet         Take 1 tablet 2 times a day for 3 days (starting 03/30/2014)    CONTINUE these medications which have CHANGED   Details  ALPRAZolam (XANAX) 0.5 MG tablet Take 1 tablet (0.5 mg total) by mouth 3 (three) times daily as needed for anxiety. Qty: 30 tablet, Refills: 0   Hydralazine 25 mg tablet                          take 1 tablet 25 mg by mouth 3 times a day                                                                     Qty: 60, no refills   CONTINUE these medications which have NOT CHANGED   Details  acetaminophen (TYLENOL) 500 MG tablet Take 500 mg by mouth  every 6 (six) hours as needed for mild pain.    metoprolol (LOPRESSOR) 100 MG tablet Take 100 mg by mouth 2 (two) times daily.    ondansetron (ZOFRAN) 4 MG tablet Take 4 mg by mouth every 8 (eight) hours as needed for nausea or vomiting.    traMADol (ULTRAM) 50 MG tablet Take 50 mg by mouth 3 (three) times daily as needed for moderate pain.      STOP taking these medications     atorvastatin (LIPITOR) 20 MG tablet      ciprofloxacin (CIPRO) 250 MG tablet      cyclobenzaprine (FLEXERIL) 10 MG tablet      furosemide (LASIX) 20 MG tablet        No Known Allergies Follow-up Information    Please follow up.   Why:  MD at SNF      Follow up with [facility]. Schedule an appointment as soon as possible for a visit in 2 weeks.   Contact information:   [address] [place] [address] [phone]      Follow up with Rosario Jacks., Anne Hahn, MD. Schedule an appointment as soon as possible for a visit in  2 weeks.   Specialty:  General Surgery   Contact information:   [place] [address] [phone]        The results of significant diagnostics from this hospitalization (including imaging, microbiology, ancillary and laboratory) are listed below for reference.    Significant Diagnostic Studies: Dg Chest 2 View  03/24/2014   CLINICAL DATA:  Chest pain for 3 days.  EXAM: CHEST  2 VIEW  COMPARISON:  CT scan of March 22, 2014.  FINDINGS: The heart size and mediastinal contours are within normal limits. Both lungs are clear. No pneumothorax or pleural effusion is noted. Compression deformity of lower thoracic vertebral body is noted consistent with old fracture.  IMPRESSION: No acute cardiopulmonary abnormality seen.   Electronically Signed   By: Sabino Dick M.D.   On: 03/24/2014 20:01   Dg Cholangiogram Operative  03/25/2014   CLINICAL DATA:  69 year old female with cholecystectomy.  EXAM: INTRAOPERATIVE CHOLANGIOGRAM  COMPARISON:  CT 03/22/2014  FINDINGS: Limited intraoperative fluoroscopic spot images of the upper abdomen during cholecystectomy.  Images demonstrate cannulation of the cystic duct at the level of surgical clips.  There is antegrade infusion through the cystic duct, with partial opacification of intrahepatic ducts at the liver hilum. Contrast fills the common hepatic duct and common bile duct, as well as partially within the pancreatic duct.  Contrast flows cross the ampulla into the duodenum.  There are a few is rounded mobile filling defects of the common hepatic duct.  No extraluminal contrast.  Surgical sponge projects over the upper abdomen. Retained contrast within the colon.  IMPRESSION: Intraoperative cholangiogram demonstrates cannulation of the cystic duct, and antegrade infusion of contrast into the common hepatic duct, common bile duct, and across the ampulla. Contrast enters the duodenum, with unremarkable caliber of the extrahepatic ducts.  Mobile  filling defects of the common hepatic duct may represent air bubbles or debris.  Surgical sponge projects over the midline.  Please refer to the dictated operative report for full details of intraoperative findings and procedure.  Signed,  Dulcy Fanny. Earleen Newport, DO  [facility]  [facility]   Electronically Signed   By: Corrie Mckusick D.O.   On: 03/25/2014 15:35   Mr Thoracic Spine Wo Contrast  03/28/2014   CLINICAL DATA:  Neuropathy. Fall. New lower extremity weakness and decreased sensation in the feet bilaterally.  EXAM: MRI THORACIC SPINE WITHOUT CONTRAST  TECHNIQUE: Multiplanar, multisequence MR imaging of the thoracic spine was performed. No intravenous contrast was administered.  COMPARISON:  Two-view chest x-ray 03/24/2014.  FINDINGS: A remote anterior fracture is present at L1. Schmorl's nodes are present at T12-L1 and L1-2. There is focal kyphosis at the at L1 level. Normal signal is present throughout the thoracic spinal cord. As rightward curvature of thoracic spine centered at T6.  No significant focal central canal stenosis is present. Small disc protrusions are present.  Mild right foraminal narrowing is present at T10-11 and T11-12 due to facet spurring at these levels. Mild left foraminal narrowing is present at T6-7. There is foraminal narrowing bilaterally at C7-T1 and T1-2, worse on the right.  Atherosclerotic changes are noted in the aorta without aneurysm. A large hiatal hernia is present.  IMPRESSION: 1. Exaggerated kyphosis centered at L1 were a remote fracture is evident. 2. Rightward curvature of the thoracic spine is centered at T6. 3. No significant central canal stenosis. 4. Mild foraminal disease as described.   Electronically Signed   By: Lawrence Santiago M.D.   On: 03/28/2014 15:56   US Abdomen Complete  03/24/2014   CLINICAL DATA:  Chest pain.  Hypertension.  EXAM: ULTRASOUND ABDOMEN COMPLETE  COMPARISON:  CT abdomen and pelvis 03/22/2014.   FINDINGS: Gallbladder: Multiple stones in the dependent gallbladder, largest measuring 1.2 cm diameter. No gallbladder wall thickening or edema. Murphy's sign is negative.  Common bile duct: Diameter: 3.5 mm, normal  Liver: No focal lesion identified. Within normal limits in parenchymal echogenicity.  IVC: Not visualized due to overlying bowel gas.  Pancreas: Not visualized due to overlying bowel gas.  Spleen: Size and appearance within normal limits.  Right Kidney: Length: 7.9 cm. Mild diffuse parenchymal thinning and increased echotexture suggesting medical renal disease. No solid mass or hydronephrosis.  Left Kidney: Length: 9.1 cm. Mild diffuse parenchymal thinning and increased echotexture suggesting medical renal disease. No solid mass or hydronephrosis.  Abdominal aorta: Not visualized due to overlying bowel gas.  Other findings: None.  IMPRESSION: Cholelithiasis without additional changes of cholecystitis. Mild renal parenchymal changes suggesting chronic medical renal disease. No hydronephrosis.   Electronically Signed   By: Lucienne Capers M.D.   On: 03/24/2014 22:03    Microbiology: Recent Results (from the past 240 hour(s))  Urine culture     Status: None   Collection Time: 03/24/14  7:09 PM  Result Value Ref Range Status   Specimen Description URINE, CLEAN CATCH  Final   Special Requests Normal  Final   Colony Count   Final    3,000 COLONIES/ML Performed at [facility]    Culture   Final    INSIGNIFICANT GROWTH Performed at [facility]    Report Status 03/25/2014 FINAL  Final  Surgical pcr screen     Status: Abnormal   Collection Time: 03/25/14 11:19 AM  Result Value Ref Range Status   MRSA, PCR NEGATIVE NEGATIVE Final   Staphylococcus aureus POSITIVE (A) NEGATIVE Final    Comment:        The Xpert SA Assay (FDA approved for NASAL specimens in patients over 3 years of age), is one component of a comprehensive surveillance program.  Test performance  has been validated by [employer] for patients greater than or equal to 42 year old. It is not intended  to diagnose infection nor to guide or monitor treatment.      Labs: Basic Metabolic Panel:  Recent Labs Lab 03/25/14 0545  03/26/14 0459 03/26/14 1320 03/27/14 0525 03/28/14 0443 03/29/14 1140 03/30/14 0845  NA  --   < > 134*  --  138 133* 135 130*  K  --   < > 3.3*  --  3.3* 4.6 3.1* 3.6  CL  --   < > 102  --  102 98 94* 91*  CO2  --   < > 20  --  28 26 22 25   GLUCOSE  --   < > 116*  --  139* 153* 142* 127*  BUN  --   < > 11  --  12 20 28* 40*  CREATININE  --   < > 1.04  --  1.18* 1.29* 1.31* 1.45*  CALCIUM  --   < > 8.4  --  9.4 9.6 9.9 9.3  MG 1.8  --   --  1.8  --   --   --   --   < > = values in this interval not displayed. Liver Function Tests:  Recent Labs Lab 03/24/14 1914 03/25/14 0545  AST 30 53*  ALT 18 24  ALKPHOS 89 82  BILITOT 2.6* 0.9  PROT 5.9* 5.9*  ALBUMIN 3.6 3.4*    Recent Labs Lab 03/24/14 1914  LIPASE 26   No results for input(s): AMMONIA in the last 168 hours. CBC:  Recent Labs Lab 03/24/14 1914 03/25/14 0120 03/25/14 0545 03/26/14 1159 03/27/14 0525  WBC 9.7 8.2 9.4 9.4 7.7  NEUTROABS 7.6  --  7.4  --   --   HGB 14.5 12.4 13.2 12.3 13.3  HCT 42.0 37.2 39.8 37.9 40.7  MCV 85.4 83.2 84.1 85.4 85.9  PLT 183 187 186 201 228   Cardiac Enzymes:  Recent Labs Lab 03/25/14 0120 03/25/14 1013 03/29/14 1140  CKTOTAL  --   --  36  CKMB  --   --  3.1  TROPONINI 0.04* 0.03  --    BNP: BNP (last 3 results) No results for input(s): PROBNP in the last 8760 hours. CBG: No results for input(s): GLUCAP in the last 168 hours.     SignedLouellen Molder  [facility] 03/30/2014, 1:09 PM

## 2014-03-30 NOTE — Progress Notes (Signed)
Physical Therapy Treatment Patient Details Name: Anna Klein MRN: [id number] DOB: 1945/10/27 Today's Date: 03/30/2014    History of Present Illness Patient is a 69 yo female admitted 03/24/14 with abdominal pain, now s/p cholecystectomy.  Patient had an assisted fall at home pta.  Initially had decreased ROM Rt shoulder (resolved).  Patient now with new LE weakness and decreased sensation bil feet.  PMH:  HTN, HLD    PT Comments    Pt still very ataxic with little ability to coordinate balance or gait.  I'm glad she and her children decided to take the rehab route.  Follow Up Recommendations  SNF     Equipment Recommendations  Rolling walker with 5" wheels;3in1 (PT);Wheelchair (measurements PT);Wheelchair cushion (measurements PT)    Recommendations for Other Services       Precautions / Restrictions Precautions Precautions: Fall    Mobility  Bed Mobility                  Transfers Overall transfer level: Needs assistance Equipment used: Rolling walker (2 wheeled) Transfers: Sit to/from Stand Sit to Stand: Min assist         General transfer comment: Verbal cues for hand placement.  Assist to rise to standing and for balance.  Requires support from BUE's to maintain balance.  Mod assist to pivot to [facility].  Requires assist to control descent onto [facility] and into chair.  Ambulation/Gait Ambulation/Gait assistance: Min assist;Mod assist Ambulation Distance (Feet): 20 Feet (24 feet x2 and 22 feet) Assistive device: Rolling walker (2 wheeled) Gait Pattern/deviations: Step-through pattern;Ataxic Gait velocity: Decreased   General Gait Details: consistent v/tcues.  Clearly decrease proprioception with ataxia and decrease control at ankles and knees.. pt becomes more uncoordinated the more she walks.   Stairs            Wheelchair Mobility    Modified Rankin (Stroke Patients Only)       Balance Overall balance assessment: Needs assistance Sitting-balance  support: No upper extremity supported Sitting balance-Leahy Scale: Fair     Standing balance support: Bilateral upper extremity supported Standing balance-Leahy Scale: Poor Standing balance comment: must have RW                    Cognition Arousal/Alertness: Awake/alert Behavior During Therapy: WFL for tasks assessed/performed Overall Cognitive Status: Within Functional Limits for tasks assessed                      Exercises      General Comments        Pertinent Vitals/Pain Pain Assessment: No/denies pain    Home Living                      Prior Function            PT Goals (current goals can now be found in the care plan section) Acute Rehab PT Goals Patient Stated Goal: To return to independence PT Goal Formulation: With patient/family Time For Goal Achievement: 04/10/14 Potential to Achieve Goals: Good Progress towards PT goals: Progressing toward goals    Frequency  Min 4X/week    PT Plan Current plan remains appropriate    Co-evaluation             End of Session   Activity Tolerance: Patient limited by fatigue;Patient tolerated treatment well Patient left: in chair;with call bell/phone within reach     Time: 1257-1320 PT Time Calculation (min) (ACUTE ONLY):  23 min  Charges:  $Gait Training: 8-22 mins $Therapeutic Activity: 8-22 mins                    G Codes:  Functional Assessment Tool Used: Clinical judgement   Cardelia Sassano, Eliseo GumKenneth V 03/30/2014, 3:06 PM  03/30/2014  [place] BingKen Alycea Segoviano, PT [phone]  (pager)

## 2014-03-30 NOTE — ED Provider Notes (Signed)
CSN: [id number]     Arrival date & time 03/30/14  2110 History   First MD Initiated Contact with Patient 03/30/14 2116     Chief Complaint  Patient presents with  . Facial Droop     (Consider location/radiation/quality/duration/timing/severity/associated sxs/prior Treatment) HPI  This is a 69 year old female who was discharged earlier today after an admission for cholecystitis. She states that during that admission she had peripheral neuropathy that worsened. She was unable to ambulate and was subsequently discharged to nursing home. She states that tonight around 6:00 her daughter noticed some left facial drooping. She also had some right arm weakness. She has been unable to walk secondary to the prior neuropathy. She had not noted a facial droop herself. She has not had any difficulty speaking and has not noted any visual problems. The numbness has been in the lower extremities as it was and has not noted any increase or lateralized symptoms. She states that she has been doing well since her discharge and surgery as far as her stomach symptoms have been. She has not had any increased abdominal pain, nausea, vomiting, or diarrhea.  Past Medical History  Diagnosis Date  . Hypertension   . DM (diabetes mellitus)    Past Surgical History  Procedure Laterality Date  . Cholecystectomy N/A 03/25/2014    Procedure: LAPAROSCOPIC CHOLECYSTECTOMY WITH INTRAOPERATIVE CHOLANGIOGRAM;  Surgeon: Axel Filler, MD;  Location: [facility] OR;  Service: General;  Laterality: N/A;   Family History  Problem Relation Age of Onset  . Hypertension Mother   . Hypertension Father    History  Substance Use Topics  . Smoking status: Never Smoker   . Smokeless tobacco: Not on file  . Alcohol Use: No   OB History    No data available     Review of Systems  All other systems reviewed and are negative.     Allergies  Review of patient's allergies indicates no known allergies.  Home Medications   Prior to  Admission medications   Medication Sig Start Date End Date Taking? Authorizing Provider  acetaminophen (TYLENOL) 500 MG tablet Take 500 mg by mouth every 6 (six) hours as needed for mild pain.    Historical Provider, MD  ALPRAZolam Prudy Feeler) 0.5 MG tablet Take 1 tablet (0.5 mg total) by mouth 3 (three) times daily as needed for anxiety. 03/30/14   Nishant Dhungel, MD  ciprofloxacin (CIPRO) 500 MG tablet Take 1 tablet (500 mg total) by mouth 2 (two) times daily. 03/30/14   Nishant Dhungel, MD  docusate sodium 100 MG CAPS Take 100 mg by mouth 2 (two) times daily. 03/30/14   Nishant Dhungel, MD  feeding supplement, ENSURE COMPLETE, (ENSURE COMPLETE) LIQD Take 237 mLs by mouth 2 (two) times daily between meals. 03/30/14   Nishant Dhungel, MD  gabapentin (NEURONTIN) 100 MG capsule Take 1 capsule (100 mg total) by mouth 2 (two) times daily. 03/30/14   Nishant Dhungel, MD  hydrALAZINE (APRESOLINE) 25 MG tablet Take 1 tablet (25 mg total) by mouth every 8 (eight) hours. 03/30/14   Nishant Dhungel, MD  metoprolol (LOPRESSOR) 100 MG tablet Take 100 mg by mouth 2 (two) times daily.    Historical Provider, MD  ondansetron (ZOFRAN) 4 MG tablet Take 4 mg by mouth every 8 (eight) hours as needed for nausea or vomiting.    Historical Provider, MD  polyethylene glycol (MIRALAX / GLYCOLAX) packet Take 17 g by mouth daily. 03/30/14   Nishant Dhungel, MD  traMADol (ULTRAM) 50 MG tablet Take  1 tablet (50 mg total) by mouth 3 (three) times daily as needed for moderate pain. 03/30/14   Nishant Dhungel, MD   There were no vitals taken for this visit. Physical Exam  Constitutional: She is oriented to person, place, and time. She appears well-developed.  HENT:  Head: Normocephalic and atraumatic.  Right Ear: External ear normal.  Left Ear: External ear normal.  Nose: Nose normal.  Mouth/Throat: Oropharynx is clear and moist.  Drooping is noted on the left mouth Forehead is equal eyebrows raise equally  Eyes: Conjunctivae and  EOM are normal. Pupils are equal, round, and reactive to light.  Neck: Normal range of motion. Neck supple. No tracheal deviation present. No thyromegaly present.  Cardiovascular: Normal rate, regular rhythm, normal heart sounds and intact distal pulses.   Pulmonary/Chest: Effort normal and breath sounds normal.  Abdominal: Soft. Bowel sounds are normal. She exhibits distension. There is no tenderness. There is no rebound and no guarding.  Musculoskeletal: Normal range of motion.  Neurological: She is alert and oriented to person, place, and time. She has normal reflexes.  Left mouth droop Physical round react light extraocular movements intact no visual field deficits noted Right palmar drift noted Pre-sensation bilateral lower extremities left greater than right Right heel to shin abnormal left heel shin normal Ex 5 out of 5 bilateral knees flexion extension bilateral hip flexors extensors bilateral ankle flexion extension Intrinsic muscles of the hand appear normal biceps strength 5 out of 5 bilaterally and extension at elbow 5 out of 5 bilaterally  Skin: Skin is warm and dry.  Psychiatric: She has a normal mood and affect. Her behavior is normal. Judgment and thought content normal.  Nursing note and vitals reviewed.  patient heart rate is 94 history rate 16 temperature 98.4 blood pressure 172/70 oxygen saturation 99%  ED Course  Procedures (including critical care time) Labs Review Labs Reviewed  CBC - Abnormal; Notable for the following:    WBC 14.4 (*)    RBC 5.15 (*)    All other components within normal limits  DIFFERENTIAL - Abnormal; Notable for the following:    Neutro Abs 10.2 (*)    Monocytes Absolute 1.1 (*)    All other components within normal limits  COMPREHENSIVE METABOLIC PANEL - Abnormal; Notable for the following:    Sodium 133 (*)    Chloride 94 (*)    Glucose, Bld 154 (*)    BUN 49 (*)    Creatinine, Ser 1.71 (*)    ALT 46 (*)    GFR calc non Af Amer 29  (*)    GFR calc Af Amer 34 (*)    All other components within normal limits  ETHANOL  PROTIME-INR  APTT  URINE RAPID DRUG SCREEN (HOSP PERFORMED)  URINALYSIS, ROUTINE W REFLEX MICROSCOPIC  I-STAT CHEM 8, ED  I-STAT TROPOININ, ED  I-STAT TROPOININ, ED    Imaging Review Ct Head Wo Contrast  03/30/2014   CLINICAL DATA:  Code stroke. Left-sided facial droop and left arm weakness.  EXAM: CT HEAD WITHOUT CONTRAST  TECHNIQUE: Contiguous axial images were obtained from the base of the skull through the vertex without intravenous contrast.  COMPARISON:  None.  FINDINGS: There is no evidence of acute infarction, mass lesion, or intra- or extra-axial hemorrhage on CT.  Mild cerebellar atrophy is noted.  The brainstem and fourth ventricle are within normal limits. The third and lateral ventricles, and basal ganglia are unremarkable in appearance. The cerebral hemispheres are symmetric in  appearance, with normal gray-white differentiation. No mass effect or midline shift is seen.  There is no evidence of fracture; visualized osseous structures are unremarkable in appearance. The orbits are within normal limits. The paranasal sinuses and mastoid air cells are well-aerated. No significant soft tissue abnormalities are seen.  IMPRESSION: 1. No acute intracranial pathology seen on CT. 2. Mild cerebellar atrophy noted.  These results were called by telephone at the time of interpretation on 03/30/2014 at 10:05 pm to Dr. Margarita Grizzleanielle Penda Venturi, who verbally acknowledged these results.   Electronically Signed   By: Roanna RaiderJeffery  Chang M.D.   On: 03/30/2014 22:07     EKG Interpretation   Date/Time:  Wednesday March 30 2014 21:26:23 EST Ventricular Rate:  97 PR Interval:  123 QRS Duration: 88 QT Interval:  332 QTC Calculation: 422 R Axis:   69 Text Interpretation:  Sinus rhythm Anteroseptal infarct, old Nonspecific  repol abnormality, diffuse leads No significant change since 03/24/14  Confirmed by Jaysen Wey MD, Duwayne HeckANIELLE [phone]([id number])  on 03/30/2014 11:11:48 PM      MDM   Final diagnoses:  Weakness  Stroke  Acute kidney injury  UTI (lower urinary tract infection)    This is a 69 year old female who presents today after discharge earlier with left mouth droop. Patient was initiated as code stroke but further evaluation by Dr. Amada JupiterKirkpatrick reveals that there is unclear onset of symptoms. He saw and evaluated the patient and has ordered an MRI. He feels the patient likely has had a stroke but will not give TPA as stroke score is too low. He does not think that the right upper extremity is weak but thinks is secondary to some shoulder problems. The sensation has been poor in the lower extremities secondary to neuropathy which is being worked up. Neuropathy may have been exacerbated by the nitrous oxide given times surgery. Patient is to be admitted to the hospitalist service for further evaluation.  1- left facial droop- likely secondary to cva- see neurology note 2- peripheral neuropathy- 3- leukocytosis- possible uti, plan culture, cxr pending. 4- renal insufficiency/injury- question volume depletion with prerenal azotemia.  Will hydrate cautiously and follow.  Custom with Dr. Toniann FailKakrakandy and will place patient in telemetry bed.   Hilario Quarryanielle S Synai Prettyman, MD 03/31/14 [phone]

## 2014-03-30 NOTE — Progress Notes (Signed)
Code stroke called on 69 y.o with recent gall bladder surgery 1/8. Per daughters at bedside patient has had various symptoms beginning as early as last week following her surgery. She began experiencing numbness in both feet and legs 1/9. She noticed a "funny feeling"  and droop in her left eye 1/12 around 5pm as well as right arm weakness. Today around 430 pm [facility] her daughter noticed a left facial droop which has improved since arrival to ED.  Patient taken to CT scan stat, negative for acute abnormality.  NIHSS completed yielding 3 for mild left facial droop, drift in right arm and impaired sensation in both lower extremities. Per epic notes patient has chronic difficulty with range of motion in right shoulder. Due to unclear time of last seen normal patient is outside of TPA window, no acute treatment at this time. Pt for admission for full neuro work up.

## 2014-03-31 ENCOUNTER — Inpatient Hospital Stay (HOSPITAL_COMMUNITY): Payer: Medicare Other

## 2014-03-31 ENCOUNTER — Encounter (HOSPITAL_COMMUNITY): Payer: Self-pay | Admitting: Internal Medicine

## 2014-03-31 DIAGNOSIS — I10 Essential (primary) hypertension: Secondary | ICD-10-CM

## 2014-03-31 DIAGNOSIS — R29810 Facial weakness: Secondary | ICD-10-CM | POA: Diagnosis not present

## 2014-03-31 DIAGNOSIS — I5032 Chronic diastolic (congestive) heart failure: Secondary | ICD-10-CM

## 2014-03-31 DIAGNOSIS — N179 Acute kidney failure, unspecified: Secondary | ICD-10-CM

## 2014-03-31 DIAGNOSIS — R531 Weakness: Secondary | ICD-10-CM | POA: Diagnosis not present

## 2014-03-31 LAB — CBC
HCT: 42.7 % (ref 36.0–46.0)
Hemoglobin: 14.3 g/dL (ref 12.0–15.0)
MCH: 28.6 pg (ref 26.0–34.0)
MCHC: 33.5 g/dL (ref 30.0–36.0)
MCV: 85.4 fL (ref 78.0–100.0)
Platelets: 274 10*3/uL (ref 150–400)
RBC: 5 MIL/uL (ref 3.87–5.11)
RDW: 14.6 % (ref 11.5–15.5)
WBC: 11.3 10*3/uL — ABNORMAL HIGH (ref 4.0–10.5)

## 2014-03-31 LAB — COMPREHENSIVE METABOLIC PANEL
ALK PHOS: 103 U/L (ref 39–117)
ALT: 40 U/L — AB (ref 0–35)
ANION GAP: 11 (ref 5–15)
AST: 42 U/L — ABNORMAL HIGH (ref 0–37)
Albumin: 3.9 g/dL (ref 3.5–5.2)
BUN: 46 mg/dL — AB (ref 6–23)
CO2: 28 mmol/L (ref 19–32)
Calcium: 9.7 mg/dL (ref 8.4–10.5)
Chloride: 95 mEq/L — ABNORMAL LOW (ref 96–112)
Creatinine, Ser: 1.62 mg/dL — ABNORMAL HIGH (ref 0.50–1.10)
GFR, EST AFRICAN AMERICAN: 36 mL/min — AB (ref >90)
GFR, EST NON AFRICAN AMERICAN: 31 mL/min — AB (ref >90)
Glucose, Bld: 164 mg/dL — ABNORMAL HIGH (ref 70–99)
POTASSIUM: 4.6 mmol/L (ref 3.5–5.1)
Sodium: 134 mmol/L — ABNORMAL LOW (ref 135–145)
TOTAL PROTEIN: 6.7 g/dL (ref 6.0–8.3)
Total Bilirubin: 1.8 mg/dL — ABNORMAL HIGH (ref 0.3–1.2)

## 2014-03-31 LAB — GLUCOSE, CAPILLARY
GLUCOSE-CAPILLARY: 134 mg/dL — AB (ref 70–99)
GLUCOSE-CAPILLARY: 179 mg/dL — AB (ref 70–99)
Glucose-Capillary: 156 mg/dL — ABNORMAL HIGH (ref 70–99)

## 2014-03-31 LAB — C-REACTIVE PROTEIN: CRP: 0.6 mg/dL — ABNORMAL HIGH (ref <0.60)

## 2014-03-31 LAB — TSH: TSH: 3.621 u[IU]/mL (ref 0.350–4.500)

## 2014-03-31 MED ORDER — LORAZEPAM 2 MG/ML IJ SOLN
INTRAMUSCULAR | Status: AC
  Administered 2014-03-31: 1 mg
  Filled 2014-03-31: qty 1

## 2014-03-31 MED ORDER — HYDRALAZINE HCL 25 MG PO TABS
25.0000 mg | ORAL_TABLET | Freq: Three times a day (TID) | ORAL | Status: DC
  Administered 2014-03-31 – 2014-04-05 (×15): 25 mg via ORAL
  Filled 2014-03-31 (×16): qty 1

## 2014-03-31 MED ORDER — FENTANYL CITRATE 0.05 MG/ML IJ SOLN
12.5000 ug | Freq: Once | INTRAMUSCULAR | Status: AC
  Administered 2014-03-31: 12.5 ug via INTRAVENOUS
  Filled 2014-03-31: qty 2

## 2014-03-31 MED ORDER — SENNOSIDES-DOCUSATE SODIUM 8.6-50 MG PO TABS: 1.0000 | ORAL_TABLET | Freq: Every evening | ORAL | Status: DC | PRN

## 2014-03-31 MED ORDER — ENSURE COMPLETE PO LIQD: 237.0000 mL | Freq: Two times a day (BID) | ORAL | Status: DC

## 2014-03-31 MED ORDER — LORAZEPAM 2 MG/ML IJ SOLN: 1.0000 mg | Freq: Once | INTRAMUSCULAR | Status: AC

## 2014-03-31 MED ORDER — FENTANYL CITRATE 0.05 MG/ML IJ SOLN
12.5000 ug | INTRAMUSCULAR | Status: DC | PRN
  Administered 2014-03-31 (×2): 12.5 ug via INTRAVENOUS
  Filled 2014-03-31 (×2): qty 2

## 2014-03-31 MED ORDER — ASPIRIN 300 MG RE SUPP
300.0000 mg | Freq: Every day | RECTAL | Status: DC
  Administered 2014-03-31: 300 mg via RECTAL
  Filled 2014-03-31: qty 1

## 2014-03-31 MED ORDER — METOPROLOL TARTRATE 100 MG PO TABS
100.0000 mg | ORAL_TABLET | Freq: Two times a day (BID) | ORAL | Status: DC
  Administered 2014-03-31 – 2014-04-05 (×10): 100 mg via ORAL
  Filled 2014-03-31 (×11): qty 1

## 2014-03-31 MED ORDER — ALPRAZOLAM 0.5 MG PO TABS
0.5000 mg | ORAL_TABLET | Freq: Three times a day (TID) | ORAL | Status: DC | PRN
  Administered 2014-03-31 – 2014-04-03 (×3): 0.5 mg via ORAL
  Filled 2014-03-31 (×5): qty 1

## 2014-03-31 MED ORDER — ONDANSETRON HCL 4 MG PO TABS: 4.0000 mg | ORAL_TABLET | Freq: Three times a day (TID) | ORAL | Status: DC | PRN

## 2014-03-31 MED ORDER — STROKE: EARLY STAGES OF RECOVERY BOOK
Freq: Once | Status: AC
  Administered 2014-03-31: 04:00:00

## 2014-03-31 MED ORDER — GABAPENTIN 100 MG PO CAPS
100.0000 mg | ORAL_CAPSULE | Freq: Two times a day (BID) | ORAL | Status: DC
  Administered 2014-03-31 – 2014-04-05 (×11): 100 mg via ORAL
  Filled 2014-03-31 (×11): qty 1

## 2014-03-31 MED ORDER — TRAMADOL HCL 50 MG PO TABS
50.0000 mg | ORAL_TABLET | Freq: Four times a day (QID) | ORAL | Status: AC | PRN
  Administered 2014-03-31: 50 mg via ORAL
  Filled 2014-03-31: qty 1

## 2014-03-31 MED ORDER — CIPROFLOXACIN HCL 500 MG PO TABS
500.0000 mg | ORAL_TABLET | Freq: Two times a day (BID) | ORAL | Status: DC
  Administered 2014-03-31: 500 mg via ORAL
  Filled 2014-03-31: qty 1

## 2014-03-31 MED ORDER — TECHNETIUM TC 99M SULFUR COLLOID FILTERED
1.0000 | Freq: Once | INTRAVENOUS | Status: AC | PRN
  Administered 2014-03-31: 1 via INTRADERMAL

## 2014-03-31 MED ORDER — HYDRALAZINE HCL 20 MG/ML IJ SOLN: 10.0000 mg | INTRAMUSCULAR | Status: DC | PRN

## 2014-03-31 MED ORDER — POLYETHYLENE GLYCOL 3350 17 G PO PACK
17.0000 g | PACK | Freq: Every day | ORAL | Status: DC
  Administered 2014-04-01 – 2014-04-03 (×3): 17 g via ORAL
  Filled 2014-03-31 (×3): qty 1

## 2014-03-31 MED ORDER — HYDROMORPHONE HCL 2 MG PO TABS
2.0000 mg | ORAL_TABLET | ORAL | Status: DC | PRN
  Administered 2014-03-31 – 2014-04-05 (×15): 2 mg via ORAL
  Filled 2014-03-31 (×15): qty 1

## 2014-03-31 MED ORDER — INSULIN ASPART 100 UNIT/ML ~~LOC~~ SOLN
0.0000 [IU] | Freq: Three times a day (TID) | SUBCUTANEOUS | Status: DC
  Administered 2014-03-31: 2 [IU] via SUBCUTANEOUS
  Administered 2014-03-31 – 2014-04-05 (×6): 1 [IU] via SUBCUTANEOUS

## 2014-03-31 MED ORDER — DOCUSATE SODIUM 100 MG PO CAPS
100.0000 mg | ORAL_CAPSULE | Freq: Two times a day (BID) | ORAL | Status: DC
  Administered 2014-03-31 – 2014-04-03 (×6): 100 mg via ORAL
  Filled 2014-03-31 (×6): qty 1

## 2014-03-31 MED ORDER — SODIUM CHLORIDE 0.9 % IV SOLN
INTRAVENOUS | Status: DC
  Administered 2014-03-31: 800 mL via INTRAVENOUS
  Administered 2014-04-01: 1000 mL via INTRAVENOUS

## 2014-03-31 MED ORDER — TECHNETIUM TC 99M MEBROFENIN IV KIT
5.0000 | PACK | Freq: Once | INTRAVENOUS | Status: AC | PRN
  Administered 2014-03-31: 5 via INTRAVENOUS

## 2014-03-31 MED ORDER — SODIUM CHLORIDE 0.9 % IJ SOLN
10.0000 mL | INTRAMUSCULAR | Status: DC | PRN
  Administered 2014-04-01 – 2014-04-04 (×3): 10 mL
  Filled 2014-03-31 (×3): qty 40

## 2014-03-31 MED ORDER — ASPIRIN 325 MG PO TABS
325.0000 mg | ORAL_TABLET | Freq: Every day | ORAL | Status: DC
  Administered 2014-04-01 – 2014-04-05 (×5): 325 mg via ORAL
  Filled 2014-03-31 (×5): qty 1

## 2014-03-31 MED ORDER — ALPRAZOLAM 0.25 MG PO TABS
0.2500 mg | ORAL_TABLET | Freq: Every evening | ORAL | Status: AC | PRN
  Administered 2014-03-31: 0.25 mg via ORAL
  Filled 2014-03-31: qty 1

## 2014-03-31 MED ORDER — TRAMADOL HCL 50 MG PO TABS
50.0000 mg | ORAL_TABLET | Freq: Three times a day (TID) | ORAL | Status: DC | PRN
Start: 2014-03-31 — End: 2014-04-05
  Administered 2014-03-31 – 2014-04-05 (×7): 50 mg via ORAL
  Filled 2014-03-31 (×7): qty 1

## 2014-03-31 MED ORDER — IMMUNE GLOBULIN (HUMAN) 5 GM/100ML IV SOLN
400.0000 mg/kg | INTRAVENOUS | Status: DC
  Administered 2014-03-31 – 2014-04-02 (×3): 25 g via INTRAVENOUS
  Filled 2014-03-31 (×7): qty 100

## 2014-03-31 NOTE — Progress Notes (Signed)
Pt arrived to 4N29 from ED. Alert and oriented x4. Pt very restless c/o of LLE pain. Admitting MD was paged upon pt arrival to floor. Tele placed. Daughter currently at bedside. Pt and family are oriented to room and equipment. Will continue to monitor.

## 2014-03-31 NOTE — Progress Notes (Signed)
Subjective: No complaints.  Continues to have left facial droop and states she has a band sensation over her chest.   Objective: Current vital signs: BP 156/84 mmHg  Pulse 93  Temp(Src) 97.4 F (36.3 C) (Oral)  Resp 14  Ht 5\' 1"  (1.549 m)  Wt 63.685 kg (140 lb 6.4 oz)  BMI 26.54 kg/m2  SpO2 100% Vital signs in last 24 hours: Temp:  [97.2 F (36.2 C)-98.4 F (36.9 C)] 97.4 F (36.3 C) (01/14 1007) Pulse Rate:  [87-97] 93 (01/14 1007) Resp:  [12-29] 14 (01/14 1007) BP: (134-183)/(62-97) 156/84 mmHg (01/14 1007) SpO2:  [75 %-100 %] 100 % (01/14 1007) Weight:  [63.685 kg (140 lb 6.4 oz)] 63.685 kg (140 lb 6.4 oz) (01/14 0147)  Intake/Output from previous day:   Intake/Output this shift:   Nutritional status:    Neurologic Exam: Patient is awake, alert, oriented to person, place, month, year, and situation. Patient is able to give a clear and coherent history. No signs of aphasia or neglect Cranial Nerves: II: Visual Fields are full. Pupils are equal, round, and reactive to light.  III,IV, VI: EOMI without ptosis or diploplia.  V: Facial sensation is symmetric to temperature VII: Facial movement is notable for prominent weakness of her left lower face. There is some asymmetry with blinking and mild weakness in forehead. VIII: hearing is intact to voice X: Uvula elevates symmetrically XI: Shoulder shrug is symmetric. XII: tongue is midline without atrophy or fasciculations.  Motor: Tone is normal. Bulk is normal. She has full strength in the left arm. She has 2/5 weakness of shoulder abduction. In the legs she has symmetric strength with 4/5 knee flexion, 4+/5 knee extension 4/5 ankle dorsiflexion 5/5 plantar flexion. 4+/5 hip flexion bilaterally. Sensory: Sensation is decreased in a symmetric fashion below the mid calf to pinprick as well as light touch. She has intact sensation throughout her right shoulder. Deep Tendon Reflexes: Absent at the ankles and knees, 2+ at  the biceps bilaterally Plantars: Toes are downgoing bilaterally.  Cerebellar: FNF intact bilaterally  Lab Results: Basic Metabolic Panel:  Recent Labs Lab 03/25/14 0545  03/26/14 1320  03/28/14 0443 03/29/14 1140 03/30/14 0845 03/30/14 2137 03/31/14 0538  NA  --   < >  --   < > 133* 135 130* 133* 134*  K  --   < >  --   < > 4.6 3.1* 3.6 3.9 4.6  CL  --   < >  --   < > 98 94* 91* 94* 95*  CO2  --   < >  --   < > 26 22 25 24 28   GLUCOSE  --   < >  --   < > 153* 142* 127* 154* 164*  BUN  --   < >  --   < > 20 28* 40* 49* 46*  CREATININE  --   < >  --   < > 1.29* 1.31* 1.45* 1.71* 1.62*  CALCIUM  --   < >  --   < > 9.6 9.9 9.3 10.0 9.7  MG 1.8  --  1.8  --   --   --   --   --   --   < > = values in this interval not displayed.  Liver Function Tests:  Recent Labs Lab 03/24/14 1914 03/25/14 0545 03/30/14 2137 03/31/14 0538  AST 30 53* 34 42*  ALT 18 24 46* 40*  ALKPHOS 89 82 111 103  BILITOT 2.6* 0.9 0.6 1.8*  PROT 5.9* 5.9* 7.3 6.7  ALBUMIN 3.6 3.4* 4.1 3.9    Recent Labs Lab 03/24/14 1914  LIPASE 26   No results for input(s): AMMONIA in the last 168 hours.  CBC:  Recent Labs Lab 03/24/14 1914 03/25/14 0120 03/25/14 0545 03/26/14 1159 03/27/14 0525 03/30/14 2137  WBC 9.7 8.2 9.4 9.4 7.7 14.4*  NEUTROABS 7.6  --  7.4  --   --  10.2*  HGB 14.5 12.4 13.2 12.3 13.3 14.7  HCT 42.0 37.2 39.8 37.9 40.7 43.0  MCV 85.4 83.2 84.1 85.4 85.9 83.5  PLT 183 187 186 201 228 294    Cardiac Enzymes:  Recent Labs Lab 03/25/14 0120 03/25/14 1013 03/29/14 1140  CKTOTAL  --   --  36  CKMB  --   --  3.1  TROPONINI 0.04* 0.03  --     Lipid Panel: No results for input(s): CHOL, TRIG, HDL, CHOLHDL, VLDL, LDLCALC in the last 168 hours.  CBG:  Recent Labs Lab 03/31/14 0655  GLUCAP 134*    Microbiology: Results for orders placed or performed during the hospital encounter of 03/24/14  Urine culture     Status: None   Collection Time: 03/24/14  7:09 PM   Result Value Ref Range Status   Specimen Description URINE, CLEAN CATCH  Final   Special Requests Normal  Final   Colony Count   Final    3,000 COLONIES/ML Performed at [facility]    Culture   Final    INSIGNIFICANT GROWTH Performed at [facility]    Report Status 03/25/2014 FINAL  Final  Surgical pcr screen     Status: Abnormal   Collection Time: 03/25/14 11:19 AM  Result Value Ref Range Status   MRSA, PCR NEGATIVE NEGATIVE Final   Staphylococcus aureus POSITIVE (A) NEGATIVE Final    Comment:        The Xpert SA Assay (FDA approved for NASAL specimens in patients over 71 years of age), is one component of a comprehensive surveillance program.  Test performance has been validated by [facility] for patients greater than or equal to 71 year old. It is not intended to diagnose infection nor to guide or monitor treatment.     Coagulation Studies:  Recent Labs  03/30/14 2137  LABPROT 13.2  INR 0.99    Imaging: Dg Chest 2 View  03/31/2014   CLINICAL DATA:  Weakness.  EXAM: CHEST  2 VIEW  COMPARISON:  03/24/2014  FINDINGS: Borderline hyperinflation and mild elevation of right hemidiaphragm. The heart size is normal, there is tortuosity of the thoracic aorta. Pulmonary vasculature is normal. Moderate hiatal hernia is again seen. There is no consolidation. No pleural effusion or pneumothorax. Compression deformity at the lower thoracic spine, unchanged.  IMPRESSION: No acute pulmonary process.   Electronically Signed   By: Rubye OaksMelanie  Ehinger M.D.   On: 03/31/2014 01:44   Dg Abd 1 View  03/31/2014   CLINICAL DATA:  Acute onset of generalized abdominal pain and back pain. Initial encounter.  EXAM: ABDOMEN - 1 VIEW  COMPARISON:  CT of the abdomen and pelvis from 03/22/2014  FINDINGS: The visualized bowel gas pattern is unremarkable. Scattered air and contrast filled loops of colon are seen; no abnormal dilatation of small bowel loops is seen to suggest small  bowel obstruction. No free intra-abdominal air is identified, though evaluation for free air is limited on a single supine view.  There is mild rotatory  scoliosis along the lumbar spine, with associated degenerative change. The sacroiliac joints are unremarkable.  IMPRESSION: 1. Unremarkable bowel gas pattern; no free intra-abdominal air seen. 2. 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   Electronically Signed   By: Roanna Raider M.D.   On: 03/31/2014 06:45   Ct Head Wo Contrast  03/30/2014   CLINICAL DATA:  Code stroke. Left-sided facial droop and left arm weakness.  EXAM: CT HEAD WITHOUT CONTRAST  TECHNIQUE: Contiguous axial images were obtained from the base of the skull through the vertex without intravenous contrast.  COMPARISON:  None.  FINDINGS: There is no evidence of acute infarction, mass lesion, or intra- or extra-axial hemorrhage on CT.  Mild cerebellar atrophy is noted.  The brainstem and fourth ventricle are within normal limits. The third and lateral ventricles, and basal ganglia are unremarkable in appearance. The cerebral hemispheres are symmetric in appearance, with normal gray-white differentiation. No mass effect or midline shift is seen.  There is no evidence of fracture; visualized osseous structures are unremarkable in appearance. The orbits are within normal limits. The paranasal sinuses and mastoid air cells are well-aerated. No significant soft tissue abnormalities are seen.  IMPRESSION: 1. No acute intracranial pathology seen on CT. 2. Mild cerebellar atrophy noted.  These results were called by telephone at the time of interpretation on 03/30/2014 at 10:05 pm to Dr. Margarita Grizzle, who verbally acknowledged these results.   Electronically Signed   By: Roanna Raider M.D.   On: 03/30/2014 22:07   Mr Brain Wo Contrast  03/31/2014   CLINICAL DATA:  Discharged today, status post cholecystitis. Worsening of peripheral neuropathy, unable to  ambulate. Acute onset LEFT facial droop today at 6 p.m. by arm weakness.  EXAM: MRI HEAD WITHOUT CONTRAST  MRI CERVICAL SPINE WITHOUT CONTRAST  TECHNIQUE: Multiplanar, multiecho pulse sequences of the brain and surrounding structures, and cervical spine, to include the craniocervical junction and cervicothoracic junction, were obtained without intravenous contrast.  COMPARISON:  CT of the head March 30, 2014 at 2157 hr  FINDINGS: MRI HEAD FINDINGS  No reduced diffusion to suggest acute ischemia. No susceptibility artifact to suggest hemorrhage.  Ventricles and sulci are normal for patient's age. Minimal white matter changes suggest chronic small vessel ischemic disease, less than expected for age. No midline shift or mass effect.  No abnormal extra-axial fluid collections. Ocular globes and orbital contents are unremarkable though not tailored for evaluation. Paranasal sinuses the mastoid air cells are well aerated. No abnormal sellar expansion. No cerebellar tonsillar ectopia. No suspicious calvarial bone marrow signal.  MRI CERVICAL SPINE FINDINGS  Moderately motion degraded examination. Vertebral bodies appear intact and aligned with straightened cervical lordosis. C6-7 interbody arthrodesis. Moderate to severe C3-4, C4-5 degenerative disc with compensatory chronic endplate changes. Severe C5-6 degenerative disc and endplate changes, appearing chronic. No STIR signal abnormality to suggest acute osseous process.  Motion limits assessment for subtle cord signal abnormality, no cervical spinal cord syrinx. Craniocervical junction appears intact. Included prevertebral and paraspinal soft tissues are unremarkable.  Level by level evaluation (moderate to severely motion degraded axial sequences limits evaluation):  C2-3: Moderate facet arthropathy without canal stenosis or neural foraminal narrowing.  C3-4: Annular bulging, uncovertebral hypertrophy and ileus mild facet arthropathy. Mild canal stenosis. Suspected at  least mild to moderate moderate bilateral neural foraminal narrowing.  C4-5: Small broad-based disc bulge, uncovertebral hypertrophy and small LEFT central disc protrusion results in mild canal stenosis. Suspected at least moderate RIGHT neural  foraminal narrowing.  C5-6: Approximate 3 mm LEFT central disc protrusion. Uncovertebral hypertrophy and mild facet arthropathy result in moderate to severe canal stenosis, AP dimension the canal is approximately 6 mm. Suspected moderate bilateral neural foraminal narrowing.  C6-7: Moderate broad-based disc osteophyte complex, moderate canal stenosis. No definite neural foraminal narrowing.  C7-T1: Small broad-based disc bulge, uncovertebral hypertrophy without canal stenosis. Mild bilateral neural foraminal narrowing.  IMPRESSION: MRI HEAD: No acute intracranial process ; no acute ischemia. Normal noncontrast MRI of the brain for age.  MRI CERVICAL SPINE: Moderately motion degraded examination. No acute fracture nor malalignment.  C5-6 arthrodesis.  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  Neural foraminal narrowing C3-4 through C5-6, C7-T1: At least moderate on the RIGHT at C4-5 and bilaterally at C5-6.   Electronically Signed   By: Awilda Metro   On: 03/31/2014 01:17   Mr Cervical Spine Wo Contrast  03/31/2014   CLINICAL DATA:  Discharged today, status post cholecystitis. Worsening of peripheral neuropathy, unable to ambulate. Acute onset LEFT facial droop today at 6 p.m. by arm weakness.  EXAM: MRI HEAD WITHOUT CONTRAST  MRI CERVICAL SPINE WITHOUT CONTRAST  TECHNIQUE: Multiplanar, multiecho pulse sequences of the brain and surrounding structures, and cervical spine, to include the craniocervical junction and cervicothoracic junction, were obtained without intravenous contrast.  COMPARISON:  CT of the head March 30, 2014 at 2157 hr  FINDINGS: MRI HEAD FINDINGS  No  reduced diffusion to suggest acute ischemia. No susceptibility artifact to suggest hemorrhage.  Ventricles and sulci are normal for patient's age. Minimal white matter changes suggest chronic small vessel ischemic disease, less than expected for age. No midline shift or mass effect.  No abnormal extra-axial fluid collections. Ocular globes and orbital contents are unremarkable though not tailored for evaluation. Paranasal sinuses the mastoid air cells are well aerated. No abnormal sellar expansion. No cerebellar tonsillar ectopia. No suspicious calvarial bone marrow signal.  MRI CERVICAL SPINE FINDINGS  Moderately motion degraded examination. Vertebral bodies appear intact and aligned with straightened cervical lordosis. C6-7 interbody arthrodesis. Moderate to severe C3-4, C4-5 degenerative disc with compensatory chronic endplate changes. Severe C5-6 degenerative disc and endplate changes, appearing chronic. No STIR signal abnormality to suggest acute osseous process.  Motion limits assessment for subtle cord signal abnormality, no cervical spinal cord syrinx. Craniocervical junction appears intact. Included prevertebral and paraspinal soft tissues are unremarkable.  Level by level evaluation (moderate to severely motion degraded axial sequences limits evaluation):  C2-3: Moderate facet arthropathy without canal stenosis or neural foraminal narrowing.  C3-4: Annular bulging, uncovertebral hypertrophy and ileus mild facet arthropathy. Mild canal stenosis. Suspected at least mild to moderate moderate bilateral neural foraminal narrowing.  C4-5: Small broad-based disc bulge, uncovertebral hypertrophy and small LEFT central disc protrusion results in mild canal stenosis. Suspected at least moderate RIGHT neural foraminal narrowing.  C5-6: Approximate 3 mm LEFT central disc protrusion. Uncovertebral hypertrophy and mild facet arthropathy result in moderate to severe canal stenosis, AP dimension the canal is approximately  6 mm. Suspected moderate bilateral neural foraminal narrowing.  C6-7: Moderate broad-based disc osteophyte complex, moderate canal stenosis. No definite neural foraminal narrowing.  C7-T1: Small broad-based disc bulge, uncovertebral hypertrophy without canal stenosis. Mild bilateral neural foraminal narrowing.  IMPRESSION: MRI HEAD: No acute intracranial process ; no acute ischemia. Normal noncontrast MRI of the brain for age.  MRI CERVICAL SPINE: Moderately motion degraded examination. No acute fracture  nor malalignment.  C5-6 arthrodesis.  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  Neural foraminal narrowing C3-4 through C5-6, C7-T1: At least moderate on the RIGHT at C4-5 and bilaterally at C5-6.   Electronically Signed   By: Awilda Metro   On: 03/31/2014 01:17    Medications:  Scheduled: . aspirin  300 mg Rectal Daily   Or  . aspirin  325 mg Oral Daily  . docusate sodium  100 mg Oral BID  . feeding supplement (ENSURE COMPLETE)  237 mL Oral BID BM  . gabapentin  100 mg Oral BID  . hydrALAZINE  25 mg Oral 3 times per day  . insulin aspart  0-9 Units Subcutaneous TID WC  . metoprolol  100 mg Oral BID  . polyethylene glycol  17 g Oral Daily    Assessment/Plan:  Bilateral LE weakness and decreased sensation now presenting with left facial droop of unknown etiology.  Differential includes: polyradiculoneuropathy as opposed to just polyneuropathy versus AIDP.  MRI C-spine and brain show no acute abnormalities. LP attempted at bedside and was unsuccessful. Will have Fluoro guided LP.   Recommend: 1) LP by Fluoroscopy 2) MMA, CRP, ANA--pending  Will continue to follow.   Felicie Morn PA-C [facility] [phone]  03/31/2014, 11:13 AM

## 2014-03-31 NOTE — Progress Notes (Signed)
Peripherally Inserted Central Catheter/Midline Placement  The IV Nurse has discussed with the patient and/or persons authorized to consent for the patient, the purpose of this procedure and the potential benefits and risks involved with this procedure.  The benefits include less needle sticks, lab draws from the catheter and patient may be discharged home with the catheter.  Risks include, but not limited to, infection, bleeding, blood clot (thrombus formation), and puncture of an artery; nerve damage and irregular heat beat.  Alternatives to this procedure were also discussed.  PICC/Midline Placement Documentation        Lisabeth DevoidGibbs, Zoriana Oats Jeanette 03/31/2014, 3:12 PM Consent obtained by Merleen Millinerhea Duncan, RN, CRNI

## 2014-03-31 NOTE — H&P (Addendum)
[facility] History and Physical  Keisy Strickler ZOX:[id number] DOB: 1945/03/30 DOA: 03/30/2014  Referring physician: ER physician. PCP: Amada Jupiter, PA-C   Chief Complaint: Left facial droop.  HPI: Anna Klein is a 69 y.o. female who was recently admitted for abdominal pain and has had cholecystectomy and at that time patient was complaining of lower extremity cramping and numbness and had neurology workup done and was discharged to nursing home yesterday but patient's daughter noted that patient has been having some left sided facial weakness. Initially patient's daughter noticed that patient was not able to close her left eyelid and when she reached the nursing home note is that patient was having left facial droop. Patient has some weakness in the right upper extremity which was noticed earlier. Patient was brought to the ER and CT head did not show anything acute and MRI brain was done which also did not show any stroke. At this time patient has been admitted for further management. Patient denies any chest pain or shortness of breath. Patient still has abdominal discomfort mainly in the upper back. Denies any nausea vomiting or diarrhea. Patient still has the numbness and tingling of the lower extremities up to the midcalf. Has poor deep tendon reflexes. Patient's daughter states that [place] when patient had some crabs and lobster following which patient started developing abdominal discomfort and tingling and numbness of the lower extremity and some choking episodes.  Review of Systems: As presented in the history of presenting illness, rest negative.  Past Medical History  Diagnosis Date  . Hypertension   . DM (diabetes mellitus)    Past Surgical History  Procedure Laterality Date  . Cholecystectomy N/A 03/25/2014    Procedure: LAPAROSCOPIC CHOLECYSTECTOMY WITH INTRAOPERATIVE CHOLANGIOGRAM;  Surgeon: Axel Filler, MD;  Location:  [facility] OR;  Service: General;  Laterality: N/A;   Social History:  reports that she has never smoked. She does not have any smokeless tobacco history on file. She reports that she does not drink alcohol or use illicit drugs. Where does patient live home but just discharged to rehabilitation. Can patient participate in ADLs? Not sure at this time.  No Known Allergies  Family History:  Family History  Problem Relation Age of Onset  . Hypertension Mother   . Hypertension Father       Prior to Admission medications   Medication Sig Start Date End Date Taking? Authorizing Provider  acetaminophen (TYLENOL) 500 MG tablet Take 500 mg by mouth every 6 (six) hours as needed for mild pain.   Yes Historical Provider, MD  ALPRAZolam Prudy Feeler) 0.5 MG tablet Take 1 tablet (0.5 mg total) by mouth 3 (three) times daily as needed for anxiety. 03/30/14  Yes Nishant Dhungel, MD  ciprofloxacin (CIPRO) 500 MG tablet Take 1 tablet (500 mg total) by mouth 2 (two) times daily. 03/30/14  Yes Nishant Dhungel, MD  docusate sodium 100 MG CAPS Take 100 mg by mouth 2 (two) times daily. 03/30/14  Yes Nishant Dhungel, MD  feeding supplement, ENSURE COMPLETE, (ENSURE COMPLETE) LIQD Take 237 mLs by mouth 2 (two) times daily between meals. 03/30/14  Yes Nishant Dhungel, MD  gabapentin (NEURONTIN) 100 MG capsule Take 1 capsule (100 mg total) by mouth 2 (two) times daily. 03/30/14  Yes Nishant Dhungel, MD  hydrALAZINE (APRESOLINE) 25 MG tablet Take 1 tablet (25 mg total) by mouth every 8 (eight) hours. 03/30/14  Yes Nishant Dhungel, MD  metoprolol (LOPRESSOR) 100 MG tablet Take  100 mg by mouth 2 (two) times daily.   Yes Historical Provider, MD  ondansetron (ZOFRAN) 4 MG tablet Take 4 mg by mouth every 8 (eight) hours as needed for nausea or vomiting.   Yes Historical Provider, MD  polyethylene glycol (MIRALAX / GLYCOLAX) packet Take 17 g by mouth daily. 03/30/14  Yes Nishant Dhungel, MD  traMADol (ULTRAM) 50 MG tablet Take 1 tablet (50 mg  total) by mouth 3 (three) times daily as needed for moderate pain. 03/30/14  Yes Nishant Dhungel, MD    Physical Exam: Filed Vitals:   03/30/14 2300 03/31/14 0147 03/31/14 0316 03/31/14 0400  BP: 134/73 175/75  175/86  Pulse: 87 94  96  Temp:  97.8 F (36.6 C)  98 F (36.7 C)  TempSrc:  Oral  Oral  Resp: 12 14  12   Height:  5\' 1"  (1.549 m)    Weight:  63.685 kg (140 lb 6.4 oz)    SpO2: 97% 98% 75% 100%     General:  Moderately built and nourished.  Eyes: Anicteric no pallor.  ENT: No discharge from the ears eyes nose or mouth.  Neck: No neck rigidity.  Cardiovascular: S1 and S2 heard.  Respiratory: No rhonchi or crepitations.  Abdomen: Soft nontender bowel sounds present. Scars of recent surgery seen.  Skin: No rash.  Musculoskeletal: No edema. Has mild weakness of the right upper extremity.  Psychiatric: Appears normal but restless.  Neurologic: Alert awake oriented to time place and person. Left facial droop. Mild weakness of the right approximately. Deep tendon reflexes are poor.  Labs on Admission:  Basic Metabolic Panel:  Recent Labs Lab 03/25/14 0545  03/26/14 1320 03/27/14 0525 03/28/14 0443 03/29/14 1140 03/30/14 0845 03/30/14 2137  NA  --   < >  --  138 133* 135 130* 133*  K  --   < >  --  3.3* 4.6 3.1* 3.6 3.9  CL  --   < >  --  102 98 94* 91* 94*  CO2  --   < >  --  28 26 22 25 24   GLUCOSE  --   < >  --  139* 153* 142* 127* 154*  BUN  --   < >  --  12 20 28* 40* 49*  CREATININE  --   < >  --  1.18* 1.29* 1.31* 1.45* 1.71*  CALCIUM  --   < >  --  9.4 9.6 9.9 9.3 10.0  MG 1.8  --  1.8  --   --   --   --   --   < > = values in this interval not displayed. Liver Function Tests:  Recent Labs Lab 03/24/14 1914 03/25/14 0545 03/30/14 2137  AST 30 53* 34  ALT 18 24 46*  ALKPHOS 89 82 111  BILITOT 2.6* 0.9 0.6  PROT 5.9* 5.9* 7.3  ALBUMIN 3.6 3.4* 4.1    Recent Labs Lab 03/24/14 1914  LIPASE 26   No results for input(s): AMMONIA in  the last 168 hours. CBC:  Recent Labs Lab 03/24/14 1914 03/25/14 0120 03/25/14 0545 03/26/14 1159 03/27/14 0525 03/30/14 2137  WBC 9.7 8.2 9.4 9.4 7.7 14.4*  NEUTROABS 7.6  --  7.4  --   --  10.2*  HGB 14.5 12.4 13.2 12.3 13.3 14.7  HCT 42.0 37.2 39.8 37.9 40.7 43.0  MCV 85.4 83.2 84.1 85.4 85.9 83.5  PLT 183 187 186 201 228 294   Cardiac Enzymes:  Recent Labs Lab 03/25/14 0120 03/25/14 1013 03/29/14 1140  CKTOTAL  --   --  36  CKMB  --   --  3.1  TROPONINI 0.04* 0.03  --     BNP (last 3 results) No results for input(s): PROBNP in the last 8760 hours. CBG: No results for input(s): GLUCAP in the last 168 hours.  Radiological Exams on Admission: Dg Chest 2 View  03/31/2014   CLINICAL DATA:  Weakness.  EXAM: CHEST  2 VIEW  COMPARISON:  03/24/2014  FINDINGS: Borderline hyperinflation and mild elevation of right hemidiaphragm. The heart size is normal, there is tortuosity of the thoracic aorta. Pulmonary vasculature is normal. Moderate hiatal hernia is again seen. There is no consolidation. No pleural effusion or pneumothorax. Compression deformity at the lower thoracic spine, unchanged.  IMPRESSION: No acute pulmonary process.   Electronically Signed   By: Rubye Oaks M.D.   On: 03/31/2014 01:44   Ct Head Wo Contrast  03/30/2014   CLINICAL DATA:  Code stroke. Left-sided facial droop and left arm weakness.  EXAM: CT HEAD WITHOUT CONTRAST  TECHNIQUE: Contiguous axial images were obtained from the base of the skull through the vertex without intravenous contrast.  COMPARISON:  None.  FINDINGS: There is no evidence of acute infarction, mass lesion, or intra- or extra-axial hemorrhage on CT.  Mild cerebellar atrophy is noted.  The brainstem and fourth ventricle are within normal limits. The third and lateral ventricles, and basal ganglia are unremarkable in appearance. The cerebral hemispheres are symmetric in appearance, with normal gray-white differentiation. No mass effect or  midline shift is seen.  There is no evidence of fracture; visualized osseous structures are unremarkable in appearance. The orbits are within normal limits. The paranasal sinuses and mastoid air cells are well-aerated. No significant soft tissue abnormalities are seen.  IMPRESSION: 1. No acute intracranial pathology seen on CT. 2. Mild cerebellar atrophy noted.  These results were called by telephone at the time of interpretation on 03/30/2014 at 10:05 pm to Dr. Margarita Grizzle, who verbally acknowledged these results.   Electronically Signed   By: Roanna Raider M.D.   On: 03/30/2014 22:07   Mr Brain Wo Contrast  03/31/2014   CLINICAL DATA:  Discharged today, status post cholecystitis. Worsening of peripheral neuropathy, unable to ambulate. Acute onset LEFT facial droop today at 6 p.m. by arm weakness.  EXAM: MRI HEAD WITHOUT CONTRAST  MRI CERVICAL SPINE WITHOUT CONTRAST  TECHNIQUE: Multiplanar, multiecho pulse sequences of the brain and surrounding structures, and cervical spine, to include the craniocervical junction and cervicothoracic junction, were obtained without intravenous contrast.  COMPARISON:  CT of the head March 30, 2014 at 2157 hr  FINDINGS: MRI HEAD FINDINGS  No reduced diffusion to suggest acute ischemia. No susceptibility artifact to suggest hemorrhage.  Ventricles and sulci are normal for patient's age. Minimal white matter changes suggest chronic small vessel ischemic disease, less than expected for age. No midline shift or mass effect.  No abnormal extra-axial fluid collections. Ocular globes and orbital contents are unremarkable though not tailored for evaluation. Paranasal sinuses the mastoid air cells are well aerated. No abnormal sellar expansion. No cerebellar tonsillar ectopia. No suspicious calvarial bone marrow signal.  MRI CERVICAL SPINE FINDINGS  Moderately motion degraded examination. Vertebral bodies appear intact and aligned with straightened cervical lordosis. C6-7 interbody  arthrodesis. Moderate to severe C3-4, C4-5 degenerative disc with compensatory chronic endplate changes. Severe C5-6 degenerative disc and endplate changes, appearing chronic. No STIR signal abnormality to  suggest acute osseous process.  Motion limits assessment for subtle cord signal abnormality, no cervical spinal cord syrinx. Craniocervical junction appears intact. Included prevertebral and paraspinal soft tissues are unremarkable.  Level by level evaluation (moderate to severely motion degraded axial sequences limits evaluation):  C2-3: Moderate facet arthropathy without canal stenosis or neural foraminal narrowing.  C3-4: Annular bulging, uncovertebral hypertrophy and ileus mild facet arthropathy. Mild canal stenosis. Suspected at least mild to moderate moderate bilateral neural foraminal narrowing.  C4-5: Small broad-based disc bulge, uncovertebral hypertrophy and small LEFT central disc protrusion results in mild canal stenosis. Suspected at least moderate RIGHT neural foraminal narrowing.  C5-6: Approximate 3 mm LEFT central disc protrusion. Uncovertebral hypertrophy and mild facet arthropathy result in moderate to severe canal stenosis, AP dimension the canal is approximately 6 mm. Suspected moderate bilateral neural foraminal narrowing.  C6-7: Moderate broad-based disc osteophyte complex, moderate canal stenosis. No definite neural foraminal narrowing.  C7-T1: Small broad-based disc bulge, uncovertebral hypertrophy without canal stenosis. Mild bilateral neural foraminal narrowing.  IMPRESSION: MRI HEAD: No acute intracranial process ; no acute ischemia. Normal noncontrast MRI of the brain for age.  MRI CERVICAL SPINE: Moderately motion degraded examination. No acute fracture nor malalignment.  C5-6 arthrodesis.  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  Neural foraminal narrowing C3-4 through C5-6, C7-T1: At  least moderate on the RIGHT at C4-5 and bilaterally at C5-6.   Electronically Signed   By: Awilda Metroourtnay  Bloomer   On: 03/31/2014 01:17   Mr Cervical Spine Wo Contrast  03/31/2014   CLINICAL DATA:  Discharged today, status post cholecystitis. Worsening of peripheral neuropathy, unable to ambulate. Acute onset LEFT facial droop today at 6 p.m. by arm weakness.  EXAM: MRI HEAD WITHOUT CONTRAST  MRI CERVICAL SPINE WITHOUT CONTRAST  TECHNIQUE: Multiplanar, multiecho pulse sequences of the brain and surrounding structures, and cervical spine, to include the craniocervical junction and cervicothoracic junction, were obtained without intravenous contrast.  COMPARISON:  CT of the head March 30, 2014 at 2157 hr  FINDINGS: MRI HEAD FINDINGS  No reduced diffusion to suggest acute ischemia. No susceptibility artifact to suggest hemorrhage.  Ventricles and sulci are normal for patient's age. Minimal white matter changes suggest chronic small vessel ischemic disease, less than expected for age. No midline shift or mass effect.  No abnormal extra-axial fluid collections. Ocular globes and orbital contents are unremarkable though not tailored for evaluation. Paranasal sinuses the mastoid air cells are well aerated. No abnormal sellar expansion. No cerebellar tonsillar ectopia. No suspicious calvarial bone marrow signal.  MRI CERVICAL SPINE FINDINGS  Moderately motion degraded examination. Vertebral bodies appear intact and aligned with straightened cervical lordosis. C6-7 interbody arthrodesis. Moderate to severe C3-4, C4-5 degenerative disc with compensatory chronic endplate changes. Severe C5-6 degenerative disc and endplate changes, appearing chronic. No STIR signal abnormality to suggest acute osseous process.  Motion limits assessment for subtle cord signal abnormality, no cervical spinal cord syrinx. Craniocervical junction appears intact. Included prevertebral and paraspinal soft tissues are unremarkable.  Level by level  evaluation (moderate to severely motion degraded axial sequences limits evaluation):  C2-3: Moderate facet arthropathy without canal stenosis or neural foraminal narrowing.  C3-4: Annular bulging, uncovertebral hypertrophy and ileus mild facet arthropathy. Mild canal stenosis. Suspected at least mild to moderate moderate bilateral neural foraminal narrowing.  C4-5: Small broad-based disc bulge, uncovertebral hypertrophy and small LEFT central disc protrusion results in mild canal stenosis.  Suspected at least moderate RIGHT neural foraminal narrowing.  C5-6: Approximate 3 mm LEFT central disc protrusion. Uncovertebral hypertrophy and mild facet arthropathy result in moderate to severe canal stenosis, AP dimension the canal is approximately 6 mm. Suspected moderate bilateral neural foraminal narrowing.  C6-7: Moderate broad-based disc osteophyte complex, moderate canal stenosis. No definite neural foraminal narrowing.  C7-T1: Small broad-based disc bulge, uncovertebral hypertrophy without canal stenosis. Mild bilateral neural foraminal narrowing.  IMPRESSION: MRI HEAD: No acute intracranial process ; no acute ischemia. Normal noncontrast MRI of the brain for age.  MRI CERVICAL SPINE: Moderately motion degraded examination. No acute fracture nor malalignment.  C5-6 arthrodesis.  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  Neural foraminal narrowing C3-4 through C5-6, C7-T1: At least moderate on the RIGHT at C4-5 and bilaterally at C5-6.   Electronically Signed   By: Awilda Metro   On: 03/31/2014 01:17    EKG: Independently reviewed. 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  Assessment/Plan Principal Problem:   Facial droop Active Problems:   DM type 2 (diabetes mellitus, type 2)   Chronic diastolic heart failure   Acute kidney injury   1. 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 Amada Jupiter. MRI brain has been negative. Differentials include B-12 deficiency and AIDP. Further recommendations per neurologist. Patient will be kept on neuro checks. 2. Acute renal failure - closely follow metabolic panel. If creatinine tends to worsen and patient will need IV fluids. 3. Uncontrolled hypertension - continue home medications and will place patient on when necessary IV hydralazine. Closely follow blood pressure trends. 4. Chronic diastolic heart failure recent 2-D echo done last week showed EF of 55% with grade 1 diastolic dysfunction - presently appears compensated and no Lasix for now since patient's renal function is worsening. 5. Hyperglycemia with recently diagnosed diabetes mellitus type 2 hemoglobin A1c was 6.5 - patient was on multiple dose of steroids as per patient's daughter for gout. For now I have placed patient on sliding scale coverage and closely follow CBGs. 6. Recent cholecystectomy - patient still has some abdominal discomfort radiating to the back. Closely observe. Will get a KUB. AST ALT has not been elevated. If pain persists may need repeat CT scan.  Addendum - Patient has upper back pain. Discussed with surgery.Will get HIDA scan to rule out bile leak. Patiwnt will be kept NPO.   DVT Prophylaxis SCDs. Anticipating possible procedures.  Code Status: Full code.  Family Communication: Patient's daughter.  Disposition Plan: Admit to inpatient.    Gaberiel Youngblood N. [facility] Pager [phone].  If 7PM-7AM, please contact night-coverage [email or web address] Password [email or web address] 03/31/2014, 5:14 AM

## 2014-03-31 NOTE — ED Notes (Signed)
Pt still at MRI

## 2014-03-31 NOTE — Procedures (Signed)
LP Procedure Note:  Patient has been seen and examined.  Chart has been reviewed.  LP is being performed to evaluate CSF for possible GBS.  Procedure has been explained to patient/family including risks and benefits.  Consent has been signed by patient/family and witnessed.   Blood pressure 156/84, pulse 93, temperature 97.4 F (36.3 C), temperature source Oral, resp. rate 14, height 5\' 1"  (1.549 m), weight 63.685 kg (140 lb 6.4 oz), SpO2 100 %.   Current facility-administered medications:  .  0.9 %  sodium chloride infusion, , Intravenous, Continuous, Richarda OverlieNayana Abrol, MD .  ALPRAZolam Prudy Feeler(XANAX) tablet 0.5 mg, 0.5 mg, Oral, TID PRN, Eduard ClosArshad N Kakrakandy, MD .  aspirin suppository 300 mg, 300 mg, Rectal, Daily **OR** aspirin tablet 325 mg, 325 mg, Oral, Daily, Eduard ClosArshad N Kakrakandy, MD .  docusate sodium (COLACE) capsule 100 mg, 100 mg, Oral, BID, Eduard ClosArshad N Kakrakandy, MD .  feeding supplement (ENSURE COMPLETE) (ENSURE COMPLETE) liquid 237 mL, 237 mL, Oral, BID BM, Eduard ClosArshad N Kakrakandy, MD .  fentaNYL (SUBLIMAZE) injection 12.5 mcg, 12.5 mcg, Intravenous, Q2H PRN, Richarda OverlieNayana Abrol, MD .  gabapentin (NEURONTIN) capsule 100 mg, 100 mg, Oral, BID, Eduard ClosArshad N Kakrakandy, MD .  hydrALAZINE (APRESOLINE) injection 10 mg, 10 mg, Intravenous, Q4H PRN, Eduard ClosArshad N Kakrakandy, MD .  hydrALAZINE (APRESOLINE) tablet 25 mg, 25 mg, Oral, 3 times per day, Eduard ClosArshad N Kakrakandy, MD, 25 mg at 03/31/14 0520 .  insulin aspart (novoLOG) injection 0-9 Units, 0-9 Units, Subcutaneous, TID WC, Eduard ClosArshad N Kakrakandy, MD, 1 Units at 03/31/14 [phone] .  metoprolol (LOPRESSOR) tablet 100 mg, 100 mg, Oral, BID, Eduard ClosArshad N Kakrakandy, MD .  ondansetron [facility](ZOFRAN) tablet 4 mg, 4 mg, Oral, Q8H PRN, Eduard ClosArshad N Kakrakandy, MD .  polyethylene glycol (MIRALAX / GLYCOLAX) packet 17 g, 17 g, Oral, Daily, Eduard ClosArshad N Kakrakandy, MD .  senna-docusate (Senokot-S) tablet 1 tablet, 1 tablet, Oral, QHS PRN, Eduard ClosArshad N Kakrakandy, MD .  traMADol Janean Sark(ULTRAM) tablet 50 mg, 50 mg,  Oral, TID PRN, Eduard ClosArshad N Kakrakandy, MD   Recent Labs  03/30/14 2137  WBC 14.4*  HGB 14.7  HCT 43.0  PLT 294  INR 0.99    MRI brain of head:  IMPRESSION: MRI HEAD: No acute intracranial process ; no acute ischemia. Normal noncontrast MRI of the brain for age.   Patient was placed in the lateral decub/sitting position.  Area was cleaned with betadine and anesthetized with lidocaine.  Under sterile conditions 20G LP needle was placed at approximately L3-4 without difficulty. No fluid was obtained.  No complications were noted.  Patient will need LP under fluoroscopy  Felicie MornDavid Smith PA-C [facility] [phone]  Agree with above.  Venetia MaxonR Dereke Neumann M.D. [facility] [phone]  03/31/2014, 11:27 AM

## 2014-03-31 NOTE — Clinical Social Work Psychosocial (Signed)
Clinical Social Work Department BRIEF PSYCHOSOCIAL ASSESSMENT 03/31/2014  Patient:  Yeagle,Modesty     Account Number:  [id number]     Admit date:  03/30/2014  Clinical Social Worker:  Marciano Sequin  Date/Time:  03/31/2014 10:42 AM  Referred by:  RN  Date Referred:  03/31/2014 Referred for  SNF Placement   Other Referral:   Interview type:  Patient Other interview type:    PSYCHOSOCIAL DATA Living Status:  FACILITY Admitted from facility:  [facility] Level of care:  [place] Primary support name:  Cheri Fowler and Dorrene German Primary support relationship to patient:  CHILD, ADULT Degree of support available:   [employer] [facility]    CURRENT CONCERNS Current Concerns  None Noted   Other Concerns:    SOCIAL WORK ASSESSMENT / PLAN CSW met pt at bedside. CSW introduced self and purpose of visit. Pt reported she was discharge from the hospital yesterday to [facility]. Pt reported that her daughters [name] and Threasa Beards has placed a bed hold, so she can return back. CSW provided the pt with contact information for further questions. CSW will continue to follow this pt and assist with discharge as needed.   Assessment/plan status:  Psychosocial Support/Ongoing Assessment of Needs Other assessment/ plan:   Information/referral to community resources:    PATIENT'S/FAMILY'S RESPONSE TO PLAN OF CARE: Pt presented with a normal affect and calm mood. Pt oriented 4x. Pt reported not wanting to have a extended stay in the hospital, since she has been hospitalized for 6 days prior to being discharge yesterday. Pt expressed the need to continue with her rehab so she can return home.   [place], MSW, [place]

## 2014-03-31 NOTE — Progress Notes (Signed)
UR completed 

## 2014-03-31 NOTE — Progress Notes (Addendum)
Patient seen and examined this morning  Agree with Eduard ClosArshad N Kakrakandy, MD assessment and plan Discussed with neurology  Failed bedside LP ,MMA, CRP, ANA--pending Plan is to do LP under fluoroscopy Clinically dehydrated therefore start the patient on IV hydration Requested PICC line placement because of inability to draw blood, need for further IV treatment Start the patient on IV fentanyl every 2 hours when necessary for pain control Status post laparoscopy cholecystectomy with continued abdominal pain scheduled to have HIDA  scan to rule out bile leak  Requested [facility] to see pt for abdominal pain as she is post op lap chole

## 2014-03-31 NOTE — ED Notes (Signed)
Pt still at mri  

## 2014-04-01 ENCOUNTER — Inpatient Hospital Stay (HOSPITAL_COMMUNITY): Payer: Medicare Other

## 2014-04-01 DIAGNOSIS — R531 Weakness: Secondary | ICD-10-CM | POA: Diagnosis not present

## 2014-04-01 DIAGNOSIS — R29810 Facial weakness: Secondary | ICD-10-CM | POA: Diagnosis not present

## 2014-04-01 LAB — COMPREHENSIVE METABOLIC PANEL
ALT: 28 U/L (ref 0–35)
ANION GAP: 5 (ref 5–15)
AST: 23 U/L (ref 0–37)
Albumin: 3 g/dL — ABNORMAL LOW (ref 3.5–5.2)
Alkaline Phosphatase: 86 U/L (ref 39–117)
BILIRUBIN TOTAL: 0.8 mg/dL (ref 0.3–1.2)
BUN: 27 mg/dL — ABNORMAL HIGH (ref 6–23)
CO2: 28 mmol/L (ref 19–32)
CREATININE: 1.09 mg/dL (ref 0.50–1.10)
Calcium: 8.5 mg/dL (ref 8.4–10.5)
Chloride: 98 mEq/L (ref 96–112)
GFR calc Af Amer: 58 mL/min — ABNORMAL LOW (ref >90)
GFR calc non Af Amer: 50 mL/min — ABNORMAL LOW (ref >90)
GLUCOSE: 124 mg/dL — AB (ref 70–99)
Potassium: 3.2 mmol/L — ABNORMAL LOW (ref 3.5–5.1)
Sodium: 131 mmol/L — ABNORMAL LOW (ref 135–145)
Total Protein: 6.4 g/dL (ref 6.0–8.3)

## 2014-04-01 LAB — URINALYSIS, ROUTINE W REFLEX MICROSCOPIC
Bilirubin Urine: NEGATIVE
Glucose, UA: >1000 mg/dL — AB
HGB URINE DIPSTICK: NEGATIVE
Ketones, ur: NEGATIVE mg/dL
NITRITE: NEGATIVE
Protein, ur: NEGATIVE mg/dL
SPECIFIC GRAVITY, URINE: 1.02 (ref 1.005–1.030)
UROBILINOGEN UA: 0.2 mg/dL (ref 0.0–1.0)
pH: 7 (ref 5.0–8.0)

## 2014-04-01 LAB — CBC
HCT: 35.2 % — ABNORMAL LOW (ref 36.0–46.0)
Hemoglobin: 11.8 g/dL — ABNORMAL LOW (ref 12.0–15.0)
MCH: 28.8 pg (ref 26.0–34.0)
MCHC: 33.5 g/dL (ref 30.0–36.0)
MCV: 85.9 fL (ref 78.0–100.0)
Platelets: 207 10*3/uL (ref 150–400)
RBC: 4.1 MIL/uL (ref 3.87–5.11)
RDW: 14.7 % (ref 11.5–15.5)
WBC: 6.3 10*3/uL (ref 4.0–10.5)

## 2014-04-01 LAB — PROTEIN, CSF: Total  Protein, CSF: 192 mg/dL — ABNORMAL HIGH (ref 15–45)

## 2014-04-01 LAB — HEAVY METALS, BLOOD: Arsenic: 66 mcg/L — ABNORMAL HIGH (ref <23)

## 2014-04-01 LAB — GLUCOSE, CAPILLARY
GLUCOSE-CAPILLARY: 130 mg/dL — AB (ref 70–99)
GLUCOSE-CAPILLARY: 128 mg/dL — AB (ref 70–99)
GLUCOSE-CAPILLARY: 93 mg/dL (ref 70–99)
Glucose-Capillary: 97 mg/dL (ref 70–99)

## 2014-04-01 LAB — URINE CULTURE

## 2014-04-01 LAB — GLUCOSE, CSF: Glucose, CSF: 66 mg/dL (ref 43–76)

## 2014-04-01 LAB — URINE MICROSCOPIC-ADD ON

## 2014-04-01 LAB — CSF CELL COUNT WITH DIFFERENTIAL
Eosinophils, CSF: NONE SEEN % (ref 0–1)
RBC Count, CSF: 31 /mm3 — ABNORMAL HIGH
Tube #: 3
WBC, CSF: 2 /mm3 (ref 0–5)

## 2014-04-01 LAB — LIPASE, BLOOD: LIPASE: 57 U/L (ref 11–59)

## 2014-04-01 LAB — MAGNESIUM: MAGNESIUM: 2.1 mg/dL (ref 1.5–2.5)

## 2014-04-01 LAB — VITAMIN B1: Vitamin B1 (Thiamine): 8 nmol/L (ref 8–30)

## 2014-04-01 LAB — ANA: Anti Nuclear Antibody(ANA): NEGATIVE

## 2014-04-01 MED ORDER — CIPROFLOXACIN HCL 500 MG PO TABS
500.0000 mg | ORAL_TABLET | ORAL | Status: DC
  Administered 2014-04-01 – 2014-04-02 (×2): 500 mg via ORAL
  Filled 2014-04-01 (×2): qty 1

## 2014-04-01 MED ORDER — POTASSIUM CHLORIDE IN NACL 40-0.9 MEQ/L-% IV SOLN
INTRAVENOUS | Status: DC
  Administered 2014-04-01 – 2014-04-02 (×2): 75 mL/h via INTRAVENOUS
  Filled 2014-04-01 (×3): qty 1000

## 2014-04-01 MED ORDER — GLUCERNA SHAKE PO LIQD
237.0000 mL | Freq: Two times a day (BID) | ORAL | Status: DC
  Administered 2014-04-01 – 2014-04-05 (×8): 237 mL via ORAL

## 2014-04-01 NOTE — Progress Notes (Signed)
INITIAL NUTRITION ASSESSMENT  DOCUMENTATION CODES Per approved criteria  -Not Applicable   INTERVENTION: Provide Glucerna Shakes BID, each supplement provides 220 kcal and 10 grams of protein Provide Snacks TID Provide Magic Cup once daily with dinner, provides 290 kcal and 9 grams of protein  NUTRITION DIAGNOSIS: Inadequate oral intake related to poor appetite and taste changes as evidenced by <25% meal completion and 3.5% weight loss in less than 2 weeks.   Goal: Pt to meet >/= 90% of their estimated nutrition needs   Monitor:  PO intake, supplement acceptance, weight trend, labs  Reason for Assessment: Malnutrition Screening Tool, score of 3  69 y.o. female  Admitting Dx: Facial droop  ASSESSMENT: 69 y.o. Female with history of diabetes and hypertension who was recently admitted for abdominal pain and has had cholecystectomy and at that time patient was complaining of lower extremity cramping and numbness and had neurology workup done and was discharged to nursing home yesterday but patient's daughter noted that patient has been having some left sided facial weakness.   Pt states that for the past 1-2 weeks she has had a decreased appetite, mostly due to things tasting funny. She reports eating only 25% of her usual meals. Yesterday she ate a few bites of biscuit, a few bites of egg, and a half a cup of chicken noodle soup. She has Ensure Complete ordered BID which she states she likes and has been drinking.  Labs: low sodium, low potassium, glucose ranging 97-179 mg/dL   Nutrition Focused Physical Exam:  Subcutaneous Fat:  Orbital Region: wnl Upper Arm Region: wnl Thoracic and Lumbar Region: wnl  Muscle:  Temple Region: wnl Clavicle Bone Region: wnl Clavicle and Acromion Bone Region: wnl Scapular Bone Region: NA Dorsal Hand: wnl Patellar Region: wnl Anterior Thigh Region: wnl Posterior Calf Region: wnl  Edema: none noted   Height: Ht Readings from Last 1  Encounters:  03/31/14  (1.549 m)    Weight: Wt Readings from Last 1 Encounters:  03/31/14 140 lb 6.4 oz (63.685 kg)    Ideal Body Weight: 105 lbs  % Ideal Body Weight: 133%  Wt Readings from Last 10 Encounters:  03/31/14 140 lb 6.4 oz (63.685 kg)  03/30/14 138 lb 11.2 oz (62.914 kg)  03/24/14 150 lb  Usual Body Weight: 145-150 lb  % Usual Body Weight: 96.5%  BMI:  Body mass index is 26.54 kg/(m^2).  Estimated Nutritional Needs: Kcal: 1500-1700 Protein: 75-85 grams Fluid: 1.5-1.7 L/day  Skin: closed abdominal incision  Diet Order: Diet Carb Modified  EDUCATION NEEDS: -No education needs identified at this time  No intake or output data in the 24 hours ending 04/01/14 1316  Last BM: 1/12   Labs:   Recent Labs Lab 03/26/14 1320  03/30/14 2137 03/31/14 0538 04/01/14 0525  NA  --   < > 133* 134* 131*  K  --   < > 3.9 4.6 3.2*  CL  --   < > 94* 95* 98  CO2  --   < > BUN  --   < > 49* 46* 27*  CREATININE  --   < > 1.71* 1.62* 1.09  CALCIUM  --   < > 10.0 9.7 8.5  MG 1.8  --   --   --  2.1  GLUCOSE  --   < > 154* 164* 124*  < > = values in this interval not displayed.  CBG (last 3)   Recent Labs  03/31/14 2143 04/01/14 0649 04/01/14 1201  GLUCAP 156* 130* 97    Scheduled Meds: . aspirin  300 mg Rectal Daily   Or  . aspirin  325 mg Oral Daily  . ciprofloxacin  500 mg Oral Q24H  . docusate sodium  100 mg Oral BID  . feeding supplement (ENSURE COMPLETE)  237 mL Oral BID BM  . gabapentin  100 mg Oral BID  . hydrALAZINE  25 mg Oral 3 times per day  . Immune Globulin 5%  400 mg/kg Intravenous Q24 Hr x 5  . insulin aspart  0-9 Units Subcutaneous TID WC  . metoprolol  100 mg Oral BID  . polyethylene glycol  17 g Oral Daily    Continuous Infusions: . 0.9 % NaCl with KCl 40 mEq / L 75 mL/hr (04/01/14 16100955)    Past Medical History  Diagnosis Date  . Hypertension   . DM (diabetes mellitus)     Past Surgical History  Procedure  Laterality Date  . Cholecystectomy N/A 03/25/2014    Procedure: LAPAROSCOPIC CHOLECYSTECTOMY WITH INTRAOPERATIVE CHOLANGIOGRAM;  Surgeon: Axel FillerArmando Ramirez, MD;  Location: [facility] OR;  Service: General;  Laterality: N/A;    Ian Malkineanne Barnett RD, LDN Inpatient Clinical Dietitian Pager: [phone] After Hours Pager: [phone]

## 2014-04-01 NOTE — Progress Notes (Signed)
Subjective:  Patient has no new complaints.  Objective: Current vital signs: BP 163/67 mmHg  Pulse 93  Temp(Src) 97.5 F (36.4 C) (Oral)  Resp 20  Ht  (1.549 m)  Wt 63.685 kg (140 lb 6.4 oz)  BMI 26.54 kg/m2  SpO2 100% Vital signs in last 24 hours: Temp:  [97.5 F (36.4 C)-97.9 F (36.6 C)] 97.5 F (36.4 C) (01/15 1022) Pulse Rate:  [72-117] 93 (01/15 1022) Resp:  [16-20] 20 (01/15 1022) BP: (107-177)/(50-86) 163/67 mmHg (01/15 1022) SpO2:  [96 %-100 %] 100 % (01/15 1022)  Intake/Output from previous day:   Intake/Output this shift:   Nutritional status: Diet Carb Modified  Neurologic Exam: General: NAD Mental Status: Alert, oriented, thought content appropriate.  Speech fluent without evidence of aphasia.  Able to follow 3 step commands without difficulty. Cranial Nerves: II: Visual fields grossly normal, pupils equal, round, reactive to light and accommodation III,IV, VI: ptosis not present --decreased lid closure on the left, extra-ocular motions intact bilaterally V,VII: smile asymmetric on the left, facial light touch sensation normal bilaterally VIII: hearing normal bilaterally IX,X: gag reflex present XI: bilateral shoulder shrug XII: midline tongue extension without atrophy or fasciculations  Motor: Right : Upper extremity   5/5    Left:     Upper extremity   5/5  Lower extremity   4/5     Lower extremity   4/5 Tone and bulk:normal tone throughout; no atrophy noted Sensory: Pinprick and light touch decreased from feet to mid calf Deep Tendon Reflexes:  Right: Upper Extremity   Left: Upper extremity   biceps (C-5 to C-6) 2/4   biceps (C-5 to C-6) 2/4 tricep (C7) 2/4    triceps (C7) 2/4 Brachioradialis (C6) 2/4  Brachioradialis (C6) 2/4  Lower Extremity Lower Extremity  quadriceps (L-2 to L-4) 0/4   quadriceps (L-2 to L-4) 0/4 Achilles (S1) 0/4   Achilles (S1) 0/4  Plantars: Right: downgoing   Left: downgoing     Lab Results: Basic Metabolic  Panel:  Recent Labs Lab 03/26/14 1320  03/29/14 1140 03/30/14 0845 03/30/14 2137 03/31/14 0538 04/01/14 0525  NA  --   < > 135 130* 133* 134* 131*  K  --   < > 3.1* 3.6 3.9 4.6 3.2*  CL  --   < > 94* 91* 94* 95* 98  CO2  --   < > GLUCOSE  --   < > 142* 127* 154* 164* 124*  BUN  --   < > 28* 40* 49* 46* 27*  CREATININE  --   < > 1.31* 1.45* 1.71* 1.62* 1.09  CALCIUM  --   < > 9.9 9.3 10.0 9.7 8.5  MG 1.8  --   --   --   --   --  2.1  < > = values in this interval not displayed.  Liver Function Tests:  Recent Labs Lab 03/30/14 2137 03/31/14 0538 04/01/14 0525  AST 34 42* 23  ALT 46* 40* 28  ALKPHOS 111 103 86  BILITOT 0.6 1.8* 0.8  PROT 7.3 6.7 6.4  ALBUMIN 4.1 3.9 3.0*   No results for input(s): LIPASE, AMYLASE in the last 168 hours. No results for input(s): AMMONIA in the last 168 hours.  CBC:  Recent Labs Lab 03/26/14 1159 03/27/14 0525 03/30/14 2137 03/31/14 1136 04/01/14 0525  WBC 9.4 7.7 14.4* 11.3* 6.3  NEUTROABS  --   --  10.2*  --   --  HGB 12.3 13.3 14.7 14.3 11.8*  HCT 37.9 40.7 43.0 42.7 35.2*  MCV 85.4 85.9 83.5 85.4 85.9  PLT 201 228 294 274 207    Cardiac Enzymes:  Recent Labs Lab 03/29/14 1140  CKTOTAL 36  CKMB 3.1    Lipid Panel: No results for input(s): CHOL, TRIG, HDL, CHOLHDL, VLDL, LDLCALC in the last 168 hours.  CBG:  Recent Labs Lab 03/31/14 0655 03/31/14 1629 03/31/14 2143 04/01/14 0649  GLUCAP 134* 179* 156* 130*    Microbiology: Results for orders placed or performed during the hospital encounter of 03/24/14  Urine culture     Status: None   Collection Time: 03/24/14  7:09 PM  Result Value Ref Range Status   Specimen Description URINE, CLEAN CATCH  Final   Special Requests Normal  Final   Colony Count   Final    3,000 COLONIES/ML Performed at [facility]    Culture   Final    INSIGNIFICANT GROWTH Performed at [facility]    Report Status 03/25/2014 FINAL  Final   Surgical pcr screen     Status: Abnormal   Collection Time: 03/25/14 11:19 AM  Result Value Ref Range Status   MRSA, PCR NEGATIVE NEGATIVE Final   Staphylococcus aureus POSITIVE (A) NEGATIVE Final    Comment:        The Xpert SA Assay (FDA approved for NASAL specimens in patients over 97 years of age), is one component of a comprehensive surveillance program.  Test performance has been validated by [employer] for patients greater than or equal to 25 year old. It is not intended to diagnose infection nor to guide or monitor treatment.   Culture, Urine     Status: None (Preliminary result)   Collection Time: 03/30/14  2:32 PM  Result Value Ref Range Status   Specimen Description URINE, CLEAN CATCH  Final   Special Requests ADDED [id number]  Final   Colony Count   Final    >=100,000 COLONIES/ML Performed at [facility]    Culture   Final    ESCHERICHIA COLI Performed at [facility]    Report Status PENDING  Incomplete    Coagulation Studies:  Recent Labs  03/30/14 2137  LABPROT 13.2  INR 0.99    Imaging: Dg Chest 2 View  03/31/2014   CLINICAL DATA:  Weakness.  EXAM: CHEST  2 VIEW  COMPARISON:  03/24/2014  FINDINGS: Borderline hyperinflation and mild elevation of right hemidiaphragm. The heart size is normal, there is tortuosity of the thoracic aorta. Pulmonary vasculature is normal. Moderate hiatal hernia is again seen. There is no consolidation. No pleural effusion or pneumothorax. Compression deformity at the lower thoracic spine, unchanged.  IMPRESSION: No acute pulmonary process.   Electronically Signed   By: Rubye Oaks M.D.   On: 03/31/2014 01:44   Dg Abd 1 View  03/31/2014   CLINICAL DATA:  Acute onset of generalized abdominal pain and back pain. Initial encounter.  EXAM: ABDOMEN - 1 VIEW  COMPARISON:  CT of the abdomen and pelvis from 03/22/2014  FINDINGS: The visualized bowel gas pattern is unremarkable. Scattered air and contrast  filled loops of colon are seen; no abnormal dilatation of small bowel loops is seen to suggest small bowel obstruction. No free intra-abdominal air is identified, though evaluation for free air is limited on a single supine view.  There is mild rotatory scoliosis along the lumbar spine, with associated degenerative change. The sacroiliac joints are unremarkable.  IMPRESSION: 1. Unremarkable bowel gas pattern; no free intra-abdominal air seen. 2. 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   Electronically Signed   By: Roanna Raider M.D.   On: 03/31/2014 06:45   Ct Head Wo Contrast  03/30/2014   CLINICAL DATA:  Code stroke. Left-sided facial droop and left arm weakness.  EXAM: CT HEAD WITHOUT CONTRAST  TECHNIQUE: Contiguous axial images were obtained from the base of the skull through the vertex without intravenous contrast.  COMPARISON:  None.  FINDINGS: There is no evidence of acute infarction, mass lesion, or intra- or extra-axial hemorrhage on CT.  Mild cerebellar atrophy is noted.  The brainstem and fourth ventricle are within normal limits. The third and lateral ventricles, and basal ganglia are unremarkable in appearance. The cerebral hemispheres are symmetric in appearance, with normal gray-white differentiation. No mass effect or midline shift is seen.  There is no evidence of fracture; visualized osseous structures are unremarkable in appearance. The orbits are within normal limits. The paranasal sinuses and mastoid air cells are well-aerated. No significant soft tissue abnormalities are seen.  IMPRESSION: 1. No acute intracranial pathology seen on CT. 2. Mild cerebellar atrophy noted.  These results were called by telephone at the time of interpretation on 03/30/2014 at 10:05 pm to Dr. Margarita Grizzle, who verbally acknowledged these results.   Electronically Signed   By: Roanna Raider M.D.   On: 03/30/2014 22:07   Mr Brain Wo Contrast  03/31/2014   CLINICAL  DATA:  Discharged today, status post cholecystitis. Worsening of peripheral neuropathy, unable to ambulate. Acute onset LEFT facial droop today at 6 p.m. by arm weakness.  EXAM: MRI HEAD WITHOUT CONTRAST  MRI CERVICAL SPINE WITHOUT CONTRAST  TECHNIQUE: Multiplanar, multiecho pulse sequences of the brain and surrounding structures, and cervical spine, to include the craniocervical junction and cervicothoracic junction, were obtained without intravenous contrast.  COMPARISON:  CT of the head March 30, 2014 at 2157 hr  FINDINGS: MRI HEAD FINDINGS  No reduced diffusion to suggest acute ischemia. No susceptibility artifact to suggest hemorrhage.  Ventricles and sulci are normal for patient's age. Minimal white matter changes suggest chronic small vessel ischemic disease, less than expected for age. No midline shift or mass effect.  No abnormal extra-axial fluid collections. Ocular globes and orbital contents are unremarkable though not tailored for evaluation. Paranasal sinuses the mastoid air cells are well aerated. No abnormal sellar expansion. No cerebellar tonsillar ectopia. No suspicious calvarial bone marrow signal.  MRI CERVICAL SPINE FINDINGS  Moderately motion degraded examination. Vertebral bodies appear intact and aligned with straightened cervical lordosis. C6-7 interbody arthrodesis. Moderate to severe C3-4, C4-5 degenerative disc with compensatory chronic endplate changes. Severe C5-6 degenerative disc and endplate changes, appearing chronic. No STIR signal abnormality to suggest acute osseous process.  Motion limits assessment for subtle cord signal abnormality, no cervical spinal cord syrinx. Craniocervical junction appears intact. Included prevertebral and paraspinal soft tissues are unremarkable.  Level by level evaluation (moderate to severely motion degraded axial sequences limits evaluation):  C2-3: Moderate facet arthropathy without canal stenosis or neural foraminal narrowing.  C3-4: Annular  bulging, uncovertebral hypertrophy and ileus mild facet arthropathy. Mild canal stenosis. Suspected at least mild to moderate moderate bilateral neural foraminal narrowing.  C4-5: Small broad-based disc bulge, uncovertebral hypertrophy and small LEFT central disc protrusion results in mild canal stenosis. Suspected at least moderate RIGHT neural foraminal narrowing.  C5-6: Approximate 3 mm LEFT central disc protrusion. Uncovertebral hypertrophy and mild  facet arthropathy result in moderate to severe canal stenosis, AP dimension the canal is approximately 6 mm. Suspected moderate bilateral neural foraminal narrowing.  C6-7: Moderate broad-based disc osteophyte complex, moderate canal stenosis. No definite neural foraminal narrowing.  C7-T1: Small broad-based disc bulge, uncovertebral hypertrophy without canal stenosis. Mild bilateral neural foraminal narrowing.  IMPRESSION: MRI HEAD: No acute intracranial process ; no acute ischemia. Normal noncontrast MRI of the brain for age.  MRI CERVICAL SPINE: Moderately motion degraded examination. No acute fracture nor malalignment.  C5-6 arthrodesis.  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  Neural foraminal narrowing C3-4 through C5-6, C7-T1: At least moderate on the RIGHT at C4-5 and bilaterally at C5-6.   Electronically Signed   By: Awilda Metroourtnay  Bloomer   On: 03/31/2014 01:17   Mr Cervical Spine Wo Contrast  03/31/2014   CLINICAL DATA:  Discharged today, status post cholecystitis. Worsening of peripheral neuropathy, unable to ambulate. Acute onset LEFT facial droop today at 6 p.m. by arm weakness.  EXAM: MRI HEAD WITHOUT CONTRAST  MRI CERVICAL SPINE WITHOUT CONTRAST  TECHNIQUE: Multiplanar, multiecho pulse sequences of the brain and surrounding structures, and cervical spine, to include the craniocervical junction and cervicothoracic junction, were obtained without intravenous  contrast.  COMPARISON:  CT of the head March 30, 2014 at 2157 hr  FINDINGS: MRI HEAD FINDINGS  No reduced diffusion to suggest acute ischemia. No susceptibility artifact to suggest hemorrhage.  Ventricles and sulci are normal for patient's age. Minimal white matter changes suggest chronic small vessel ischemic disease, less than expected for age. No midline shift or mass effect.  No abnormal extra-axial fluid collections. Ocular globes and orbital contents are unremarkable though not tailored for evaluation. Paranasal sinuses the mastoid air cells are well aerated. No abnormal sellar expansion. No cerebellar tonsillar ectopia. No suspicious calvarial bone marrow signal.  MRI CERVICAL SPINE FINDINGS  Moderately motion degraded examination. Vertebral bodies appear intact and aligned with straightened cervical lordosis. C6-7 interbody arthrodesis. Moderate to severe C3-4, C4-5 degenerative disc with compensatory chronic endplate changes. Severe C5-6 degenerative disc and endplate changes, appearing chronic. No STIR signal abnormality to suggest acute osseous process.  Motion limits assessment for subtle cord signal abnormality, no cervical spinal cord syrinx. Craniocervical junction appears intact. Included prevertebral and paraspinal soft tissues are unremarkable.  Level by level evaluation (moderate to severely motion degraded axial sequences limits evaluation):  C2-3: Moderate facet arthropathy without canal stenosis or neural foraminal narrowing.  C3-4: Annular bulging, uncovertebral hypertrophy and ileus mild facet arthropathy. Mild canal stenosis. Suspected at least mild to moderate moderate bilateral neural foraminal narrowing.  C4-5: Small broad-based disc bulge, uncovertebral hypertrophy and small LEFT central disc protrusion results in mild canal stenosis. Suspected at least moderate RIGHT neural foraminal narrowing.  C5-6: Approximate 3 mm LEFT central disc protrusion. Uncovertebral hypertrophy and mild  facet arthropathy result in moderate to severe canal stenosis, AP dimension the canal is approximately 6 mm. Suspected moderate bilateral neural foraminal narrowing.  C6-7: Moderate broad-based disc osteophyte complex, moderate canal stenosis. No definite neural foraminal narrowing.  C7-T1: Small broad-based disc bulge, uncovertebral hypertrophy without canal stenosis. Mild bilateral neural foraminal narrowing.  IMPRESSION: MRI HEAD: No acute intracranial process ; no acute ischemia. Normal noncontrast MRI of the brain for age.  MRI CERVICAL SPINE: Moderately motion degraded examination. No acute fracture nor malalignment.  C5-6 arthrodesis.  Degenerative change of the cervical spine results in apparent  moderate to severe canal stenosis at C5-6, moderate canal stenosis at C6-7.  Neural foraminal narrowing C3-4 through C5-6, C7-T1: At least moderate on the RIGHT at C4-5 and bilaterally at C5-6.   Electronically Signed   By: Awilda Metro   On: 03/31/2014 01:17   Nm Hepatobiliary Liver Func  03/31/2014   CLINICAL DATA:  Status post cholecystectomy 4 days ago, abdominal pain  EXAM: NUCLEAR MEDICINE HEPATOBILIARY IMAGING  TECHNIQUE: Sequential images of the abdomen were obtained out to 60 minutes following intravenous administration of radiopharmaceutical.  RADIOPHARMACEUTICALS:  [employer] Tc-58m Choletec  COMPARISON:  CT scan of the abdomen and pelvis of March 22, 2014, and supine abdominal film of today's date.  FINDINGS: There is adequate uptake of the radiopharmaceutical by the liver. Activity is visible in the common bile duct and bowel by 20 min. There is transient low-level activity over the inferior aspect of the right hepatic lobe on the 25 and 30 min images which is never again visualized out to 120 min. This is due to the patient intermittently moving her arm contained injection site over the field-of-view. There is no increasing accumulation of activity outside of bowel to suggest a bile leak.   IMPRESSION: There are no findings suspicious for a bile leak. There is no common bile duct obstruction.   Electronically Signed   By: Tylisha Danis  [place]   On: 03/31/2014 15:49    Medications:  Scheduled: . aspirin  300 mg Rectal Daily   Or  . aspirin  325 mg Oral Daily  . ciprofloxacin  500 mg Oral Q24H  . docusate sodium  100 mg Oral BID  . feeding supplement (ENSURE COMPLETE)  237 mL Oral BID BM  . gabapentin  100 mg Oral BID  . hydrALAZINE  25 mg Oral 3 times per day  . Immune Globulin 5%  400 mg/kg Intravenous Q24 Hr x 5  . insulin aspart  0-9 Units Subcutaneous TID WC  . metoprolol  100 mg Oral BID  . polyethylene glycol  17 g Oral Daily    Assessment/Plan: Bilateral LE weakness and decreased sensation now presenting with left facial droop.  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 Differential for polyneuropathy includes effects of Arsenic versus AIDP.  Will still obtain LP under fluoroscopy to rule out possible AIDP.  Primary team has contacted poison control and pateitn has received one dose of chelation.  Will continue with Ivig daily for 4 more days.   Plan:  1) 24 hour urine Arsenic collection 2) LP under fluoroscopy 3) IVig for 4 more doses.  4) PT      Felicie Morn PA-C [facility] [phone]  04/01/2014, 11:54 AM

## 2014-04-01 NOTE — Clinical Social Work Note (Addendum)
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 CSW spoke with the pt's daughter Anna Klein. Anna [place] reported [place]  [address] on Dec 31st to Jan 3rd where the pt ate shell fish. [name] reported the pt never eat shell fish, but the pt wanted to try something new. [name] reported that she has contacted poision control to test the family's water (the pt's, her and her sister).   CSW updated by the MD that poison control has been notified at [phone]. CSW provided updated information regarding the family trip to [place] with poison control. Anna [place] reported that she provided the  MD with a list of steps needed in order to classify this as true arsenic poisoning.    [name], MSW, LCSWA [phone]

## 2014-04-01 NOTE — Progress Notes (Signed)
[facility] Progress Note     Subjective: We were asked to see the patient in a post op visit given she continued to c/o abdominal pain since surgery.  Today her pain is minimal to resolved.  No N/V, tolerating diet.  Labs are improved, HIDA yesterday negative.  Family at bedside says she's feeling much better.  Pending LP shortly.  No complaints about incision sites.    Objective: Vital signs in last 24 hours: Temp:  [97.5 F (36.4 C)-97.9 F (36.6 C)] 97.5 F (36.4 C) (01/15 1022) Pulse Rate:  [72-117] 93 (01/15 1022) Resp:  [16-20] 20 (01/15 1022) BP: (107-177)/(50-92) 163/67 mmHg (01/15 1022) SpO2:  [96 %-100 %] 100 % (01/15 1022) Last BM Date: 03/29/14  Intake/Output from previous day:   Intake/Output this shift:    PE: Gen:  Alert, NAD, pleasant Abd: Soft, NT/ND, +BS, no HSM, incision sites healing well still with steri-strips in place   Lab Results:   Recent Labs  03/31/14 1136 04/01/14 0525  WBC 11.3* 6.3  HGB 14.3 11.8*  HCT 42.7 35.2*  PLT 274 207   BMET  Recent Labs  03/31/14 0538 04/01/14 0525  NA 134* 131*  K 4.6 3.2*  CL 95* 98  CO2 28 28  GLUCOSE 164* 124*  BUN 46* 27*  CREATININE 1.62* 1.09  CALCIUM 9.7 8.5   PT/INR  Recent Labs  03/30/14 2137  LABPROT 13.2  INR 0.99   CMP     Component Value Date/Time   NA 131* 04/01/2014 0525   K 3.2* 04/01/2014 0525   CL 98 04/01/2014 0525   CO2 28 04/01/2014 0525   GLUCOSE 124* 04/01/2014 0525   BUN 27* 04/01/2014 0525   CREATININE 1.09 04/01/2014 0525   CALCIUM 8.5 04/01/2014 0525   PROT 6.4 04/01/2014 0525   ALBUMIN 3.0* 04/01/2014 0525   AST 23 04/01/2014 0525   ALT 28 04/01/2014 0525   ALKPHOS 86 04/01/2014 0525   BILITOT 0.8 04/01/2014 0525   GFRNONAA 50* 04/01/2014 0525   GFRAA 58* 04/01/2014 0525   Lipase     Component Value Date/Time   LIPASE 26 03/24/2014 1914       Studies/Results: Dg Chest 2 View  03/31/2014   CLINICAL DATA:  Weakness.  EXAM:  CHEST  2 VIEW  COMPARISON:  03/24/2014  FINDINGS: Borderline hyperinflation and mild elevation of right hemidiaphragm. The heart size is normal, there is tortuosity of the thoracic aorta. Pulmonary vasculature is normal. Moderate hiatal hernia is again seen. There is no consolidation. No pleural effusion or pneumothorax. Compression deformity at the lower thoracic spine, unchanged.  IMPRESSION: No acute pulmonary process.   Electronically Signed   By: Rubye Oaks M.D.   On: 03/31/2014 01:44   Dg Abd 1 View  03/31/2014   CLINICAL DATA:  Acute onset of generalized abdominal pain and back pain. Initial encounter.  EXAM: ABDOMEN - 1 VIEW  COMPARISON:  CT of the abdomen and pelvis from 03/22/2014  FINDINGS: The visualized bowel gas pattern is unremarkable. Scattered air and contrast filled loops of colon are seen; no abnormal dilatation of small bowel loops is seen to suggest small bowel obstruction. No free intra-abdominal air is identified, though evaluation for free air is limited on a single supine view.  There is mild rotatory scoliosis along the lumbar spine, with associated degenerative change. The sacroiliac joints are unremarkable.  IMPRESSION: 1. Unremarkable bowel gas pattern; no free intra-abdominal air seen. 2. 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   Electronically Signed   By: Roanna Raider M.D.   On: 03/31/2014 06:45   Ct Head Wo Contrast  03/30/2014   CLINICAL DATA:  Code stroke. Left-sided facial droop and left arm weakness.  EXAM: CT HEAD WITHOUT CONTRAST  TECHNIQUE: Contiguous axial images were obtained from the base of the skull through the vertex without intravenous contrast.  COMPARISON:  None.  FINDINGS: There is no evidence of acute infarction, mass lesion, or intra- or extra-axial hemorrhage on CT.  Mild cerebellar atrophy is noted.  The brainstem and fourth ventricle are within normal limits. The third and lateral ventricles, and basal  ganglia are unremarkable in appearance. The cerebral hemispheres are symmetric in appearance, with normal gray-white differentiation. No mass effect or midline shift is seen.  There is no evidence of fracture; visualized osseous structures are unremarkable in appearance. The orbits are within normal limits. The paranasal sinuses and mastoid air cells are well-aerated. No significant soft tissue abnormalities are seen.  IMPRESSION: 1. No acute intracranial pathology seen on CT. 2. Mild cerebellar atrophy noted.  These results were called by telephone at the time of interpretation on 03/30/2014 at 10:05 pm to Dr. Margarita Grizzle, who verbally acknowledged these results.   Electronically Signed   By: Roanna Raider M.D.   On: 03/30/2014 22:07   Mr Brain Wo Contrast  03/31/2014   CLINICAL DATA:  Discharged today, status post cholecystitis. Worsening of peripheral neuropathy, unable to ambulate. Acute onset LEFT facial droop today at 6 p.m. by arm weakness.  EXAM: MRI HEAD WITHOUT CONTRAST  MRI CERVICAL SPINE WITHOUT CONTRAST  TECHNIQUE: Multiplanar, multiecho pulse sequences of the brain and surrounding structures, and cervical spine, to include the craniocervical junction and cervicothoracic junction, were obtained without intravenous contrast.  COMPARISON:  CT of the head March 30, 2014 at 2157 hr  FINDINGS: MRI HEAD FINDINGS  No reduced diffusion to suggest acute ischemia. No susceptibility artifact to suggest hemorrhage.  Ventricles and sulci are normal for patient's age. Minimal white matter changes suggest chronic small vessel ischemic disease, less than expected for age. No midline shift or mass effect.  No abnormal extra-axial fluid collections. Ocular globes and orbital contents are unremarkable though not tailored for evaluation. Paranasal sinuses the mastoid air cells are well aerated. No abnormal sellar expansion. No cerebellar tonsillar ectopia. No suspicious calvarial bone marrow signal.  MRI CERVICAL  SPINE FINDINGS  Moderately motion degraded examination. Vertebral bodies appear intact and aligned with straightened cervical lordosis. C6-7 interbody arthrodesis. Moderate to severe C3-4, C4-5 degenerative disc with compensatory chronic endplate changes. Severe C5-6 degenerative disc and endplate changes, appearing chronic. No STIR signal abnormality to suggest acute osseous process.  Motion limits assessment for subtle cord signal abnormality, no cervical spinal cord syrinx. Craniocervical junction appears intact. Included prevertebral and paraspinal soft tissues are unremarkable.  Level by level evaluation (moderate to severely motion degraded axial sequences limits evaluation):  C2-3: Moderate facet arthropathy without canal stenosis or neural foraminal narrowing.  C3-4: Annular bulging, uncovertebral hypertrophy and ileus mild facet arthropathy. Mild canal stenosis. Suspected at least mild to moderate moderate bilateral neural foraminal narrowing.  C4-5: Small broad-based disc bulge, uncovertebral hypertrophy and small LEFT central disc protrusion results in mild canal stenosis. Suspected at least moderate RIGHT neural foraminal narrowing.  C5-6: Approximate 3 mm LEFT central disc protrusion. Uncovertebral hypertrophy and mild facet arthropathy result in moderate to severe canal stenosis, AP dimension the canal is approximately 6 mm.  Suspected moderate bilateral neural foraminal narrowing.  C6-7: Moderate broad-based disc osteophyte complex, moderate canal stenosis. No definite neural foraminal narrowing.  C7-T1: Small broad-based disc bulge, uncovertebral hypertrophy without canal stenosis. Mild bilateral neural foraminal narrowing.  IMPRESSION: MRI HEAD: No acute intracranial process ; no acute ischemia. Normal noncontrast MRI of the brain for age.  MRI CERVICAL SPINE: Moderately motion degraded examination. No acute fracture nor malalignment.  C5-6 arthrodesis.  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  Neural foraminal narrowing C3-4 through C5-6, C7-T1: At least moderate on the RIGHT at C4-5 and bilaterally at C5-6.   Electronically Signed   By: Awilda Metro   On: 03/31/2014 01:17   Mr Cervical Spine Wo Contrast  03/31/2014   CLINICAL DATA:  Discharged today, status post cholecystitis. Worsening of peripheral neuropathy, unable to ambulate. Acute onset LEFT facial droop today at 6 p.m. by arm weakness.  EXAM: MRI HEAD WITHOUT CONTRAST  MRI CERVICAL SPINE WITHOUT CONTRAST  TECHNIQUE: Multiplanar, multiecho pulse sequences of the brain and surrounding structures, and cervical spine, to include the craniocervical junction and cervicothoracic junction, were obtained without intravenous contrast.  COMPARISON:  CT of the head March 30, 2014 at 2157 hr  FINDINGS: MRI HEAD FINDINGS  No reduced diffusion to suggest acute ischemia. No susceptibility artifact to suggest hemorrhage.  Ventricles and sulci are normal for patient's age. Minimal white matter changes suggest chronic small vessel ischemic disease, less than expected for age. No midline shift or mass effect.  No abnormal extra-axial fluid collections. Ocular globes and orbital contents are unremarkable though not tailored for evaluation. Paranasal sinuses the mastoid air cells are well aerated. No abnormal sellar expansion. No cerebellar tonsillar ectopia. No suspicious calvarial bone marrow signal.  MRI CERVICAL SPINE FINDINGS  Moderately motion degraded examination. Vertebral bodies appear intact and aligned with straightened cervical lordosis. C6-7 interbody arthrodesis. Moderate to severe C3-4, C4-5 degenerative disc with compensatory chronic endplate changes. Severe C5-6 degenerative disc and endplate changes, appearing chronic. No STIR signal abnormality to suggest acute osseous process.  Motion limits assessment for subtle cord signal abnormality, no  cervical spinal cord syrinx. Craniocervical junction appears intact. Included prevertebral and paraspinal soft tissues are unremarkable.  Level by level evaluation (moderate to severely motion degraded axial sequences limits evaluation):  C2-3: Moderate facet arthropathy without canal stenosis or neural foraminal narrowing.  C3-4: Annular bulging, uncovertebral hypertrophy and ileus mild facet arthropathy. Mild canal stenosis. Suspected at least mild to moderate moderate bilateral neural foraminal narrowing.  C4-5: Small broad-based disc bulge, uncovertebral hypertrophy and small LEFT central disc protrusion results in mild canal stenosis. Suspected at least moderate RIGHT neural foraminal narrowing.  C5-6: Approximate 3 mm LEFT central disc protrusion. Uncovertebral hypertrophy and mild facet arthropathy result in moderate to severe canal stenosis, AP dimension the canal is approximately 6 mm. Suspected moderate bilateral neural foraminal narrowing.  C6-7: Moderate broad-based disc osteophyte complex, moderate canal stenosis. No definite neural foraminal narrowing.  C7-T1: Small broad-based disc bulge, uncovertebral hypertrophy without canal stenosis. Mild bilateral neural foraminal narrowing.  IMPRESSION: MRI HEAD: No acute intracranial process ; no acute ischemia. Normal noncontrast MRI of the brain for age.  MRI CERVICAL SPINE: Moderately motion degraded examination. No acute fracture nor malalignment.  C5-6 arthrodesis.  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  Neural foraminal narrowing C3-4  through C5-6, C7-T1: At least moderate on the RIGHT at C4-5 and bilaterally at C5-6.   Electronically Signed   By: Awilda Metroourtnay  Bloomer   On: 03/31/2014 01:17   Nm Hepatobiliary Liver Func  03/31/2014   CLINICAL DATA:  Status post cholecystectomy 4 days ago, abdominal pain  EXAM: NUCLEAR MEDICINE HEPATOBILIARY IMAGING   TECHNIQUE: Sequential images of the abdomen were obtained out to 60 minutes following intravenous administration of radiopharmaceutical.  RADIOPHARMACEUTICALS:  [employer] Tc-682m Choletec  COMPARISON:  CT scan of the abdomen and pelvis of March 22, 2014, and supine abdominal film of today's date.  FINDINGS: There is adequate uptake of the radiopharmaceutical by the liver. Activity is visible in the common bile duct and bowel by 20 min. There is transient low-level activity over the inferior aspect of the right hepatic lobe on the 25 and 30 min images which is never again visualized out to 120 min. This is due to the patient intermittently moving her arm contained injection site over the field-of-view. There is no increasing accumulation of activity outside of bowel to suggest a bile leak.  IMPRESSION: There are no findings suspicious for a bile leak. There is no common bile duct obstruction.   Electronically Signed   By: David  [place]   On: 03/31/2014 15:49    Anti-infectives: Anti-infectives    Start     Dose/Rate Route Frequency Ordered Stop   04/01/14 2100  ciprofloxacin (CIPRO) tablet 500 mg     500 mg Oral Every 24 hours 04/01/14 0751 04/05/14 2059   03/31/14 2000  ciprofloxacin (CIPRO) tablet 500 mg  Status:  Discontinued     500 mg Oral 2 times daily 03/31/14 1624 04/01/14 0751       Assessment/Plan S/p Lap chole Dr. Derrell Lollingamirez 03/24/14 B/L LE weakness Left facial droop ARF Uncontrolled HTN Chronic DHF Hyperglycemia  Plan: 1.  Doing well from a surgical perspective.  HIDA negative for bile leak.  Her WBC and LFT's are normal this morning.  Will check a lipase just in case.  She has minimal pain like a band in her upper abdomen, but NT to palpation.  No N/V.  Tolerating some PO's well, but NPO for lumbar puncture.  If lipase is normal, we will sign off. 2.  Continue medical workup for weakness and facial droop    LOS: 2 days    DORT, Khalil Belote 04/01/2014, 11:09 AM Pager:  [phone]

## 2014-04-01 NOTE — Procedures (Signed)
Technically successful fluoro guided LBP at L5-S1 yielding 8 cc of clear colorless fluid. Opening pressure: 13 cm H20 Closing pressure: <10 cm H20 Samples sent to lab for analysis. No immediate post procedural complications.

## 2014-04-01 NOTE — Progress Notes (Signed)
[facility] PROGRESS NOTE  Dellia Donnelly ZOX:[id number] DOB: 14-May-1945 DOA: 03/30/2014 PCP: Amada Jupiter, PA-C  Assessment/Plan: 1. 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 MRI brain has been negative. Vitamin B-12 level borderline low at 298 .Marland Kitchen Further recommendations per neurologist. Patient started on IVIG 5 days, LP under fluoroscopy pending 2. Acute renal failure - closely follow metabolic panel. Improved after IV fluids. 3. Uncontrolled hypertension improving- continue home medications and will place patient on when necessary IV hydralazine. Closely follow blood pressure trends. 4. Chronic diastolic heart failure recent 2-D echo done last week showed EF of 55% with grade 1 diastolic dysfunction - presently appears compensated and no Lasix for now since patient's renal function is worsening. 5. Hyperglycemia with recently diagnosed diabetes mellitus type 2 hemoglobin A1c was 6.5 - patient was on multiple dose of steroids as per patient's daughter for gout. Continue SSI 6. Recent cholecystectomy - patient still has some abdominal discomfort radiating to the back. Closely observe. Will get a KUB. AST ALT has not been elevated. If pain persists may need repeat 7. 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 Poison control will make further recommendations after this level is obtained. Social worker notified  Code Status: full Family Communication: family updated about patient's clinical progress Disposition Plan:  As above    Brief narrative: Anna Klein is a 69 y.o. female who was recently admitted for abdominal pain and has had cholecystectomy and at that time patient was complaining of lower extremity cramping and numbness and had neurology workup done and was discharged to nursing home yesterday but patient's daughter noted that patient has  been having some left sided facial weakness. Initially patient's daughter noticed that patient was not able to close her left eyelid and when she reached the nursing home note is that patient was having left facial droop. Patient has some weakness in the right upper extremity which was noticed earlier. Patient was brought to the ER and CT head did not show anything acute and MRI brain was done which also did not show any stroke. At this time patient has been admitted for further management. Patient denies any chest pain or shortness of breath. Patient still has abdominal discomfort mainly in the upper back. Denies any nausea vomiting or diarrhea. Patient still has the numbness and tingling of the lower extremities up to the midcalf. Has poor deep tendon reflexes. Patient's daughter states that [place] when patient had some crabs and lobster following which patient started developing abdominal discomfort and tingling and numbness of the lower extremity and some choking episodes.  Consultants:  Neurology  General surgery  Procedures:  Lumbar puncture  Antibiotics: IVIG  HPI/Subjective: Improvement in her left lower extremity numbness and abdominal pain  Objective: Filed Vitals:   03/31/14 2135 04/01/14 0130 04/01/14 0555 04/01/14 1022  BP: 109/55 122/67 111/67 163/67  Pulse: 77 88 84 93  Temp: 97.8 F (36.6 C) 97.9 F (36.6 C) 97.5 F (36.4 C) 97.5 F (36.4 C)  TempSrc: Oral Oral Oral Oral  Resp: Height:      Weight:      SpO2: 98% 100% 100% 100%   No intake or output data in the 24 hours ending 04/01/14 1251  Exam:  General: alert & oriented x 3 In NAD  Cardiovascular: RRR, nl S1 s2  Respiratory: Decreased  breath sounds at the bases, scattered rhonchi, no crackles  Abdomen: soft +BS NT/ND, no masses palpable  Extremities: No cyanosis and no edema      Data Reviewed: Basic Metabolic Panel:  Recent Labs Lab  03/26/14 1320  03/29/14 1140 03/30/14 0845 03/30/14 2137 03/31/14 0538 04/01/14 0525  NA  --   < > 135 130* 133* 134* 131*  K  --   < > 3.1* 3.6 3.9 4.6 3.2*  CL  --   < > 94* 91* 94* 95* 98  CO2  --   < > GLUCOSE  --   < > 142* 127* 154* 164* 124*  BUN  --   < > 28* 40* 49* 46* 27*  CREATININE  --   < > 1.31* 1.45* 1.71* 1.62* 1.09  CALCIUM  --   < > 9.9 9.3 10.0 9.7 8.5  MG 1.8  --   --   --   --   --  2.1  < > = values in this interval not displayed.  Liver Function Tests:  Recent Labs Lab 03/30/14 2137 03/31/14 0538 04/01/14 0525  AST 34 42* 23  ALT 46* 40* 28  ALKPHOS 111 103 86  BILITOT 0.6 1.8* 0.8  PROT 7.3 6.7 6.4  ALBUMIN 4.1 3.9 3.0*   No results for input(s): LIPASE, AMYLASE in the last 168 hours. No results for input(s): AMMONIA in the last 168 hours.  CBC:  Recent Labs Lab 03/26/14 1159 03/27/14 0525 03/30/14 2137 03/31/14 1136 04/01/14 0525  WBC 9.4 7.7 14.4* 11.3* 6.3  NEUTROABS  --   --  10.2*  --   --   HGB 12.3 13.3 14.7 14.3 11.8*  HCT 37.9 40.7 43.0 42.7 35.2*  MCV 85.4 85.9 83.5 85.4 85.9  PLT 201 228 294 274 207    Cardiac Enzymes:  Recent Labs Lab 03/29/14 1140  CKTOTAL 36  CKMB 3.1   BNP (last 3 results) No results for input(s): PROBNP in the last 8760 hours.   CBG:  Recent Labs Lab 03/31/14 0655 03/31/14 1629 03/31/14 2143 04/01/14 0649 04/01/14 1201  GLUCAP 134* 179* 156* 130* 97    Recent Results (from the past 240 hour(s))  Urine culture     Status: None   Collection Time: 03/24/14  7:09 PM  Result Value Ref Range Status   Specimen Description URINE, CLEAN CATCH  Final   Special Requests Normal  Final   Colony Count   Final    3,000 COLONIES/ML Performed at [facility]    Culture   Final    INSIGNIFICANT GROWTH Performed at [facility]    Report Status 03/25/2014 FINAL  Final  Surgical pcr screen     Status: Abnormal   Collection Time: 03/25/14 11:19 AM  Result  Value Ref Range Status   MRSA, PCR NEGATIVE NEGATIVE Final   Staphylococcus aureus POSITIVE (A) NEGATIVE Final    Comment:        The Xpert SA Assay (FDA approved for NASAL specimens in patients over 28 years of age), is one component of a comprehensive surveillance program.  Test performance has been validated by [employer] for patients greater than or equal to 48 year old. It is not intended to diagnose infection nor to guide or monitor treatment.   Culture, Urine     Status: None (Preliminary result)   Collection Time: 03/30/14  2:32 PM  Result Value Ref Range Status  Specimen Description URINE, CLEAN CATCH  Final   Special Requests ADDED [id number]  Final   Colony Count   Final    >=100,000 COLONIES/ML Performed at [facility]    Culture   Final    ESCHERICHIA COLI Performed at [facility]    Report Status PENDING  Incomplete     Studies: Dg Chest 2 View  03/31/2014   CLINICAL DATA:  Weakness.  EXAM: CHEST  2 VIEW  COMPARISON:  03/24/2014  FINDINGS: Borderline hyperinflation and mild elevation of right hemidiaphragm. The heart size is normal, there is tortuosity of the thoracic aorta. Pulmonary vasculature is normal. Moderate hiatal hernia is again seen. There is no consolidation. No pleural effusion or pneumothorax. Compression deformity at the lower thoracic spine, unchanged.  IMPRESSION: No acute pulmonary process.   Electronically Signed   By: Rubye Oaks M.D.   On: 03/31/2014 01:44   Dg Chest 2 View  03/24/2014   CLINICAL DATA:  Chest pain for 3 days.  EXAM: CHEST  2 VIEW  COMPARISON:  CT scan of March 22, 2014.  FINDINGS: The heart size and mediastinal contours are within normal limits. Both lungs are clear. No pneumothorax or pleural effusion is noted. Compression deformity of lower thoracic vertebral body is noted consistent with old fracture.  IMPRESSION: No acute cardiopulmonary abnormality seen.   Electronically Signed   By: Roque Lias  M.D.   On: 03/24/2014 20:01   Dg Abd 1 View  03/31/2014   CLINICAL DATA:  Acute onset of generalized abdominal pain and back pain. Initial encounter.  EXAM: ABDOMEN - 1 VIEW  COMPARISON:  CT of the abdomen and pelvis from 03/22/2014  FINDINGS: The visualized bowel gas pattern is unremarkable. Scattered air and contrast filled loops of colon are seen; no abnormal dilatation of small bowel loops is seen to suggest small bowel obstruction. No free intra-abdominal air is identified, though evaluation for free air is limited on a single supine view.  There is mild rotatory scoliosis along the lumbar spine, with associated degenerative change. The sacroiliac joints are unremarkable.  IMPRESSION: 1. Unremarkable bowel gas pattern; no free intra-abdominal air seen. 2. 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   Electronically Signed   By: Roanna Raider M.D.   On: 03/31/2014 06:45   Dg Cholangiogram Operative  03/25/2014   CLINICAL DATA:  69 year old female with cholecystectomy.  EXAM: INTRAOPERATIVE CHOLANGIOGRAM  COMPARISON:  CT 03/22/2014  FINDINGS: Limited intraoperative fluoroscopic spot images of the upper abdomen during cholecystectomy.  Images demonstrate cannulation of the cystic duct at the level of surgical clips.  There is antegrade infusion through the cystic duct, with partial opacification of intrahepatic ducts at the liver hilum. Contrast fills the common hepatic duct and common bile duct, as well as partially within the pancreatic duct.  Contrast flows cross the ampulla into the duodenum.  There are a few is rounded mobile filling defects of the common hepatic duct.  No extraluminal contrast.  Surgical sponge projects over the upper abdomen. Retained contrast within the colon.  IMPRESSION: Intraoperative cholangiogram demonstrates cannulation of the cystic duct, and antegrade infusion of contrast into the common hepatic duct, common bile duct, and across the  ampulla. Contrast enters the duodenum, with unremarkable caliber of the extrahepatic ducts.  Mobile filling defects of the common hepatic duct may represent air bubbles or debris.  Surgical sponge projects over the midline.  Please refer to the dictated operative report for  full details of intraoperative findings and procedure.  Signed,  Yvone NeuJaime S. Loreta AveWagner, DO  [facility]  [facility]   Electronically Signed   By: Gilmer MorJaime  Wagner D.O.   On: 03/25/2014 15:35   Ct Head Wo Contrast  03/30/2014   CLINICAL DATA:  Code stroke. Left-sided facial droop and left arm weakness.  EXAM: CT HEAD WITHOUT CONTRAST  TECHNIQUE: Contiguous axial images were obtained from the base of the skull through the vertex without intravenous contrast.  COMPARISON:  None.  FINDINGS: There is no evidence of acute infarction, mass lesion, or intra- or extra-axial hemorrhage on CT.  Mild cerebellar atrophy is noted.  The brainstem and fourth ventricle are within normal limits. The third and lateral ventricles, and basal ganglia are unremarkable in appearance. The cerebral hemispheres are symmetric in appearance, with normal gray-white differentiation. No mass effect or midline shift is seen.  There is no evidence of fracture; visualized osseous structures are unremarkable in appearance. The orbits are within normal limits. The paranasal sinuses and mastoid air cells are well-aerated. No significant soft tissue abnormalities are seen.  IMPRESSION: 1. No acute intracranial pathology seen on CT. 2. Mild cerebellar atrophy noted.  These results were called by telephone at the time of interpretation on 03/30/2014 at 10:05 pm to Dr. Margarita Grizzleanielle Ray, who verbally acknowledged these results.   Electronically Signed   By: Roanna RaiderJeffery  Chang M.D.   On: 03/30/2014 22:07   Mr Brain Wo Contrast  03/31/2014   CLINICAL DATA:  Discharged today, status post cholecystitis. Worsening of peripheral neuropathy, unable to  ambulate. Acute onset LEFT facial droop today at 6 p.m. by arm weakness.  EXAM: MRI HEAD WITHOUT CONTRAST  MRI CERVICAL SPINE WITHOUT CONTRAST  TECHNIQUE: Multiplanar, multiecho pulse sequences of the brain and surrounding structures, and cervical spine, to include the craniocervical junction and cervicothoracic junction, were obtained without intravenous contrast.  COMPARISON:  CT of the head March 30, 2014 at 2157 hr  FINDINGS: MRI HEAD FINDINGS  No reduced diffusion to suggest acute ischemia. No susceptibility artifact to suggest hemorrhage.  Ventricles and sulci are normal for patient's age. Minimal white matter changes suggest chronic small vessel ischemic disease, less than expected for age. No midline shift or mass effect.  No abnormal extra-axial fluid collections. Ocular globes and orbital contents are unremarkable though not tailored for evaluation. Paranasal sinuses the mastoid air cells are well aerated. No abnormal sellar expansion. No cerebellar tonsillar ectopia. No suspicious calvarial bone marrow signal.  MRI CERVICAL SPINE FINDINGS  Moderately motion degraded examination. Vertebral bodies appear intact and aligned with straightened cervical lordosis. C6-7 interbody arthrodesis. Moderate to severe C3-4, C4-5 degenerative disc with compensatory chronic endplate changes. Severe C5-6 degenerative disc and endplate changes, appearing chronic. No STIR signal abnormality to suggest acute osseous process.  Motion limits assessment for subtle cord signal abnormality, no cervical spinal cord syrinx. Craniocervical junction appears intact. Included prevertebral and paraspinal soft tissues are unremarkable.  Level by level evaluation (moderate to severely motion degraded axial sequences limits evaluation):  C2-3: Moderate facet arthropathy without canal stenosis or neural foraminal narrowing.  C3-4: Annular bulging, uncovertebral hypertrophy and ileus mild facet arthropathy. Mild canal stenosis. Suspected at  least mild to moderate moderate bilateral neural foraminal narrowing.  C4-5: Small broad-based disc bulge, uncovertebral hypertrophy and small LEFT central disc protrusion results in mild canal stenosis. Suspected at least moderate RIGHT neural foraminal narrowing.  C5-6: Approximate 3 mm LEFT central disc protrusion. Uncovertebral hypertrophy and mild  facet arthropathy result in moderate to severe canal stenosis, AP dimension the canal is approximately 6 mm. Suspected moderate bilateral neural foraminal narrowing.  C6-7: Moderate broad-based disc osteophyte complex, moderate canal stenosis. No definite neural foraminal narrowing.  C7-T1: Small broad-based disc bulge, uncovertebral hypertrophy without canal stenosis. Mild bilateral neural foraminal narrowing.  IMPRESSION: MRI HEAD: No acute intracranial process ; no acute ischemia. Normal noncontrast MRI of the brain for age.  MRI CERVICAL SPINE: Moderately motion degraded examination. No acute fracture nor malalignment.  C5-6 arthrodesis.  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  Neural foraminal narrowing C3-4 through C5-6, C7-T1: At least moderate on the RIGHT at C4-5 and bilaterally at C5-6.   Electronically Signed   By: Awilda Metro   On: 03/31/2014 01:17   Mr Cervical Spine Wo Contrast  03/31/2014   CLINICAL DATA:  Discharged today, status post cholecystitis. Worsening of peripheral neuropathy, unable to ambulate. Acute onset LEFT facial droop today at 6 p.m. by arm weakness.  EXAM: MRI HEAD WITHOUT CONTRAST  MRI CERVICAL SPINE WITHOUT CONTRAST  TECHNIQUE: Multiplanar, multiecho pulse sequences of the brain and surrounding structures, and cervical spine, to include the craniocervical junction and cervicothoracic junction, were obtained without intravenous contrast.  COMPARISON:  CT of the head March 30, 2014 at 2157 hr  FINDINGS: MRI HEAD FINDINGS  No  reduced diffusion to suggest acute ischemia. No susceptibility artifact to suggest hemorrhage.  Ventricles and sulci are normal for patient's age. Minimal white matter changes suggest chronic small vessel ischemic disease, less than expected for age. No midline shift or mass effect.  No abnormal extra-axial fluid collections. Ocular globes and orbital contents are unremarkable though not tailored for evaluation. Paranasal sinuses the mastoid air cells are well aerated. No abnormal sellar expansion. No cerebellar tonsillar ectopia. No suspicious calvarial bone marrow signal.  MRI CERVICAL SPINE FINDINGS  Moderately motion degraded examination. Vertebral bodies appear intact and aligned with straightened cervical lordosis. C6-7 interbody arthrodesis. Moderate to severe C3-4, C4-5 degenerative disc with compensatory chronic endplate changes. Severe C5-6 degenerative disc and endplate changes, appearing chronic. No STIR signal abnormality to suggest acute osseous process.  Motion limits assessment for subtle cord signal abnormality, no cervical spinal cord syrinx. Craniocervical junction appears intact. Included prevertebral and paraspinal soft tissues are unremarkable.  Level by level evaluation (moderate to severely motion degraded axial sequences limits evaluation):  C2-3: Moderate facet arthropathy without canal stenosis or neural foraminal narrowing.  C3-4: Annular bulging, uncovertebral hypertrophy and ileus mild facet arthropathy. Mild canal stenosis. Suspected at least mild to moderate moderate bilateral neural foraminal narrowing.  C4-5: Small broad-based disc bulge, uncovertebral hypertrophy and small LEFT central disc protrusion results in mild canal stenosis. Suspected at least moderate RIGHT neural foraminal narrowing.  C5-6: Approximate 3 mm LEFT central disc protrusion. Uncovertebral hypertrophy and mild facet arthropathy result in moderate to severe canal stenosis, AP dimension the canal is approximately  6 mm. Suspected moderate bilateral neural foraminal narrowing.  C6-7: Moderate broad-based disc osteophyte complex, moderate canal stenosis. No definite neural foraminal narrowing.  C7-T1: Small broad-based disc bulge, uncovertebral hypertrophy without canal stenosis. Mild bilateral neural foraminal narrowing.  IMPRESSION: MRI HEAD: No acute intracranial process ; no acute ischemia. Normal noncontrast MRI of the brain for age.  MRI CERVICAL SPINE: Moderately motion degraded examination. No acute fracture nor malalignment.  C5-6 arthrodesis.  Degenerative change of the cervical spine results in apparent  moderate to severe canal stenosis at C5-6, moderate canal stenosis at C6-7.  Neural foraminal narrowing C3-4 through C5-6, C7-T1: At least moderate on the RIGHT at C4-5 and bilaterally at C5-6.   Electronically Signed   By: Awilda Metro   On: 03/31/2014 01:17   Mr Thoracic Spine Wo Contrast  03/28/2014   CLINICAL DATA:  Neuropathy. Fall. New lower extremity weakness and decreased sensation in the feet bilaterally.  EXAM: MRI THORACIC SPINE WITHOUT CONTRAST  TECHNIQUE: Multiplanar, multisequence MR imaging of the thoracic spine was performed. No intravenous contrast was administered.  COMPARISON:  Two-view chest x-ray 03/24/2014.  FINDINGS: A remote anterior fracture is present at L1. Schmorl's nodes are present at T12-L1 and L1-2. There is focal kyphosis at the at L1 level. Normal signal is present throughout the thoracic spinal cord. As rightward curvature of thoracic spine centered at T6.  No significant focal central canal stenosis is present. Small disc protrusions are present.  Mild right foraminal narrowing is present at T10-11 and T11-12 due to facet spurring at these levels. Mild left foraminal narrowing is present at T6-7. There is foraminal narrowing bilaterally at C7-T1 and T1-2, worse on the right.  Atherosclerotic changes are noted in the aorta without aneurysm. A large hiatal hernia is present.   IMPRESSION: 1. Exaggerated kyphosis centered at L1 were a remote fracture is evident. 2. Rightward curvature of the thoracic spine is centered at T6. 3. No significant central canal stenosis. 4. Mild foraminal disease as described.   Electronically Signed   By: Gennette Pac M.D.   On: 03/28/2014 15:56   Nm Hepatobiliary Liver Func  03/31/2014   CLINICAL DATA:  Status post cholecystectomy 4 days ago, abdominal pain  EXAM: NUCLEAR MEDICINE HEPATOBILIARY IMAGING  TECHNIQUE: Sequential images of the abdomen were obtained out to 60 minutes following intravenous administration of radiopharmaceutical.  RADIOPHARMACEUTICALS:  Five [employer] Tc-36m Choletec  COMPARISON:  CT scan of the abdomen and pelvis of March 22, 2014, and supine abdominal film of today's date.  FINDINGS: There is adequate uptake of the radiopharmaceutical by the liver. Activity is visible in the common bile duct and bowel by 20 min. There is transient low-level activity over the inferior aspect of the right hepatic lobe on the 25 and 30 min images which is never again visualized out to 120 min. This is due to the patient intermittently moving her arm contained injection site over the field-of-view. There is no increasing accumulation of activity outside of bowel to suggest a bile leak.  IMPRESSION: There are no findings suspicious for a bile leak. There is no common bile duct obstruction.   Electronically Signed   By: David  [place]   On: 03/31/2014 15:49   US Abdomen Complete  03/24/2014   CLINICAL DATA:  Chest pain.  Hypertension.  EXAM: ULTRASOUND ABDOMEN COMPLETE  COMPARISON:  CT abdomen and pelvis 03/22/2014.  FINDINGS: Gallbladder: Multiple stones in the dependent gallbladder, largest measuring 1.2 cm diameter. No gallbladder wall thickening or edema. Murphy's sign is negative.  Common bile duct: Diameter: 3.5 mm, normal  Liver: No focal lesion identified. Within normal limits in parenchymal echogenicity.  IVC: Not visualized due to  overlying bowel gas.  Pancreas: Not visualized due to overlying bowel gas.  Spleen: Size and appearance within normal limits.  Right Kidney: Length: 7.9 cm. Mild diffuse parenchymal thinning and increased echotexture suggesting medical renal disease. No solid mass or hydronephrosis.  Left Kidney: Length: 9.1 cm. Mild diffuse parenchymal thinning and increased echotexture  suggesting medical renal disease. No solid mass or hydronephrosis.  Abdominal aorta: Not visualized due to overlying bowel gas.  Other findings: None.  IMPRESSION: Cholelithiasis without additional changes of cholecystitis. Mild renal parenchymal changes suggesting chronic medical renal disease. No hydronephrosis.   Electronically Signed   By: Burman Nieves M.D.   On: 03/24/2014 22:03    Scheduled Meds: . aspirin  300 mg Rectal Daily   Or  . aspirin  325 mg Oral Daily  . ciprofloxacin  500 mg Oral Q24H  . docusate sodium  100 mg Oral BID  . feeding supplement (ENSURE COMPLETE)  237 mL Oral BID BM  . gabapentin  100 mg Oral BID  . hydrALAZINE  25 mg Oral 3 times per day  . Immune Globulin 5%  400 mg/kg Intravenous Q24 Hr x 5  . insulin aspart  0-9 Units Subcutaneous TID WC  . metoprolol  100 mg Oral BID  . polyethylene glycol  17 g Oral Daily   Continuous Infusions: . 0.9 % NaCl with KCl 40 mEq / L 75 mL/hr (04/01/14 0955)    Principal Problem:   Facial droop Active Problems:   DM type 2 (diabetes mellitus, type 2)   Chronic diastolic heart failure   Acute kidney injury    Time spent: 40 minutes   [facility]  [facility] Pager [phone]. If 7PM-7AM, please contact night-coverage at [email or web address], password [facility] 04/01/2014, 12:51 PM  LOS: 2 days

## 2014-04-01 NOTE — Progress Notes (Deleted)
Pt arrived to 4N25 via stretcher. Transferred to bed, A/O, VSS. Hemovac, and dressing intact. Pt c/o pain 4/10. Will continue to monitor. Latesa Fratto M, RN 

## 2014-04-01 NOTE — Evaluation (Signed)
Physical Therapy Evaluation Patient Details Name: Anna Klein MRN: [id number] DOB: 1945-04-12 Today's Date: 04/01/2014   History of Present Illness  Anna Klein is a 69 y.o. female who was recently admitted for abdominal pain and has had cholecystectomy and at that time patient was complaining of lower extremity cramping and numbness and had neurology workup done and was discharged to nursing home yesterday but patient's daughter noted that patient has been having some left sided facial weakness. Initially patient's daughter noticed that patient was not able to close her left eyelid and when she reached the nursing home note is that patient was having left facial droop. Patient has some weakness in the right upper extremity which was noticed earlier. Patient was brought to the ER and CT head did not show anything acute and MRI brain was done which also did not show any stroke.  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    Clinical Impression  Pt presents with above.  Continues to demonstrate decreased sensation in B feet (esp great toe) with decreased proprioception and ataxia.  She is to get another lumbar puncture today, however also noted that MD was in room and states that they found arsenic in her blood.  Pt will benefit from continued acute services to address deficits.  PT recommends return to SNF ([facility] declined due to insurance) for continued therapy at D/C.     Follow Up Recommendations SNF    Equipment Recommendations  Rolling walker with 5" wheels;3in1 (PT);Wheelchair (measurements PT);Wheelchair cushion (measurements PT)    Recommendations for Other Services       Precautions / Restrictions Precautions Precautions: Fall Precaution Comments: R heel wound, decreased sensation in B feet Restrictions Weight Bearing Restrictions: No      Mobility  Bed Mobility Overal bed mobility: Needs Assistance Bed Mobility: Supine to Sit;Sit to  Supine     Supine to sit: Min assist Sit to supine: Min assist   General bed mobility comments: Min A with HOB flat but with use of handrails.  Pt requires increased time and effort with assist for elevating trunk into sitting.   Transfers Overall transfer level: Needs assistance Equipment used: Rolling walker (2 wheeled) Transfers: Sit to/from Stand Sit to Stand: +2 safety/equipment;Mod assist Stand pivot transfers: +2 safety/equipment;Mod assist       General transfer comment: Pt with weak LEs and decreased coordination of LEs during standing initially and from toilet in restroom.  Requires mod cues for hand placement and safety.  Provided light assist at R knee to prevent buckle, however this may have been due to increased pain from wound on R heel.  Had +2 for safety and line management.   Ambulation/Gait Ambulation/Gait assistance: +2 safety/equipment;Mod assist Ambulation Distance (Feet): 15 Feet (x2 to/from restroom) Assistive device: Rolling walker (2 wheeled) Gait Pattern/deviations: Step-through pattern;Decreased stance time - right;Decreased stride length;Narrow base of support;Trunk flexed;Ataxic Gait velocity: Decreased   General Gait Details: Pt continues to demonstrate decreased proprioception and mild ataxia in LEs during gait to/from restroom.  She actually seemed to improve when ambulating back to bed from the restroom.  Max verbal cues and physical assist for guarding R knee for safety and maintaining posiition inside of RW.   Stairs            Wheelchair Mobility    Modified Rankin (Stroke Patients Only)       Balance Overall balance assessment: Needs assistance Sitting-balance support: Feet supported;Bilateral upper  extremity supported Sitting balance-Leahy Scale: Fair     Standing balance support: Bilateral upper extremity supported;During functional activity Standing balance-Leahy Scale: Poor Standing balance comment: Pt requires RW and mod A  for support when standing.                              Pertinent Vitals/Pain Pain Assessment: 0-10 Pain Score: 5  Pain Location: hip and back Pain Descriptors / Indicators: Aching Pain Intervention(s): Repositioned    Home Living Family/patient expects to be discharged to:: Skilled nursing facility                 Additional Comments: [facility] few days ago and prior to hospital admission was ambulatory without device but had been working with therapy in hospital [facility]  Was walking some with RW with therapy in hospital.     Prior Function Level of Independence: Independent         Comments: Patient was very active pta.  Had no trouble managing 11 stairs to get into home.  Drives.     Hand Dominance        Extremity/Trunk Assessment               Lower Extremity Assessment: Generalized weakness;RLE deficits/detail;LLE deficits/detail RLE Deficits / Details: Strength grossly 4-/5.  Decreased sensation in feet, mostly in great toe LLE Deficits / Details: Strength grossly 4-/5.  Decreased sensation in feet, esp at great toe  Cervical / Trunk Assessment: Normal  Communication   Communication: No difficulties  Cognition Arousal/Alertness: Awake/alert Behavior During Therapy: WFL for tasks assessed/performed Overall Cognitive Status: Within Functional Limits for tasks assessed                      General Comments      Exercises        Assessment/Plan    PT Assessment Patient needs continued PT services  PT Diagnosis Difficulty walking;Abnormality of gait;Generalized weakness;Acute pain   PT Problem List Decreased strength;Decreased activity tolerance;Decreased balance;Decreased mobility;Decreased coordination;Decreased knowledge of use of DME;Impaired sensation;Decreased knowledge of precautions;Pain  PT Treatment Interventions DME instruction;Gait  training;Functional mobility training;Therapeutic activities;Therapeutic exercise;Balance training;Patient/family education   PT Goals (Current goals can be found in the Care Plan section) Acute Rehab PT Goals Patient Stated Goal: To return to independence PT Goal Formulation: With patient/family Time For Goal Achievement: 04/15/14 Potential to Achieve Goals: Good    Frequency Min 4X/week   Barriers to discharge Decreased caregiver support;Inaccessible home environment      Co-evaluation               End of Session Equipment Utilized During Treatment: Gait belt Activity Tolerance: Patient limited by fatigue;Patient tolerated treatment well Patient left: in bed;with call bell/phone within reach (due to getting another lumbar puncture this morning) Nurse Communication: Mobility status         Time: [id number] PT Time Calculation (min) (ACUTE ONLY): 33 min   Charges:   PT Evaluation $Initial PT Evaluation Tier I: 1 Procedure PT Treatments $Gait Training: 8-22 mins $Therapeutic Activity: 8-22 mins   PT G Codes:        Vista Deckarcell, Anna Klein 04/01/2014, 11:02 AM

## 2014-04-01 NOTE — Progress Notes (Signed)
Critical lab received.  Arsenic Blood 66.  MD notified [phone].  Will continue to monitor. Sondra ComeSilva, Jodi Kappes M, RN

## 2014-04-01 NOTE — Clinical Social Work Note (Signed)
CSW reviewed chat once pt is medically stable she will transition back to Anna Klein. CSW will continue to assist and follow.    Jkwon Treptow, MSW, LCSWA [phone] 

## 2014-04-02 DIAGNOSIS — G610 Guillain-Barre syndrome: Secondary | ICD-10-CM | POA: Insufficient documentation

## 2014-04-02 DIAGNOSIS — R531 Weakness: Secondary | ICD-10-CM | POA: Insufficient documentation

## 2014-04-02 LAB — GLUCOSE, CAPILLARY
Glucose-Capillary: 111 mg/dL — ABNORMAL HIGH (ref 70–99)
Glucose-Capillary: 140 mg/dL — ABNORMAL HIGH (ref 70–99)
Glucose-Capillary: 83 mg/dL (ref 70–99)
Glucose-Capillary: 131 mg/dL — ABNORMAL HIGH (ref 70–99)

## 2014-04-02 LAB — COMPREHENSIVE METABOLIC PANEL
ALK PHOS: 80 U/L (ref 39–117)
ALT: 21 U/L (ref 0–35)
AST: 21 U/L (ref 0–37)
Albumin: 2.8 g/dL — ABNORMAL LOW (ref 3.5–5.2)
Anion gap: 3 — ABNORMAL LOW (ref 5–15)
BILIRUBIN TOTAL: 0.4 mg/dL (ref 0.3–1.2)
BUN: 16 mg/dL (ref 6–23)
CALCIUM: 8.8 mg/dL (ref 8.4–10.5)
CO2: 27 mmol/L (ref 19–32)
Chloride: 105 mEq/L (ref 96–112)
Creatinine, Ser: 0.95 mg/dL (ref 0.50–1.10)
GFR calc Af Amer: 69 mL/min — ABNORMAL LOW (ref >90)
GFR, EST NON AFRICAN AMERICAN: 59 mL/min — AB (ref >90)
Glucose, Bld: 116 mg/dL — ABNORMAL HIGH (ref 70–99)
Potassium: 3.6 mmol/L (ref 3.5–5.1)
Sodium: 135 mmol/L (ref 135–145)
Total Protein: 6.3 g/dL (ref 6.0–8.3)

## 2014-04-02 LAB — METHYLMALONIC ACID, SERUM: Methylmalonic Acid, Quantitative: 824 nmol/L — ABNORMAL HIGH (ref 87–318)

## 2014-04-02 MED ORDER — POTASSIUM CHLORIDE IN NACL 20-0.9 MEQ/L-% IV SOLN
INTRAVENOUS | Status: DC
  Administered 2014-04-02: 10:00:00 via INTRAVENOUS
  Filled 2014-04-02 (×2): qty 1000

## 2014-04-02 NOTE — Evaluation (Signed)
Clinical/Bedside Swallow Evaluation Patient Details  Name: Anna Klein MRN: [id number] Date of Birth: [birth year]  Today's Date: 04/02/2014 Time: 7829-5621 SLP Time Calculation (min) (ACUTE ONLY): 14 min  Past Medical History:  Past Medical History  Diagnosis Date  . Hypertension   . DM (diabetes mellitus)    Past Surgical History:  Past Surgical History  Procedure Laterality Date  . Cholecystectomy N/A 03/25/2014    Procedure: LAPAROSCOPIC CHOLECYSTECTOMY WITH INTRAOPERATIVE CHOLANGIOGRAM;  Surgeon: Axel Filler, MD;  Location: [facility] OR;  Service: General;  Laterality: N/A;   HPI:  69 y.o. Female with history of diabetes and hypertension who was recently admitted for abdominal pain and has had cholecystectomy and at that time patient was complaining of lower extremity cramping and numbness and had neurology workup done and was discharged to nursing home yesterday but patient's daughter noted that patient has been having some left sided facial weakness. MRI negative for acute infarct. Differential includes arsenic poisening given elevated levels versus GBS.   Assessment / Plan / Recommendation Clinical Impression  Pt has left-sided weakness that leads to left buccal pocketing, for which pt compensates with a lingual sweep with Mod I. No overt signs of aspiration observed. Pt is safe to continue with regular textures and thin liquids. Discussed with pt and daughter that f/u SLP services may be an option upon return to SNF if facial droop persists. No acute needs identified at this time, SLP to sign off.    Aspiration Risk  Mild    Diet Recommendation Regular;Thin liquid   Liquid Administration via: Cup;Straw Medication Administration: Whole meds with liquid Supervision: Patient able to self feed;Intermittent supervision to cue for compensatory strategies Compensations: Slow rate;Small sips/bites;Check for pocketing Postural Changes and/or Swallow Maneuvers: Seated upright 90 degrees     Other  Recommendations Oral Care Recommendations: Oral care BID   Follow Up Recommendations  Skilled Nursing facility (if facial droop persists)    Frequency and Duration        Pertinent Vitals/Pain n/a    SLP Swallow Goals     Swallow Study Prior Functional Status       General HPI: 69 y.o. Female with history of diabetes and hypertension who was recently admitted for abdominal pain and has had cholecystectomy and at that time patient was complaining of lower extremity cramping and numbness and had neurology workup done and was discharged to nursing home yesterday but patient's daughter noted that patient has been having some left sided facial weakness. MRI negative for acute infarct. Differential includes arsenic poisening given elevated levels versus GBS. Type of Study: Bedside swallow evaluation Previous Swallow Assessment: none in chart Diet Prior to this Study: Regular;Thin liquids Temperature Spikes Noted: No Respiratory Status: Room air History of Recent Intubation: No Behavior/Cognition: Alert;Cooperative;Pleasant mood Oral Cavity - Dentition: Adequate natural dentition Self-Feeding Abilities: Able to feed self Patient Positioning: Upright in bed Baseline Vocal Quality: Clear    Oral/Motor/Sensory Function Overall Oral Motor/Sensory Function: Impaired Labial ROM: Reduced left Labial Symmetry: Abnormal symmetry left Labial Strength: Reduced Labial Sensation: Within Functional Limits Lingual ROM: Within Functional Limits Lingual Symmetry: Within Functional Limits Lingual Strength: Within Functional Limits Lingual Sensation: Within Functional Limits Facial ROM: Reduced left Facial Symmetry: Left droop Facial Strength: Reduced Facial Sensation: Within Functional Limits Mandible: Within Functional Limits   Ice Chips Ice chips: Not tested   Thin Liquid Thin Liquid: Within functional limits Presentation: Self Fed;Straw    Nectar Thick Nectar Thick Liquid: Not  tested   Honey Thick Honey  Thick Liquid: Not tested   Puree Puree: Within functional limits Presentation: Self Fed;Spoon   Solid   GO    Solid: Impaired Presentation: Self Fed Oral Phase Functional Implications: Left lateral sulci pocketing       Maxcine HamLaura Paiewonsky, M.A. CCC-SLP [phone]  Maxcine Hamaiewonsky, Chauntel Windsor 04/02/2014,12:48 PM

## 2014-04-02 NOTE — Progress Notes (Signed)
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 Oral Q24H  . docusate sodium  100 mg Oral BID  . feeding supplement (GLUCERNA SHAKE)  237 mL Oral BID BM  . gabapentin  100 mg Oral BID  . hydrALAZINE  25 mg Oral 3 times per day  . Immune Globulin 5%  400 mg/kg Intravenous Q24 Hr x 5  . insulin aspart  0-9 Units Subcutaneous TID WC  . metoprolol  100 mg Oral BID  . polyethylene glycol  17 g Oral Daily   Continuous Infusions: . 0.9 % NaCl with KCl 20 mEq / L 75 mL/hr at 04/02/14 [id number]    Principal Problem:   Facial droop Active Problems:   DM type 2 (diabetes mellitus, type 2)   Chronic diastolic heart failure   Acute kidney injury   GBS (Guillain-Barre syndrome)   Weakness    Time spent: 40 minutes   [facility] CenterBROL,Maitland Muhlbauer  [facility] Pager [phone]. If 7PM-7AM,  please contact night-coverage at [email or web address], password [facility] 04/02/2014, 11:27 AM  LOS: 3 days

## 2014-04-02 NOTE — Progress Notes (Signed)
Subjective: Patient had no new complaints. Numbness and paresthesias involving extremities are unchanged.  Objective: Current vital signs: BP 166/80 mmHg  Pulse 67  Temp(Src) 97.8 F (36.6 C) (Oral)  Resp 20  Ht 5\' 1"  (1.549 m)  Wt 63.685 kg (140 lb 6.4 oz)  BMI 26.54 kg/m2  SpO2 99%  Neurologic Exam: Patient was alert and in no acute distress. Mental status was normal. Extraocular movements were full and conjugate. She had no double vision in any field of gaze. Left facial weakness was moderate, with greater involving lower face and upper face. Speech was minimally dysarthric, commensurate with facial weakness. Strength of upper extremities was normal except for mild wrist extensor weakness bilaterally and mild to moderate bilateral intrinsic hand muscle weakness. Lower extremity exam showed moderate weakness proximally as well as mild to moderate weakness distally, left greater than right. Deep tendon reflexes were absent throughout.  Medications: I have reviewed the patient's current medications.  Assessment/Plan: 69 year old lady with acute neuropathy involving bilateral lower extremities and distal upper extremities as well as left face. 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 Significance of elevated arsenic level is unclear but may be a contributing factor in patient's acute neuropathy. 24 hour urine arsenic measurement is being collected. No interventional treatment been undertaken yet, and is pending are the laboratory results. Interventional physical therapy is to continue, as well as continuation of twice a day. This is day 3 of 5 days of planned IVIG treatment.   No change in current management is recommended. Patient will need to return to rehabilitation following completion of IVIG treatments. Ex  We will continue to follow this patient closely with you.  C.R. Roseanne RenoStewart, MD [facility] [phone]  04/02/2014  9:18 AM

## 2014-04-03 DIAGNOSIS — Z1612 Extended spectrum beta lactamase (ESBL) resistance: Secondary | ICD-10-CM

## 2014-04-03 DIAGNOSIS — A499 Bacterial infection, unspecified: Secondary | ICD-10-CM

## 2014-04-03 DIAGNOSIS — G610 Guillain-Barre syndrome: Secondary | ICD-10-CM

## 2014-04-03 DIAGNOSIS — E538 Deficiency of other specified B group vitamins: Secondary | ICD-10-CM | POA: Insufficient documentation

## 2014-04-03 LAB — COMPREHENSIVE METABOLIC PANEL
ALBUMIN: 2.6 g/dL — AB (ref 3.5–5.2)
ALK PHOS: 75 U/L (ref 39–117)
ALT: 21 U/L (ref 0–35)
ANION GAP: 2 — AB (ref 5–15)
AST: 19 U/L (ref 0–37)
BUN: 12 mg/dL (ref 6–23)
CALCIUM: 8.5 mg/dL (ref 8.4–10.5)
CO2: 27 mmol/L (ref 19–32)
Chloride: 103 mEq/L (ref 96–112)
Creatinine, Ser: 0.85 mg/dL (ref 0.50–1.10)
GFR calc Af Amer: 79 mL/min — ABNORMAL LOW (ref >90)
GFR calc non Af Amer: 68 mL/min — ABNORMAL LOW (ref >90)
GLUCOSE: 126 mg/dL — AB (ref 70–99)
POTASSIUM: 3.4 mmol/L — AB (ref 3.5–5.1)
Sodium: 132 mmol/L — ABNORMAL LOW (ref 135–145)
Total Bilirubin: 0.6 mg/dL (ref 0.3–1.2)
Total Protein: 6.8 g/dL (ref 6.0–8.3)

## 2014-04-03 LAB — GLUCOSE, CAPILLARY
Glucose-Capillary: 104 mg/dL — ABNORMAL HIGH (ref 70–99)
Glucose-Capillary: 151 mg/dL — ABNORMAL HIGH (ref 70–99)
Glucose-Capillary: 152 mg/dL — ABNORMAL HIGH (ref 70–99)
Glucose-Capillary: 189 mg/dL — ABNORMAL HIGH (ref 70–99)

## 2014-04-03 MED ORDER — SENNOSIDES-DOCUSATE SODIUM 8.6-50 MG PO TABS
1.0000 | ORAL_TABLET | Freq: Two times a day (BID) | ORAL | Status: DC
  Administered 2014-04-03 – 2014-04-05 (×4): 1 via ORAL
  Filled 2014-04-03 (×4): qty 1

## 2014-04-03 MED ORDER — POTASSIUM CHLORIDE CRYS ER 20 MEQ PO TBCR
40.0000 meq | EXTENDED_RELEASE_TABLET | Freq: Once | ORAL | Status: AC
  Administered 2014-04-03: 40 meq via ORAL
  Filled 2014-04-03: qty 2

## 2014-04-03 MED ORDER — GLYCERIN (LAXATIVE) 2.1 G RE SUPP
1.0000 | Freq: Every day | RECTAL | Status: DC | PRN
  Filled 2014-04-03: qty 1

## 2014-04-03 MED ORDER — SODIUM CHLORIDE 0.9 % IV SOLN
500.0000 mg | Freq: Three times a day (TID) | INTRAVENOUS | Status: DC
  Administered 2014-04-03: 500 mg via INTRAVENOUS
  Filled 2014-04-03 (×2): qty 500

## 2014-04-03 MED ORDER — POLYETHYLENE GLYCOL 3350 17 G PO PACK
17.0000 g | PACK | Freq: Every day | ORAL | Status: DC
  Administered 2014-04-03 – 2014-04-05 (×3): 17 g via ORAL
  Filled 2014-04-03 (×3): qty 1

## 2014-04-03 MED ORDER — SODIUM CHLORIDE 0.9 % IV SOLN
250.0000 mg | Freq: Four times a day (QID) | INTRAVENOUS | Status: DC
  Administered 2014-04-03 – 2014-04-05 (×7): 250 mg via INTRAVENOUS
  Filled 2014-04-03 (×12): qty 250

## 2014-04-03 MED ORDER — IMMUNE GLOBULIN (HUMAN) 5 GM/100ML IV SOLN
400.0000 mg/kg | INTRAVENOUS | Status: AC
  Administered 2014-04-03: 25 g via INTRAVENOUS
  Filled 2014-04-03: qty 100

## 2014-04-03 MED ORDER — CYANOCOBALAMIN 1000 MCG/ML IJ SOLN
1000.0000 ug | Freq: Every day | INTRAMUSCULAR | Status: AC
  Administered 2014-04-03 – 2014-04-05 (×3): 1000 ug via INTRAMUSCULAR
  Filled 2014-04-03 (×4): qty 1
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
## 2014-04-03 NOTE — Progress Notes (Signed)
Subjective: Patient had no new complaints. She is not experiencing any clear change in paresthesias and numbness involving the lower extremities in her hands. Left facial weakness is unchanged.  Objective: Current vital signs: BP 126/63 mmHg  Pulse 68  Temp(Src) 98.1 F (36.7 C) (Oral)  Resp 16  Ht 5\' 1"  (1.549 m)  Wt 63.685 kg (140 lb 6.4 oz)  BMI 26.54 kg/m2  SpO2 100%  Neurologic Exam: Alert and in no acute distress. Mental status was normal. Extraocular movements were full and conjugate.  Left facial weakness was moderate, with no change from yesterday. Speech was minimally dysarthric, commensurate with facial weakness. Strength of upper extremities was normal except for mild wrist extensor weakness bilaterally and mild to moderate bilateral intrinsic hand muscle weakness. Lower extremity exam showed moderate weakness proximally as well as mild to moderate weakness distally, left greater than right. Deep tendon reflexes were absent throughout.  24 hour urine collection for arsenic was completed and submitted to the lab. Results are pending.   Medications: I have reviewed the patient's current medications.  Assessment/Plan: 69 year old lady with apical and septal findings consistent with GBS. 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 24-hour urine arsenic results pending. This is day 4 of 5 days of IVIG treatment. No clear clinical improvement has been seen so far.  No changes in current management anticipated. Management of elevated arsenic level will depend on pending urine study. Patient will likely need hair and nail evaluation. She will also need inpatient rehabilitation at the end of her acute hospital stay.  C.R. Roseanne RenoStewart, MD [facility] [phone]  04/03/2014  9:08 AM

## 2014-04-04 LAB — COMPREHENSIVE METABOLIC PANEL
ALBUMIN: 2.6 g/dL — AB (ref 3.5–5.2)
ALT: 21 U/L (ref 0–35)
AST: 21 U/L (ref 0–37)
Alkaline Phosphatase: 74 U/L (ref 39–117)
Anion gap: 4 — ABNORMAL LOW (ref 5–15)
BUN: 11 mg/dL (ref 6–23)
CO2: 25 mmol/L (ref 19–32)
CREATININE: 0.94 mg/dL (ref 0.50–1.10)
Calcium: 8.9 mg/dL (ref 8.4–10.5)
Chloride: 103 mEq/L (ref 96–112)
GFR calc non Af Amer: 60 mL/min — ABNORMAL LOW (ref >90)
GFR, EST AFRICAN AMERICAN: 70 mL/min — AB (ref >90)
Glucose, Bld: 114 mg/dL — ABNORMAL HIGH (ref 70–99)
POTASSIUM: 3.4 mmol/L — AB (ref 3.5–5.1)
SODIUM: 132 mmol/L — AB (ref 135–145)
TOTAL PROTEIN: 7.3 g/dL (ref 6.0–8.3)
Total Bilirubin: 0.6 mg/dL (ref 0.3–1.2)

## 2014-04-04 LAB — GLUCOSE, CAPILLARY
Glucose-Capillary: 102 mg/dL — ABNORMAL HIGH (ref 70–99)
Glucose-Capillary: 146 mg/dL — ABNORMAL HIGH (ref 70–99)
Glucose-Capillary: 105 mg/dL — ABNORMAL HIGH (ref 70–99)
Glucose-Capillary: 119 mg/dL — ABNORMAL HIGH (ref 70–99)

## 2014-04-04 LAB — CSF IGG: IgG, CSF: 19.4 mg/dL — ABNORMAL HIGH (ref 0.8–7.7)

## 2014-04-04 LAB — CBC
HCT: 34.7 % — ABNORMAL LOW (ref 36.0–46.0)
Hemoglobin: 11.6 g/dL — ABNORMAL LOW (ref 12.0–15.0)
MCH: 28.4 pg (ref 26.0–34.0)
MCHC: 33.4 g/dL (ref 30.0–36.0)
MCV: 84.8 fL (ref 78.0–100.0)
PLATELETS: 206 10*3/uL (ref 150–400)
RBC: 4.09 MIL/uL (ref 3.87–5.11)
RDW: 14.8 % (ref 11.5–15.5)
WBC: 5.6 10*3/uL (ref 4.0–10.5)

## 2014-04-04 MED ORDER — POTASSIUM CHLORIDE CRYS ER 20 MEQ PO TBCR
40.0000 meq | EXTENDED_RELEASE_TABLET | Freq: Once | ORAL | Status: AC
  Administered 2014-04-04: 40 meq via ORAL
  Filled 2014-04-04: qty 2

## 2014-04-04 MED ORDER — FLEET ENEMA 7-19 GM/118ML RE ENEM
1.0000 | ENEMA | Freq: Once | RECTAL | Status: AC
  Administered 2014-04-04: 1 via RECTAL
  Filled 2014-04-04: qty 1

## 2014-04-04 MED ORDER — IMMUNE GLOBULIN (HUMAN) 5 GM/100ML IV SOLN
400.0000 mg/kg | INTRAVENOUS | Status: AC
  Administered 2014-04-04: 25 g via INTRAVENOUS
  Filled 2014-04-04: qty 100

## 2014-04-04 NOTE — Clinical Social Work Note (Signed)
CSW received chart. Once pt is medically stable she can transition back to [employer] CorporationShannon Klein. CSW will continue to follow and assist. [name] consult was addressed on 04/01/2014 at 10:28am.   Larisha Vencill, MSW, LCSWA [phone]

## 2014-04-04 NOTE — Progress Notes (Signed)
Physical Therapy Treatment Patient Details Name: Anna Klein MRN: [id number] DOB: Nov 05, 1945 Today's Date: 04/04/2014    History of Present Illness Anna Klein is a 69 y.o. female who was recently admitted for abdominal pain and has had cholecystectomy and at that time patient was complaining of lower extremity cramping and numbness and had neurology workup done and was discharged to nursing home yesterday but patient's daughter noted that patient has been having some left sided facial weakness. Initially patient's daughter noticed that patient was not able to close her left eyelid and when she reached the nursing home note is that patient was having left facial droop. Patient has some weakness in the right upper extremity which was noticed earlier. Patient was brought to the ER and CT head did not show anything acute and MRI brain was done which also did not show any stroke.        PT Comments    Pt was very compliant and daughter was involved in treatment as well asking what exercises she could be doing. Demonstrated exercises for her while in room. Pt. Would benefit from more gait training and strengthening.  Follow Up Recommendations  SNF     Equipment Recommendations  Rolling walker with 5" wheels;3in1 (PT);Wheelchair (measurements PT);Wheelchair cushion (measurements PT)    Recommendations for Other Services Rehab consult     Precautions / Restrictions Precautions Precautions: Fall Precaution Comments: Rt heel sore spot and decreased sensation in Rt LE Restrictions Weight Bearing Restrictions: No    Mobility  Bed Mobility Overal bed mobility: Needs Assistance Bed Mobility: Supine to Sit;Sit to Supine     Supine to sit: Modified independent (Device/Increase time) Sit to supine: Modified independent (Device/Increase time)   General bed mobility comments: Increased time with use of hand rails with HOB at about 35 degrees to elevate trunk into sitting  Transfers Overall  transfer level: Needs assistance Equipment used: Rolling walker (2 wheeled) Transfers: Sit to/from Stand Sit to Stand: Min guard;+2 safety/equipment Stand pivot transfers: +2 safety/equipment;Min guard       General transfer comment: Pt. with weak LEs particularly the R. Can verbalize and demonstrate handplacement on bed and gave her cues for hand placement on her ant. thigh for approximation. No buckle at right knee during transfer. Haad +2 for safety and line management.  Ambulation/Gait Ambulation/Gait assistance: +2 safety/equipment;Min assist Ambulation Distance (Feet): 40 Feet Assistive device: Rolling walker (2 wheeled) Gait Pattern/deviations: Step-through pattern;Decreased stance time - right;Decreased stride length;Narrow base of support;Trunk flexed;Ataxic Gait velocity: Decreased Gait velocity interpretation: Below normal speed for age/gender General Gait Details: Pt. continues to demonstrate decreased propioception and mild ataxia during gait. close follow with recliner for safety and need for a rest break.   Stairs            Wheelchair Mobility    Modified Rankin (Stroke Patients Only)       Balance Overall balance assessment: Needs assistance Sitting-balance support: Feet supported;Bilateral upper extremity supported Sitting balance-Leahy Scale: Fair     Standing balance support: Bilateral upper extremity supported;During functional activity Standing balance-Leahy Scale: Poor Standing balance comment: Pt. requires RW and min g for support when standing. Able to handle perturbations and maintain balance without LOB. When Weight shifting toward right slight shake                    Cognition Arousal/Alertness: Awake/alert Behavior During Therapy: WFL for tasks assessed/performed Overall Cognitive Status: Within Functional Limits for tasks assessed  Exercises General Exercises - Lower Extremity Quad Sets:  AROM;Supine;Both;5 reps Long Arc Quad: AROM;Seated;Both;5 reps Heel Slides: AROM;Supine;10 reps Hip ABduction/ADduction: AROM;Supine;Both;5 reps Straight Leg Raises: AROM;Supine;Both;5 reps    General Comments        Pertinent Vitals/Pain Pain Assessment: No/denies pain    Home Living                      Prior Function            PT Goals (current goals can now be found in the care plan section) Progress towards PT goals: Progressing toward goals    Frequency  Min 4X/week    PT Plan Current plan remains appropriate    Co-evaluation             End of Session Equipment Utilized During Treatment: Gait belt Activity Tolerance: Patient tolerated treatment well Patient left: with call bell/phone within reach;with family/visitor present;in chair;with chair alarm set     Time: 1320-1355 PT Time Calculation (min) (ACUTE ONLY): 35 min  Charges:                       G Codes:      Perlie MayoWilson, Karlita Lichtman, SPTA 04/04/2014, 3:45 PM
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
## 2014-04-04 NOTE — Progress Notes (Signed)
UR COMPLETED  

## 2014-04-04 NOTE — Progress Notes (Signed)
Subjective: Patient reports that she has noticed some improvement in her numbness.  Continues on IVIg, being s/p 4 treatments at this time.    Objective: Current vital signs: BP 147/64 mmHg  Pulse 73  Temp(Src) 97.2 F (36.2 C) (Oral)  Resp 18  Ht  (1.549 m)  Wt 63.685 kg (140 lb 6.4 oz)  BMI 26.54 kg/m2  SpO2 99% Vital signs in last 24 hours: Temp:  [97.2 F (36.2 C)-98.4 F (36.9 C)] 97.2 F (36.2 C) (01/18 0618) Pulse Rate:  [63-79] 73 (01/18 0618) Resp:  [18-20] 18 (01/18 0618) BP: (100-154)/(49-81) 147/64 mmHg (01/18 0618) SpO2:  [95 %-100 %] 99 % (01/18 0618)  Intake/Output from previous day: 01/17 0701 - 01/18 0700 In: 480 [P.O.:480] Out: -  Intake/Output this shift:   Nutritional status: Diet Carb Modified  Neurologic Exam: Mental Status: Alert, oriented, thought content appropriate.  Speech fluent without evidence of aphasia.  Able to follow 3 step commands without difficulty. Cranial Nerves: II: Discs flat bilaterally; Visual fields grossly normal, pupils equal, round, reactive to light and accommodation III,IV, VI: ptosis not present, extra-ocular motions intact bilaterally V,VII: left facial droop with incomplete left eye closure, facial light touch sensation normal bilaterally VIII: hearing normal bilaterally IX,X: gag reflex present XI: bilateral shoulder shrug XII: midline tongue extension Motor: 5-/5 right deltoid and right hand grip.  5/5 on the left.  5/5 proximally in the lower extremities with 4/5 right plantar flexion and 3+/5 left plantar flexion.   Sensory: Pinprick and light touch intact decreased in the hands bilaterally to the wrists and in the feet bilaterally to just above the ankles.   Deep Tendon Reflexes: 1+ in the upper extremities and absent in the lower extremities.   Plantars: Right: mute   Left: mute Cerebellar: normal finger-to-nose testing bilaterally, mild dysmetria with heel-to-shin testing, L>R   Lab Results: Basic  Metabolic Panel:  Recent Labs Lab 03/31/14 0538 04/01/14 0525 04/02/14 1500 04/03/14 0500 04/04/14 0638  NA 134* 131* 135 132* 132*  K 4.6 3.2* 3.6 3.4* 3.4*  CL 95* 98 105 103 103  CO2 GLUCOSE 164* 124* 116* 126* 114*  BUN 46* 27* CREATININE 1.62* 1.09 0.95 0.85 0.94  CALCIUM 9.7 8.5 8.8 8.5 8.9  MG  --  2.1  --   --   --     Liver Function Tests:  Recent Labs Lab 03/31/14 0538 04/01/14 0525 04/02/14 1500 04/03/14 0500 04/04/14 0638  AST 42* ALT 40* ALKPHOS 103 86 80 75 74  BILITOT 1.8* 0.8 0.4 0.6 0.6  PROT 6.7 6.4 6.3 6.8 7.3  ALBUMIN 3.9 3.0* 2.8* 2.6* 2.6*    Recent Labs Lab 04/01/14 0830  LIPASE 57   No results for input(s): AMMONIA in the last 168 hours.  CBC:  Recent Labs Lab 03/30/14 2137 03/31/14 1136 04/01/14 0525 04/04/14 0638  WBC 14.4* 11.3* 6.3 5.6  NEUTROABS 10.2*  --   --   --   HGB 14.7 14.3 11.8* 11.6*  HCT 43.0 42.7 35.2* 34.7*  MCV 83.5 85.4 85.9 84.8  PLT 294 274 207 206    Cardiac Enzymes:  Recent Labs Lab 03/29/14 1140  CKTOTAL 36  CKMB 3.1    Lipid Panel: No results for input(s): CHOL, TRIG, HDL, CHOLHDL, VLDL, LDLCALC in the last 168 hours.  CBG:  Recent Labs Lab 04/03/14 0656 04/03/14 1103 04/03/14  1653 04/03/14 2149 04/04/14 0641  GLUCAP 104* 151* 152* 189* 102*    Microbiology: Results for orders placed or performed during the hospital encounter of 03/24/14  Urine culture     Status: None   Collection Time: 03/24/14  7:09 PM  Result Value Ref Range Status   Specimen Description URINE, CLEAN CATCH  Final   Special Requests Normal  Final   Colony Count   Final    3,000 COLONIES/ML Performed at [facility]    Culture   Final    INSIGNIFICANT GROWTH Performed at [facility]    Report Status 03/25/2014 FINAL  Final  Surgical pcr screen     Status: Abnormal   Collection Time: 03/25/14 11:19 AM  Result Value Ref Range Status    MRSA, PCR NEGATIVE NEGATIVE Final   Staphylococcus aureus POSITIVE (A) NEGATIVE Final    Comment:        The Xpert SA Assay (FDA approved for NASAL specimens in patients over 31 years of age), is one component of a comprehensive surveillance program.  Test performance has been validated by [employer] for patients greater than or equal to 50 year old. It is not intended to diagnose infection nor to guide or monitor treatment.   Culture, Urine     Status: None   Collection Time: 03/30/14  2:32 PM  Result Value Ref Range Status   Specimen Description URINE, CLEAN CATCH  Final   Special Requests ADDED [id number]  Final   Colony Count   Final    >=100,000 COLONIES/ML Performed at [facility]    Culture   Final    ESCHERICHIA COLI Note: Confirmed Extended Spectrum Beta-Lactamase Producer (ESBL) Performed at [facility]    Report Status 04/01/2014 FINAL  Final   Organism ID, Bacteria ESCHERICHIA COLI  Final      Susceptibility   Escherichia coli - MIC*    AMPICILLIN >=32 RESISTANT Resistant     CEFAZOLIN >=64 RESISTANT Resistant     CEFTRIAXONE >=64 RESISTANT Resistant     CIPROFLOXACIN >=4 RESISTANT Resistant     GENTAMICIN >=16 RESISTANT Resistant     LEVOFLOXACIN >=8 RESISTANT Resistant     NITROFURANTOIN 256 RESISTANT Resistant     TOBRAMYCIN 8 INTERMEDIATE Intermediate     TRIMETH/SULFA <=20 SENSITIVE Sensitive     IMIPENEM <=0.25 SENSITIVE Sensitive     PIP/TAZO 8 SENSITIVE Sensitive     * ESCHERICHIA COLI    Coagulation Studies: No results for input(s): LABPROT, INR in the last 72 hours.  Imaging: No results found.  Medications:  I have reviewed the patient's current medications. Scheduled: . aspirin  300 mg Rectal Daily   Or  . aspirin  325 mg Oral Daily  . cyanocobalamin  1,000 mcg Intramuscular Daily  . feeding supplement (GLUCERNA SHAKE)  237 mL Oral BID BM  . gabapentin  100 mg Oral BID  . hydrALAZINE  25 mg Oral 3 times per  day  . imipenem-cilastatin  250 mg Intravenous 4 times per day  . insulin aspart  0-9 Units Subcutaneous TID WC  . metoprolol  100 mg Oral BID  . polyethylene glycol  17 g Oral Daily  . senna-docusate  1 tablet Oral BID    Assessment/Plan: Patient beginning to show some improvement.  Today will be day 5 of IVIg.  Arsenic testing remains pending.    Recommendations: 1.  To complete IVIg today. 2.  Will continue to f/u arsenic with  treatment to be determined at that time.      LOS: 5 days   Thana FarrLeslie Giulianna Rocha, MD [facility] [phone] 04/04/2014  8:29 AM

## 2014-04-05 DIAGNOSIS — T570X4D Toxic effect of arsenic and its compounds, undetermined, subsequent encounter: Secondary | ICD-10-CM

## 2014-04-05 LAB — GLUCOSE, CAPILLARY: GLUCOSE-CAPILLARY: 82 mg/dL (ref 70–99)

## 2014-04-05 MED ORDER — HYDROMORPHONE HCL 2 MG PO TABS: 1.0000 mg | ORAL_TABLET | Freq: Four times a day (QID) | ORAL | Status: DC | PRN

## 2014-04-05 MED ORDER — CYANOCOBALAMIN 1000 MCG/ML IJ SOLN: 1000.0000 ug | INTRAMUSCULAR | Status: AC

## 2014-04-05 MED ORDER — SULFAMETHOXAZOLE-TRIMETHOPRIM 800-160 MG PO TABS: 1.0000 | ORAL_TABLET | Freq: Two times a day (BID) | ORAL | Status: DC

## 2014-04-05 NOTE — Progress Notes (Signed)
Physical Therapy Treatment Patient Details Name: Anna Klein MRN: [id number] DOB: 05-Jan-1946 Today's Date: 04/05/2014    History of Present Illness Anna Klein is a 69 y.o. female who was recently admitted for abdominal pain and has had cholecystectomy and at that time patient was complaining of lower extremity cramping and numbness and had neurology workup done and was discharged to nursing home yesterday but patient's daughter noted that patient has been having some left sided facial weakness. Initially patient's daughter noticed that patient was not able to close her left eyelid and when she reached the nursing home note is that patient was having left facial droop. Patient has some weakness in the right upper extremity which was noticed earlier. Patient was brought to the ER and CT head did not show anything acute and MRI brain was done which also did not show any stroke.        PT Comments    Pt. Is progressing toward d/c. Very compliant and pleasant to work with. Has very encouraging and involved daughters, one of which was present during therapy and is happy with the progress pt. Is making. Pt.  Would benefit from more gait training to correct drifting.   Follow Up Recommendations  SNF     Equipment Recommendations  Rolling walker with 5" wheels;3in1 (PT);Wheelchair (measurements PT);Wheelchair cushion (measurements PT)    Recommendations for Other Services Rehab consult     Precautions / Restrictions Precautions Precautions: Fall Precaution Comments: decreased sensation in R LE Restrictions Weight Bearing Restrictions: No    Mobility  Bed Mobility               General bed mobility comments: up in chair upon arrival  Transfers Overall transfer level: Needs assistance Equipment used: Rolling walker (2 wheeled) Transfers: Sit to/from Stand Sit to Stand: Min guard Stand pivot transfers: Min guard       General transfer comment: Pt. with weak LEs particularly  the R. Can verbalize and demonstrate handplacement on bed and gave her cues for hand placement on her ant. thigh for approximation. No buckle at right knee during transfer. .  Ambulation/Gait Ambulation/Gait assistance: Mod assist Ambulation Distance (Feet): 150 Feet Assistive device: Rolling walker (2 wheeled) Gait Pattern/deviations: Drifts right/left;Ataxic;Step-through pattern;Decreased dorsiflexion - right Gait velocity: Decreased Gait velocity interpretation: Below normal speed for age/gender General Gait Details: Pt. continues to demonstrate decreased propioception and mild ataxia during gait. mild foot drag with R LE because of dec DF at ankle.   Stairs            Wheelchair Mobility    Modified Rankin (Stroke Patients Only)       Balance Overall balance assessment: Needs assistance Sitting-balance support: Feet supported;Bilateral upper extremity supported Sitting balance-Leahy Scale: Fair     Standing balance support: Bilateral upper extremity supported;During functional activity Standing balance-Leahy Scale: Poor Standing balance comment: Pt. requires RW and min g for support when standing. When weight shifting, there was no shake; improved from yesterday.                    Cognition Arousal/Alertness: Awake/alert Behavior During Therapy: WFL for tasks assessed/performed Overall Cognitive Status: Within Functional Limits for tasks assessed                      Exercises General Exercises - Lower Extremity Ankle Circles/Pumps: AROM;Supine;Both;10 reps Quad Sets: AROM;Supine;Both;10 reps Heel Slides: AROM;Supine;10 reps Straight Leg Raises: AROM;Both;Supine;10 reps    General Comments  Pertinent Vitals/Pain Pain Score: 2  Pain Location: R shoulder Pain Descriptors / Indicators: Aching Pain Intervention(s): Monitored during session    Home Living                      Prior Function            PT Goals (current  goals can now be found in the care plan section) Progress towards PT goals: Progressing toward goals    Frequency  Min 4X/week    PT Plan Current plan remains appropriate    Co-evaluation             End of Session Equipment Utilized During Treatment: Gait belt Activity Tolerance: Patient tolerated treatment well Patient left: with call bell/phone within reach;with family/visitor present;in chair;with chair alarm set     Time: 1035-1110 PT Time Calculation (min) (ACUTE ONLY): 35 min  Charges:                       G Codes:      Perlie MayoWilson, Eulalio Reamy, SPTA 04/05/2014, 12:37 PM

## 2014-04-05 NOTE — Discharge Summary (Addendum)
Physician Discharge Summary  Anna Klein ZOX:[id number] DOB: [birth year] DOA: 03/30/2014  PCP: Amada Jupiter, PA-C  Admit date: 03/30/2014 Discharge date: 04/05/2014  Time spent: 45 minutes  Recommendations for Outpatient Follow-up:  1. Follow up with Kandra Nicolas in 1 week 2. Follow up with Neurology in 2 weeks 3. Follow up with Urology in 2-4 weeks 4. 24 h urine arsenic levels pending at the time of discharge, poison control aware, I discussed with poison control and they will get [name] PA involved for ongoing outpatient follow up. 5. Continue bactrim for 5 additional days 6. Please check BMP in 2-3 days while on Bactrim 7. Continue B12 once weekly, next dose next week for 3 weeks then once monthly 8. Please use oxygen PRN for shortness of breath   Discharge Diagnoses:  Principal Problem:   Facial droop Active Problems:   DM type 2 (diabetes mellitus, type 2)   Chronic diastolic heart failure   Acute kidney injury   GBS (Guillain-Barre syndrome)   Weakness   B12 deficiency   ESBL (extended spectrum beta-lactamase) producing bacteria infection  Discharge Condition: stable  Diet recommendation: regular  Filed Weights   03/31/14 0147  Weight: 63.685 kg (140 lb 6.4 oz)    History of present illness:  Anna Klein is a 69 y.o. female who was recently admitted for abdominal pain and has had cholecystectomy and at that time patient was complaining of lower extremity cramping and numbness and had neurology workup done and was discharged to nursing home yesterday but patient's daughter noted that patient has been having some left sided facial weakness. Initially patient's daughter noticed that patient was not able to close her left eyelid and when she reached the nursing home note is that patient was having left facial droop. Patient has some weakness in the right upper extremity which was noticed earlier. Patient was brought to the ER and CT head did not show anything acute  and MRI brain was done which also did not show any stroke. At this time patient has been admitted for further management. Patient denies any chest pain or shortness of breath. Patient still has abdominal discomfort mainly in the upper back. Denies any nausea vomiting or diarrhea. Patient still has the numbness and tingling of the lower extremities up to the midcalf. Has poor deep tendon reflexes. Patient's daughter states that [place] when patient had some crabs and lobster following which patient started developing abdominal discomfort and tingling and numbness of the lower extremity and some choking episodes.  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  - MRI brain has been negative for acute stroke.  - patient is now s/p IVIG treatment for 5 days, last dose 1/18. Neurology was consulted and have followed patient while hospitalized.  B12 deficiency - Vitamin B-12 level borderline low at 298 and with elevated MMA likely representing true B12 deficiency as it is more sensitive than the level alone, continue B12 injections E coli ESBL Urinary tract infection - resistant to Ciprofloxacin, continue Imipenem for now, will transition to Bactrim on discharge, to have for 5 additional days  Acute renal failure likely prerenal, resolved - closely follow metabolic panel. Improved after IV fluids. Uncontrolled hypertension improving Chronic diastolic heart failure recent 2-D echo done last week showed EF of 55% with grade 1 diastolic dysfunction - presently appears compensated Hyperglycemia with  recently diagnosed diabetes mellitus type 2 hemoglobin A1c was 6.5 Recent cholecystectomy - patient still has some abdominal discomfort radiating to the back. Closely observe. Cleared by [facility]. HIDA scan negative for bile leak. AST ALT has not been  elevated. 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 Poison control will make further recommendations after this level is obtained.   Procedures:  None    Consultations:  Neurology   Discharge Exam: Filed Vitals:   04/05/14 0145 04/05/14 0544 04/05/14 1018 04/05/14 1020  BP: 136/68 155/74 148/61 148/61  Pulse: 70 79 68 68  Temp: 97.4 F (36.3 C) 97.3 F (36.3 C)  97.8 F (36.6 C)  TempSrc: Oral Oral  Oral  Resp: 18 16  18   Height:      Weight:      SpO2: 100% 100%  99%   General: NAD Cardiovascular: RRR Respiratory: CTA biL  Discharge Instructions    Medication List    STOP taking these medications        ciprofloxacin 500 MG tablet  Commonly known as:  CIPRO      TAKE these medications        acetaminophen 500 MG tablet  Commonly known as:  TYLENOL  Take 500 mg by mouth every 6 (six) hours as needed for mild pain.     ALPRAZolam 0.5 MG tablet  Commonly known as:  XANAX  Take 1 tablet (0.5 mg total) by mouth 3 (three) times daily as needed for anxiety.     cyanocobalamin 1000 MCG/ML injection  Commonly known as:  (VITAMIN B-12)  Inject 1 mL (1,000 mcg total) into the muscle once a week.  Start taking on:  04/11/2014     DSS 100 MG Caps  Take 100 mg by mouth 2 (two) times daily.     feeding supplement (ENSURE COMPLETE) Liqd  Take 237 mLs by mouth 2 (two) times daily between meals.     gabapentin 100 MG capsule  Commonly known as:  NEURONTIN  Take 1 capsule (100 mg total) by mouth 2 (two) times daily.     hydrALAZINE 25 MG tablet  Commonly known as:  APRESOLINE  Take 1 tablet (25 mg total) by mouth every 8 (eight) hours.     HYDROmorphone 2 MG tablet  Commonly known as:  DILAUDID  Take 0.5-1 tablets (1-2 mg total) by mouth every 6 (six) hours as needed for severe pain.     metoprolol 100 MG tablet  Commonly known  as:  LOPRESSOR  Take 100 mg by mouth 2 (two) times daily.     ondansetron 4 MG tablet  Commonly known as:  ZOFRAN  Take 4 mg by mouth every 8 (eight) hours as needed for nausea or vomiting.     polyethylene glycol packet  Commonly known as:  MIRALAX / GLYCOLAX  Take 17 g by mouth daily.     sulfamethoxazole-trimethoprim 800-160 MG per tablet  Commonly known as:  BACTRIM DS,SEPTRA DS  Take 1 tablet by mouth 2 (two) times daily. For 5 additional days     traMADol 50 MG tablet  Commonly known as:  ULTRAM  Take 1 tablet (50 mg total) by mouth 3 (three) times daily as needed for moderate pain.       Follow-up Information    Follow up with Amada JupiterKILROY,RITA M, PA-C. Schedule an appointment as soon as possible for a visit in 1  week.   Specialty:  Physician Assistant   Contact information:   [address] [place] [address] [phone]       Follow up with Crist Fat, MD. Schedule an appointment as soon as possible for a visit in 2 weeks.   Specialty:  Urology   Contact information:   [address] [place] [address] [phone]       Follow up with [facility]. Schedule an appointment as soon as possible for a visit in 2 weeks.   Contact information:   [address] [place] [address] [phone]     The results of significant diagnostics from this hospitalization (including imaging, microbiology, ancillary and laboratory) are listed below for reference.    Significant Diagnostic Studies: Dg Chest 2 View  03/31/2014   CLINICAL DATA:  Weakness.  EXAM: CHEST  2 VIEW  COMPARISON:  03/24/2014  FINDINGS: Borderline hyperinflation and mild elevation of right hemidiaphragm. The heart size is normal, there is tortuosity of the thoracic aorta. Pulmonary vasculature is normal. Moderate hiatal hernia is again seen. There is no consolidation. No pleural effusion or pneumothorax. Compression deformity at the lower thoracic  spine, unchanged.  IMPRESSION: No acute pulmonary process.   Electronically Signed   By: Rubye Oaks M.D.   On: 03/31/2014 01:44   Dg Chest 2 View  03/24/2014   CLINICAL DATA:  Chest pain for 3 days.  EXAM: CHEST  2 VIEW  COMPARISON:  CT scan of March 22, 2014.  FINDINGS: The heart size and mediastinal contours are within normal limits. Both lungs are clear. No pneumothorax or pleural effusion is noted. Compression deformity of lower thoracic vertebral body is noted consistent with old fracture.  IMPRESSION: No acute cardiopulmonary abnormality seen.   Electronically Signed   By: Roque Lias M.D.   On: 03/24/2014 20:01   Dg Abd 1 View  03/31/2014   CLINICAL DATA:  Acute onset of generalized abdominal pain and back pain. Initial encounter.  EXAM: ABDOMEN - 1 VIEW  COMPARISON:  CT of the abdomen and pelvis from 03/22/2014  FINDINGS: The visualized bowel gas pattern is unremarkable. Scattered air and contrast filled loops of colon are seen; no abnormal dilatation of small bowel loops is seen to suggest small bowel obstruction. No free intra-abdominal air is identified, though evaluation for free air is limited on a single supine view.  There is mild rotatory scoliosis along the lumbar spine, with associated degenerative change. The sacroiliac joints are unremarkable.  IMPRESSION: 1. Unremarkable bowel gas pattern; no free intra-abdominal air seen. 2. 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   Electronically Signed   By: Roanna Raider M.D.   On: 03/31/2014 06:45   Dg Cholangiogram Operative  03/25/2014   CLINICAL DATA:  69 year old female with cholecystectomy.  EXAM: INTRAOPERATIVE CHOLANGIOGRAM  COMPARISON:  CT 03/22/2014  FINDINGS: Limited intraoperative fluoroscopic spot images of the upper abdomen during cholecystectomy.  Images demonstrate cannulation of the cystic duct at the level of surgical clips.  There is antegrade infusion through the cystic duct,  with partial opacification of intrahepatic ducts at the liver hilum. Contrast fills the common hepatic duct and common bile duct, as well as partially within the pancreatic duct.  Contrast flows cross the ampulla into the duodenum.  There are a few is rounded mobile filling defects of the common hepatic duct.  No extraluminal contrast.  Surgical sponge projects over the upper abdomen. Retained  contrast within the colon.  IMPRESSION: Intraoperative cholangiogram demonstrates cannulation of the cystic duct, and antegrade infusion of contrast into the common hepatic duct, common bile duct, and across the ampulla. Contrast enters the duodenum, with unremarkable caliber of the extrahepatic ducts.  Mobile filling defects of the common hepatic duct may represent air bubbles or debris.  Surgical sponge projects over the midline.  Please refer to the dictated operative report for full details of intraoperative findings and procedure.  Signed,  Yvone Neu. Loreta Ave, DO  [facility]  [facility]   Electronically Signed   By: Gilmer Mor D.O.   On: 03/25/2014 15:35   Ct Head Wo Contrast  03/30/2014   CLINICAL DATA:  Code stroke. Left-sided facial droop and left arm weakness.  EXAM: CT HEAD WITHOUT CONTRAST  TECHNIQUE: Contiguous axial images were obtained from the base of the skull through the vertex without intravenous contrast.  COMPARISON:  None.  FINDINGS: There is no evidence of acute infarction, mass lesion, or intra- or extra-axial hemorrhage on CT.  Mild cerebellar atrophy is noted.  The brainstem and fourth ventricle are within normal limits. The third and lateral ventricles, and basal ganglia are unremarkable in appearance. The cerebral hemispheres are symmetric in appearance, with normal gray-white differentiation. No mass effect or midline shift is seen.  There is no evidence of fracture; visualized osseous structures are unremarkable in appearance. The orbits are  within normal limits. The paranasal sinuses and mastoid air cells are well-aerated. No significant soft tissue abnormalities are seen.  IMPRESSION: 1. No acute intracranial pathology seen on CT. 2. Mild cerebellar atrophy noted.  These results were called by telephone at the time of interpretation on 03/30/2014 at 10:05 pm to Dr. Margarita Grizzle, who verbally acknowledged these results.   Electronically Signed   By: Roanna Raider M.D.   On: 03/30/2014 22:07   Mr Brain Wo Contrast  03/31/2014   CLINICAL DATA:  Discharged today, status post cholecystitis. Worsening of peripheral neuropathy, unable to ambulate. Acute onset LEFT facial droop today at 6 p.m. by arm weakness.  EXAM: MRI HEAD WITHOUT CONTRAST  MRI CERVICAL SPINE WITHOUT CONTRAST  TECHNIQUE: Multiplanar, multiecho pulse sequences of the brain and surrounding structures, and cervical spine, to include the craniocervical junction and cervicothoracic junction, were obtained without intravenous contrast.  COMPARISON:  CT of the head March 30, 2014 at 2157 hr  FINDINGS: MRI HEAD FINDINGS  No reduced diffusion to suggest acute ischemia. No susceptibility artifact to suggest hemorrhage.  Ventricles and sulci are normal for patient's age. Minimal white matter changes suggest chronic small vessel ischemic disease, less than expected for age. No midline shift or mass effect.  No abnormal extra-axial fluid collections. Ocular globes and orbital contents are unremarkable though not tailored for evaluation. Paranasal sinuses the mastoid air cells are well aerated. No abnormal sellar expansion. No cerebellar tonsillar ectopia. No suspicious calvarial bone marrow signal.  MRI CERVICAL SPINE FINDINGS  Moderately motion degraded examination. Vertebral bodies appear intact and aligned with straightened cervical lordosis. C6-7 interbody arthrodesis. Moderate to severe C3-4, C4-5 degenerative disc with compensatory chronic endplate changes. Severe C5-6 degenerative disc and  endplate changes, appearing chronic. No STIR signal abnormality to suggest acute osseous process.  Motion limits assessment for subtle cord signal abnormality, no cervical spinal cord syrinx. Craniocervical junction appears intact. Included prevertebral and paraspinal soft tissues are unremarkable.  Level by level evaluation (moderate to severely motion degraded axial sequences limits evaluation):  C2-3: Moderate facet  arthropathy without canal stenosis or neural foraminal narrowing.  C3-4: Annular bulging, uncovertebral hypertrophy and ileus mild facet arthropathy. Mild canal stenosis. Suspected at least mild to moderate moderate bilateral neural foraminal narrowing.  C4-5: Small broad-based disc bulge, uncovertebral hypertrophy and small LEFT central disc protrusion results in mild canal stenosis. Suspected at least moderate RIGHT neural foraminal narrowing.  C5-6: Approximate 3 mm LEFT central disc protrusion. Uncovertebral hypertrophy and mild facet arthropathy result in moderate to severe canal stenosis, AP dimension the canal is approximately 6 mm. Suspected moderate bilateral neural foraminal narrowing.  C6-7: Moderate broad-based disc osteophyte complex, moderate canal stenosis. No definite neural foraminal narrowing.  C7-T1: Small broad-based disc bulge, uncovertebral hypertrophy without canal stenosis. Mild bilateral neural foraminal narrowing.  IMPRESSION: MRI HEAD: No acute intracranial process ; no acute ischemia. Normal noncontrast MRI of the brain for age.  MRI CERVICAL SPINE: Moderately motion degraded examination. No acute fracture nor malalignment.  C5-6 arthrodesis.  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  Neural foraminal narrowing C3-4 through C5-6, C7-T1: At least moderate on the RIGHT at C4-5 and bilaterally at C5-6.   Electronically Signed   By: Awilda Metro   On: 03/31/2014 01:17   Mr  Cervical Spine Wo Contrast  03/31/2014   CLINICAL DATA:  Discharged today, status post cholecystitis. Worsening of peripheral neuropathy, unable to ambulate. Acute onset LEFT facial droop today at 6 p.m. by arm weakness.  EXAM: MRI HEAD WITHOUT CONTRAST  MRI CERVICAL SPINE WITHOUT CONTRAST  TECHNIQUE: Multiplanar, multiecho pulse sequences of the brain and surrounding structures, and cervical spine, to include the craniocervical junction and cervicothoracic junction, were obtained without intravenous contrast.  COMPARISON:  CT of the head March 30, 2014 at 2157 hr  FINDINGS: MRI HEAD FINDINGS  No reduced diffusion to suggest acute ischemia. No susceptibility artifact to suggest hemorrhage.  Ventricles and sulci are normal for patient's age. Minimal white matter changes suggest chronic small vessel ischemic disease, less than expected for age. No midline shift or mass effect.  No abnormal extra-axial fluid collections. Ocular globes and orbital contents are unremarkable though not tailored for evaluation. Paranasal sinuses the mastoid air cells are well aerated. No abnormal sellar expansion. No cerebellar tonsillar ectopia. No suspicious calvarial bone marrow signal.  MRI CERVICAL SPINE FINDINGS  Moderately motion degraded examination. Vertebral bodies appear intact and aligned with straightened cervical lordosis. C6-7 interbody arthrodesis. Moderate to severe C3-4, C4-5 degenerative disc with compensatory chronic endplate changes. Severe C5-6 degenerative disc and endplate changes, appearing chronic. No STIR signal abnormality to suggest acute osseous process.  Motion limits assessment for subtle cord signal abnormality, no cervical spinal cord syrinx. Craniocervical junction appears intact. Included prevertebral and paraspinal soft tissues are unremarkable.  Level by level evaluation (moderate to severely motion degraded axial sequences limits evaluation):  C2-3: Moderate facet arthropathy without canal stenosis  or neural foraminal narrowing.  C3-4: Annular bulging, uncovertebral hypertrophy and ileus mild facet arthropathy. Mild canal stenosis. Suspected at least mild to moderate moderate bilateral neural foraminal narrowing.  C4-5: Small broad-based disc bulge, uncovertebral hypertrophy and small LEFT central disc protrusion results in mild canal stenosis. Suspected at least moderate RIGHT neural foraminal narrowing.  C5-6: Approximate 3 mm LEFT central disc protrusion. Uncovertebral hypertrophy and mild facet arthropathy result in moderate to severe canal stenosis, AP dimension the canal is approximately 6 mm. Suspected moderate bilateral neural foraminal narrowing.  C6-7: Moderate broad-based  disc osteophyte complex, moderate canal stenosis. No definite neural foraminal narrowing.  C7-T1: Small broad-based disc bulge, uncovertebral hypertrophy without canal stenosis. Mild bilateral neural foraminal narrowing.  IMPRESSION: MRI HEAD: No acute intracranial process ; no acute ischemia. Normal noncontrast MRI of the brain for age.  MRI CERVICAL SPINE: Moderately motion degraded examination. No acute fracture nor malalignment.  C5-6 arthrodesis.  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  Neural foraminal narrowing C3-4 through C5-6, C7-T1: At least moderate on the RIGHT at C4-5 and bilaterally at C5-6.   Electronically Signed   By: Awilda Metro   On: 03/31/2014 01:17   Mr Thoracic Spine Wo Contrast  03/28/2014   CLINICAL DATA:  Neuropathy. Fall. New lower extremity weakness and decreased sensation in the feet bilaterally.  EXAM: MRI THORACIC SPINE WITHOUT CONTRAST  TECHNIQUE: Multiplanar, multisequence MR imaging of the thoracic spine was performed. No intravenous contrast was administered.  COMPARISON:  Two-view chest x-ray 03/24/2014.  FINDINGS: A remote anterior fracture is present at L1. Schmorl's nodes are present at  T12-L1 and L1-2. There is focal kyphosis at the at L1 level. Normal signal is present throughout the thoracic spinal cord. As rightward curvature of thoracic spine centered at T6.  No significant focal central canal stenosis is present. Small disc protrusions are present.  Mild right foraminal narrowing is present at T10-11 and T11-12 due to facet spurring at these levels. Mild left foraminal narrowing is present at T6-7. There is foraminal narrowing bilaterally at C7-T1 and T1-2, worse on the right.  Atherosclerotic changes are noted in the aorta without aneurysm. A large hiatal hernia is present.  IMPRESSION: 1. Exaggerated kyphosis centered at L1 were a remote fracture is evident. 2. Rightward curvature of the thoracic spine is centered at T6. 3. No significant central canal stenosis. 4. Mild foraminal disease as described.   Electronically Signed   By: Gennette Pac M.D.   On: 03/28/2014 15:56   Nm Hepatobiliary Liver Func  03/31/2014   CLINICAL DATA:  Status post cholecystectomy 4 days ago, abdominal pain  EXAM: NUCLEAR MEDICINE HEPATOBILIARY IMAGING  TECHNIQUE: Sequential images of the abdomen were obtained out to 60 minutes following intravenous administration of radiopharmaceutical.  RADIOPHARMACEUTICALS:  Five [employer] Tc-52m Choletec  COMPARISON:  CT scan of the abdomen and pelvis of March 22, 2014, and supine abdominal film of today's date.  FINDINGS: There is adequate uptake of the radiopharmaceutical by the liver. Activity is visible in the common bile duct and bowel by 20 min. There is transient low-level activity over the inferior aspect of the right hepatic lobe on the 25 and 30 min images which is never again visualized out to 120 min. This is due to the patient intermittently moving her arm contained injection site over the field-of-view. There is no increasing accumulation of activity outside of bowel to suggest a bile leak.  IMPRESSION: There are no findings suspicious for a bile leak.  There is no common bile duct obstruction.   Electronically Signed   By: David  [place]   On: 03/31/2014 15:49   US Abdomen Complete  03/24/2014   CLINICAL DATA:  Chest pain.  Hypertension.  EXAM: ULTRASOUND ABDOMEN COMPLETE  COMPARISON:  CT abdomen and pelvis 03/22/2014.  FINDINGS: Gallbladder: Multiple stones in the dependent gallbladder, largest measuring 1.2 cm diameter. No gallbladder wall thickening or edema. Murphy's sign is negative.  Common bile duct: Diameter: 3.5 mm, normal  Liver: No focal lesion identified. Within normal limits in parenchymal echogenicity.  IVC: Not visualized due to overlying bowel gas.  Pancreas: Not visualized due to overlying bowel gas.  Spleen: Size and appearance within normal limits.  Right Kidney: Length: 7.9 cm. Mild diffuse parenchymal thinning and increased echotexture suggesting medical renal disease. No solid mass or hydronephrosis.  Left Kidney: Length: 9.1 cm. Mild diffuse parenchymal thinning and increased echotexture suggesting medical renal disease. No solid mass or hydronephrosis.  Abdominal aorta: Not visualized due to overlying bowel gas.  Other findings: None.  IMPRESSION: Cholelithiasis without additional changes of cholecystitis. Mild renal parenchymal changes suggesting chronic medical renal disease. No hydronephrosis.   Electronically Signed   By: Burman Nieves M.D.   On: 03/24/2014 22:03   Dg Fluoro Guide Ndl Plc/bx  04/01/2014   CLINICAL DATA:  Peripheral neuropathy with bilateral leg weakness and numbness and facial droop. Clinical concern for Guillain-Barre bulla syndrome.  EXAM: DIAGNOSTIC LUMBAR PUNCTURE UNDER FLUOROSCOPIC GUIDANCE  FLUOROSCOPY TIME:  0 min 24 seconds  PROCEDURE: Informed consent was obtained from the patient prior to the procedure, including potential complications of headache, allergy, and pain. With the patient prone, the lower back was prepped with Betadine. 1% Lidocaine was used for local anesthesia. Lumbar puncture was  performed at the L5-S1 level using a 20 gauge needle with return of clear, colorless CSF with an opening pressure of 13 cm water. 8ml of CSF were obtained for laboratory studies. No additional fluid could be obtained. The closing pressure was less than 10 cm of water. The patient tolerated the procedure well and there were no apparent complications.  IMPRESSION: Successful fluoroscopic guided lumbar puncture, as described above.   Electronically Signed   By: Gordan Payment M.D.   On: 04/01/2014 14:01   Microbiology: Recent Results (from the past 240 hour(s))  Culture, Urine     Status: None   Collection Time: 03/30/14  2:32 PM  Result Value Ref Range Status   Specimen Description URINE, CLEAN CATCH  Final   Special Requests ADDED [id number]  Final   Colony Count   Final    >=100,000 COLONIES/ML Performed at [facility]    Culture   Final    ESCHERICHIA COLI Note: Confirmed Extended Spectrum Beta-Lactamase Producer (ESBL) Performed at [facility]    Report Status 04/01/2014 FINAL  Final   Organism ID, Bacteria ESCHERICHIA COLI  Final      Susceptibility   Escherichia coli - MIC*    AMPICILLIN >=32 RESISTANT Resistant     CEFAZOLIN >=64 RESISTANT Resistant     CEFTRIAXONE >=64 RESISTANT Resistant     CIPROFLOXACIN >=4 RESISTANT Resistant     GENTAMICIN >=16 RESISTANT Resistant     LEVOFLOXACIN >=8 RESISTANT Resistant     NITROFURANTOIN 256 RESISTANT Resistant     TOBRAMYCIN 8 INTERMEDIATE Intermediate     TRIMETH/SULFA <=20 SENSITIVE Sensitive     IMIPENEM <=0.25 SENSITIVE Sensitive     PIP/TAZO 8 SENSITIVE Sensitive     * ESCHERICHIA COLI   Labs: Basic Metabolic Panel:  Recent Labs Lab 03/31/14 0538 04/01/14 0525 04/02/14 1500 04/03/14 0500 04/04/14 0638  NA 134* 131* 135 132* 132*  K 4.6 3.2* 3.6 3.4* 3.4*  CL 95* 98 105 103 103  CO2 GLUCOSE 164* 124* 116* 126* 114*  BUN 46* 27* CREATININE 1.62* 1.09 0.95 0.85 0.94  CALCIUM  9.7 8.5 8.8 8.5 8.9  MG  --  2.1  --   --   --    Liver Function Tests:  Recent Labs Lab 03/31/14 0538 04/01/14 0525 04/02/14 1500 04/03/14 0500 04/04/14 0638  AST 42* 23 21 19 21   ALT 40* 28 21 21 21   ALKPHOS 103 86 80 75 74  BILITOT 1.8* 0.8 0.4 0.6 0.6  PROT 6.7 6.4 6.3 6.8 7.3  ALBUMIN 3.9 3.0* 2.8* 2.6* 2.6*    Recent Labs Lab 04/01/14 0830  LIPASE 57   CBC:  Recent Labs Lab 03/30/14 2137 03/31/14 1136 04/01/14 0525 04/04/14 0638  WBC 14.4* 11.3* 6.3 5.6  NEUTROABS 10.2*  --   --   --   HGB 14.7 14.3 11.8* 11.6*  HCT 43.0 42.7 35.2* 34.7*  MCV 83.5 85.4 85.9 84.8  PLT 294 274 207 206   Cardiac Enzymes: No results for input(s): CKTOTAL, CKMB, CKMBINDEX, TROPONINI in the last 168 hours. CBG:  Recent Labs Lab 04/04/14 0641 04/04/14 1155 04/04/14 1650 04/04/14 2211 04/05/14 0639  GLUCAP 102* 146* 105* 119* 82   Signed:  GHERGHE, COSTIN  [facility] 04/05/2014, 12:58 PM

## 2014-04-05 NOTE — Progress Notes (Signed)
Subjective: Patient reports that her numbness is unchanged from yesterday.  Is somewhat stronger.  Has now completed her IVIg therapy.    Objective: Current vital signs: BP 148/61 mmHg  Pulse 68  Temp(Src) 97.8 F (36.6 C) (Oral)  Resp 18  Ht  (1.549 m)  Wt 63.685 kg (140 lb 6.4 oz)  BMI 26.54 kg/m2  SpO2 99% Vital signs in last 24 hours: Temp:  [97.3 F (36.3 C)-98.3 F (36.8 C)] 97.8 F (36.6 C) (01/19 1020) Pulse Rate:  [68-79] 68 (01/19 1020) Resp:  [16-20] 18 (01/19 1020) BP: (123-155)/(47-74) 148/61 mmHg (01/19 1020) SpO2:  [99 %-100 %] 99 % (01/19 1020)  Intake/Output from previous day:   Intake/Output this shift:   Nutritional status: Diet Carb Modified  Neurologic Exam: Mental Status: Alert, oriented, thought content appropriate. Speech fluent without evidence of aphasia. Able to follow 3 step commands without difficulty. Cranial Nerves: II: Discs flat bilaterally; Visual fields grossly normal, pupils equal, round, reactive to light and accommodation III,IV, VI: ptosis not present, extra-ocular motions intact bilaterally V,VII: left facial droop with complete left eye closure although weaker than right, facial light touch sensation normal bilaterally VIII: hearing normal bilaterally IX,X: gag reflex present XI: bilateral shoulder shrug XII: midline tongue extension Motor: 5-/5 right deltoid with 5/5 hand grip bilaterally. 5/5 proximally in the lower extremities with 5-/5 plantar flexion bilaterally.  Sensory: Pinprick and light touch intact decreased in the hands bilaterally to the wrists and in the feet bilaterally to just above the ankles.  Deep Tendon Reflexes: 1+ in the upper extremities and absent in the lower extremities.  Plantars: Right: muteLeft: mute Cerebellar: normal finger-to-nose testing bilaterally  Lab Results: Basic Metabolic Panel:  Recent Labs Lab 03/31/14 0538 04/01/14 0525 04/02/14 1500  04/03/14 0500 04/04/14 0638  NA 134* 131* 135 132* 132*  K 4.6 3.2* 3.6 3.4* 3.4*  CL 95* 98 105 103 103  CO2 GLUCOSE 164* 124* 116* 126* 114*  BUN 46* 27* CREATININE 1.62* 1.09 0.95 0.85 0.94  CALCIUM 9.7 8.5 8.8 8.5 8.9  MG  --  2.1  --   --   --     Liver Function Tests:  Recent Labs Lab 03/31/14 0538 04/01/14 0525 04/02/14 1500 04/03/14 0500 04/04/14 0638  AST 42* ALT 40* ALKPHOS 103 86 80 75 74  BILITOT 1.8* 0.8 0.4 0.6 0.6  PROT 6.7 6.4 6.3 6.8 7.3  ALBUMIN 3.9 3.0* 2.8* 2.6* 2.6*    Recent Labs Lab 04/01/14 0830  LIPASE 57   No results for input(s): AMMONIA in the last 168 hours.  CBC:  Recent Labs Lab 03/30/14 2137 03/31/14 1136 04/01/14 0525 04/04/14 0638  WBC 14.4* 11.3* 6.3 5.6  NEUTROABS 10.2*  --   --   --   HGB 14.7 14.3 11.8* 11.6*  HCT 43.0 42.7 35.2* 34.7*  MCV 83.5 85.4 85.9 84.8  PLT 294 274 207 206    Cardiac Enzymes: No results for input(s): CKTOTAL, CKMB, CKMBINDEX, TROPONINI in the last 168 hours.  Lipid Panel: No results for input(s): CHOL, TRIG, HDL, CHOLHDL, VLDL, LDLCALC in the last 168 hours.  CBG:  Recent Labs Lab 04/04/14 0641 04/04/14 1155 04/04/14 1650 04/04/14 2211 04/05/14 0639  GLUCAP 102* 146* 105* 119* 82    Microbiology: Results for orders placed or performed during the hospital encounter of 03/24/14  Urine culture  Status: None   Collection Time: 03/24/14  7:09 PM  Result Value Ref Range Status   Specimen Description URINE, CLEAN CATCH  Final   Special Requests Normal  Final   Colony Count   Final    3,000 COLONIES/ML Performed at [facility]    Culture   Final    INSIGNIFICANT GROWTH Performed at [facility]    Report Status 03/25/2014 FINAL  Final  Surgical pcr screen     Status: Abnormal   Collection Time: 03/25/14 11:19 AM  Result Value Ref Range Status   MRSA, PCR NEGATIVE NEGATIVE Final   Staphylococcus aureus  POSITIVE (A) NEGATIVE Final    Comment:        The Xpert SA Assay (FDA approved for NASAL specimens in patients over 71 years of age), is one component of a comprehensive surveillance program.  Test performance has been validated by [facility] for patients greater than or equal to 71 year old. It is not intended to diagnose infection nor to guide or monitor treatment.   Culture, Urine     Status: None   Collection Time: 03/30/14  2:32 PM  Result Value Ref Range Status   Specimen Description URINE, CLEAN CATCH  Final   Special Requests ADDED [id number]  Final   Colony Count   Final    >=100,000 COLONIES/ML Performed at [facility]    Culture   Final    ESCHERICHIA COLI Note: Confirmed Extended Spectrum Beta-Lactamase Producer (ESBL) Performed at [facility]    Report Status 04/01/2014 FINAL  Final   Organism ID, Bacteria ESCHERICHIA COLI  Final      Susceptibility   Escherichia coli - MIC*    AMPICILLIN >=32 RESISTANT Resistant     CEFAZOLIN >=64 RESISTANT Resistant     CEFTRIAXONE >=64 RESISTANT Resistant     CIPROFLOXACIN >=4 RESISTANT Resistant     GENTAMICIN >=16 RESISTANT Resistant     LEVOFLOXACIN >=8 RESISTANT Resistant     NITROFURANTOIN 256 RESISTANT Resistant     TOBRAMYCIN 8 INTERMEDIATE Intermediate     TRIMETH/SULFA <=20 SENSITIVE Sensitive     IMIPENEM <=0.25 SENSITIVE Sensitive     PIP/TAZO 8 SENSITIVE Sensitive     * ESCHERICHIA COLI    Coagulation Studies: No results for input(s): LABPROT, INR in the last 72 hours.  Imaging: No results found.  Medications:  I have reviewed the patient's current medications. Scheduled: . aspirin  300 mg Rectal Daily   Or  . aspirin  325 mg Oral Daily  . feeding supplement (GLUCERNA SHAKE)  237 mL Oral BID BM  . gabapentin  100 mg Oral BID  . hydrALAZINE  25 mg Oral 3 times per day  . imipenem-cilastatin  250 mg Intravenous 4 times per day  . insulin aspart  0-9 Units Subcutaneous TID WC   . metoprolol  100 mg Oral BID  . polyethylene glycol  17 g Oral Daily  . senna-docusate  1 tablet Oral BID    Assessment/Plan: Patient s/p 5 treatments IVIg.  Has had improvement in strength and sensation although not back to baseline.  Arsenic testing remains pending.    Recommendations: 1.  Follow up with neurology on an outpatient basis.   2.  Arsenic testing to be followed up on an outpatient basis  Case discussed with Dr. Elvera LennoxGherghe   LOS: 6 days   Thana FarrLeslie Corazon Nickolas, MD [facility] [phone] 04/05/2014  3:20 PM

## 2014-04-05 NOTE — Progress Notes (Signed)
Pt transported out of unit per wheelchair by nurse tech to lobby,. No acute distress noted.  [facility] daughter.  Andrew AuVafiadis, [place] Zaino I 04/05/2014 3:33 PM

## 2014-04-05 NOTE — Progress Notes (Signed)
Report given to Alexia FreestonePatty, Charity fundraiserN at [facility].  Pt to be transported per private car by daughter. PICC line to be removed per MD's order; IV consult has been placed for removal. Will monitor   Kevyn Wengert I 04/05/2014 11:39 AM

## 2014-04-05 NOTE — Clinical Social Work Note (Signed)
CSW met pt and her daughter [name] at bedside. CSW introduce self and purpose of visit. CSW informed the pt that she will be discharge back to [facility] today. CSW and pt discussed ambulance transport. Pt reported that [name] will transport her. CSW informed Melissa at [facility] regarding the pt return. CSW upload the pt's discharge summary. Bedside RN can call reported to [phone] room [phone].   [place], MSW, [place]

## 2014-04-06 LAB — GLUCOSE, CAPILLARY: GLUCOSE-CAPILLARY: 134 mg/dL — AB (ref 70–99)

## 2014-04-06 LAB — OLIGOCLONAL BANDS, CSF + SERM

## 2014-04-07 LAB — HEAVY METALS, RANDOM URINE
Arsenic Random, Urine: 291 mcg/g creat — ABNORMAL HIGH (ref <51)
CREATRANDUR: 36.2 mg/dL (ref 20.0–320.0)

## 2014-04-07 LAB — ARSENIC, URINE, 24 HOUR
ARSENIC /G CR 24H UR: 155 ug/g{creat}
ARSENIC UR: 51 ng/mL
Creatinine, Urine-mg/dL-ARSUR: 33 mg/dL (ref >50)

## 2014-04-11 LAB — HEAVY METALS SCREEN, URINE

## 2014-04-21 ENCOUNTER — Telehealth: Payer: Self-pay | Admitting: Neurology

## 2014-04-21 ENCOUNTER — Ambulatory Visit: Payer: Medicare Other | Admitting: Neurology

## 2014-04-21 NOTE — Telephone Encounter (Signed)
I called the patient, talk with the daughter. 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 The patient appears to be improving significantly following the IVIG treatments. I will try to get the patient rescheduled for a new patient evaluation, we will need to get nerve conduction studies done at some point.

## 2014-04-21 NOTE — Telephone Encounter (Signed)
This patient did not show for a new patient appointment today. 

## 2014-04-21 NOTE — Telephone Encounter (Signed)
Pt's daughter is calling needing to know what the results are for 24 urine test that was performed in the hospital.  She would like these results before the patient comes to the office today.  Please call and advise. Pt has an appointment @ 1:30 today.  If the results are not in she does not feel she needs to bring the pt to the appointment. The pt is in a facility. She spoke with the pt's PCP office and they stated that the test was cancelled. Please advise.

## 2014-04-21 NOTE — Telephone Encounter (Signed)
Spoke to patient's daughter and there seems to be some confusion between discharge from hospital, her PCP and results of 24 hour urinalysis.  She was told by the PCP that her arsenic urine lab was cancelled.  According to patients records the results are in.  She cancelled the appointment because if the testing wasn't done she didn't know how the patient could receive proper treatment.  She was also told at the hospital that she had to follow up with us, because our office was consulted in her case.  I explained that I wasn't sure if our physician spoke to neuro hospitalist but I would check with him about how we need to proceed with patient's care, and we will be in contact.

## 2014-04-22 ENCOUNTER — Encounter: Payer: Self-pay | Admitting: Neurology

## 2014-04-22 NOTE — Telephone Encounter (Signed)
Spoke to patient's daughter and she is scheduled for 05-02-14.

## 2014-05-02 ENCOUNTER — Ambulatory Visit: Payer: Self-pay | Admitting: Neurology

## 2015-05-08 ENCOUNTER — Emergency Department (HOSPITAL_COMMUNITY): Payer: Medicare Other

## 2015-05-08 ENCOUNTER — Encounter (HOSPITAL_COMMUNITY): Payer: Self-pay

## 2015-05-08 ENCOUNTER — Emergency Department (HOSPITAL_COMMUNITY)
Admission: EM | Admit: 2015-05-08 | Discharge: 2015-05-08 | Disposition: A | Discharge status: Home / Self Care | Payer: Medicare Other | Attending: Emergency Medicine | Admitting: Emergency Medicine

## 2015-05-08 DIAGNOSIS — Z87448 Personal history of other diseases of urinary system: Secondary | ICD-10-CM | POA: Insufficient documentation

## 2015-05-08 DIAGNOSIS — R1032 Left lower quadrant pain: Secondary | ICD-10-CM | POA: Insufficient documentation

## 2015-05-08 DIAGNOSIS — E119 Type 2 diabetes mellitus without complications: Secondary | ICD-10-CM | POA: Diagnosis not present

## 2015-05-08 DIAGNOSIS — R159 Full incontinence of feces: Secondary | ICD-10-CM | POA: Diagnosis not present

## 2015-05-08 DIAGNOSIS — Z79899 Other long term (current) drug therapy: Secondary | ICD-10-CM | POA: Insufficient documentation

## 2015-05-08 DIAGNOSIS — R1012 Left upper quadrant pain: Secondary | ICD-10-CM | POA: Diagnosis not present

## 2015-05-08 DIAGNOSIS — R531 Weakness: Secondary | ICD-10-CM | POA: Diagnosis present

## 2015-05-08 DIAGNOSIS — I10 Essential (primary) hypertension: Secondary | ICD-10-CM | POA: Insufficient documentation

## 2015-05-08 DIAGNOSIS — R197 Diarrhea, unspecified: Secondary | ICD-10-CM

## 2015-05-08 HISTORY — DX: Disorder of kidney and ureter, unspecified: N28.9

## 2015-05-08 LAB — URINALYSIS, ROUTINE W REFLEX MICROSCOPIC
Bilirubin Urine: NEGATIVE
Glucose, UA: NEGATIVE mg/dL
Ketones, ur: NEGATIVE mg/dL
NITRITE: NEGATIVE
PROTEIN: NEGATIVE mg/dL
SPECIFIC GRAVITY, URINE: 1.007 (ref 1.005–1.030)
pH: 6 (ref 5.0–8.0)

## 2015-05-08 LAB — CBC WITH DIFFERENTIAL/PLATELET
BASOS ABS: 0 10*3/uL (ref 0.0–0.1)
BASOS PCT: 0 %
EOS ABS: 0 10*3/uL (ref 0.0–0.7)
EOS PCT: 0 %
HCT: 34.9 % — ABNORMAL LOW (ref 36.0–46.0)
HEMOGLOBIN: 11.7 g/dL — AB (ref 12.0–15.0)
Lymphocytes Relative: 8 %
Lymphs Abs: 0.5 10*3/uL — ABNORMAL LOW (ref 0.7–4.0)
MCH: 29.2 pg (ref 26.0–34.0)
MCHC: 33.5 g/dL (ref 30.0–36.0)
MCV: 87 fL (ref 78.0–100.0)
Monocytes Absolute: 1 10*3/uL (ref 0.1–1.0)
Monocytes Relative: 15 %
NEUTROS PCT: 77 %
Neutro Abs: 5.1 10*3/uL (ref 1.7–7.7)
PLATELETS: 147 10*3/uL — AB (ref 150–400)
RBC: 4.01 MIL/uL (ref 3.87–5.11)
RDW: 14 % (ref 11.5–15.5)
WBC: 6.6 10*3/uL (ref 4.0–10.5)

## 2015-05-08 LAB — COMPREHENSIVE METABOLIC PANEL
ALT: 16 U/L (ref 14–54)
AST: 24 U/L (ref 15–41)
Albumin: 3.2 g/dL — ABNORMAL LOW (ref 3.5–5.0)
Alkaline Phosphatase: 68 U/L (ref 38–126)
Anion gap: 13 (ref 5–15)
BILIRUBIN TOTAL: 0.4 mg/dL (ref 0.3–1.2)
BUN: 23 mg/dL — AB (ref 6–20)
CHLORIDE: 102 mmol/L (ref 101–111)
CO2: 22 mmol/L (ref 22–32)
CREATININE: 1.6 mg/dL — AB (ref 0.44–1.00)
Calcium: 9 mg/dL (ref 8.9–10.3)
GFR, EST AFRICAN AMERICAN: 36 mL/min — AB (ref >60)
GFR, EST NON AFRICAN AMERICAN: 31 mL/min — AB (ref >60)
Glucose, Bld: 113 mg/dL — ABNORMAL HIGH (ref 65–99)
Potassium: 3.7 mmol/L (ref 3.5–5.1)
Sodium: 137 mmol/L (ref 135–145)
TOTAL PROTEIN: 6.5 g/dL (ref 6.5–8.1)

## 2015-05-08 LAB — URINE MICROSCOPIC-ADD ON

## 2015-05-08 LAB — I-STAT CG4 LACTIC ACID, ED
LACTIC ACID, VENOUS: 1.17 mmol/L (ref 0.5–2.0)
LACTIC ACID, VENOUS: 1.87 mmol/L (ref 0.5–2.0)

## 2015-05-08 MED ORDER — IOHEXOL 300 MG/ML  SOLN
80.0000 mL | Freq: Once | INTRAMUSCULAR | Status: AC | PRN
  Administered 2015-05-08: 80 mL via INTRAVENOUS

## 2015-05-08 MED ORDER — SODIUM CHLORIDE 0.9 % IV SOLN: INTRAVENOUS | Status: DC

## 2015-05-08 MED ORDER — SODIUM CHLORIDE 0.9 % IV BOLUS (SEPSIS)
500.0000 mL | Freq: Once | INTRAVENOUS | Status: AC
  Administered 2015-05-08: 500 mL via INTRAVENOUS

## 2015-05-08 NOTE — ED Notes (Signed)
Patient here with increasing weakness, currently being treated for UTI. Started on cipro and switched to bactrim on Saturday following urine culture. Patient is also having bowel incontinence with any intake and often with movement. Decreased appetite, alert and oriented

## 2015-05-08 NOTE — ED Provider Notes (Signed)
CSN: [id number]     Arrival date & time 05/08/15  1212 History   First MD Initiated Contact with Patient 05/08/15 1825     Chief Complaint  Patient presents with  . Weakness     (Consider location/radiation/quality/duration/timing/severity/associated sxs/prior Treatment) HPI   Anna Klein is a 70 y.o. female who presents for evaluation of stool incontinence, worsening over the last 3 weeks but present intermittently for several months. She also has fever, and acute urinary tract infection, which been treated over the last 6 days, initially with Macrobid, then changing to Septra, 3 days ago. A change of antibiotic was based on culture. She denies weakness, dizziness, nausea, vomiting. She avoids eating because she usually has a bowel movement within 10 minutes of eating, most foods. Stool is very loose but not watery. She feels that she can tolerate some food, such as crackers with peanut butter. She is taking Tylenol regularly for fever. No prior similar problem. 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 She had a colonoscopy 5 years ago and was told it was normal. No history of diverticulitis or colitis. There are no other known modifying factors.    Past Medical History  Diagnosis Date  . Hypertension   . DM (diabetes mellitus) (HCC)   . Renal disorder    Past Surgical History  Procedure Laterality Date  . Cholecystectomy N/A 03/25/2014    Procedure: LAPAROSCOPIC CHOLECYSTECTOMY WITH INTRAOPERATIVE CHOLANGIOGRAM;  Surgeon: Axel Filler, MD;  Location: [facility] OR;  Service: General;  Laterality: N/A;   Family History  Problem Relation Age of Onset  . Hypertension Mother   . Hypertension Father    Social History  Substance Use Topics  . Smoking status: Never Smoker   . Smokeless tobacco: None  . Alcohol Use: No   OB History    No data available     Review of Systems  All other systems reviewed and are negative.     Allergies  Aspirin; Codeine; and  Nsaids  Home Medications   Prior to Admission medications   Medication Sig Start Date End Date Taking? Authorizing Provider  acetaminophen (TYLENOL) 500 MG tablet Take 500 mg by mouth every 6 (six) hours as needed for mild pain.   Yes Historical Provider, MD  allopurinol (ZYLOPRIM) 100 MG tablet Take 1 tablet by mouth daily. 04/27/15  Yes Historical Provider, MD  ALPRAZolam Prudy Feeler) 0.25 MG tablet Take 0.25 mg by mouth 3 (three) times daily. 04/27/15 04/26/16 Yes Historical Provider, MD  ALPRAZolam Prudy Feeler) 0.5 MG tablet Take 1 tablet (0.5 mg total) by mouth 3 (three) times daily as needed for anxiety. 03/30/14  Yes Nishant Dhungel, MD  colestipol (COLESTID) 1 g tablet Take 1 tablet by mouth 2 (two) times daily. 05/01/15  Yes Historical Provider, MD  gabapentin (NEURONTIN) 300 MG capsule Take 300 mg by mouth at bedtime. 04/27/15  Yes Historical Provider, MD  hydrALAZINE (APRESOLINE) 25 MG tablet Take 1 tablet (25 mg total) by mouth every 8 (eight) hours. 03/30/14  Yes Nishant Dhungel, MD  metoprolol (LOPRESSOR) 100 MG tablet Take 100 mg by mouth 2 (two) times daily.   Yes Historical Provider, MD  ondansetron (ZOFRAN) 4 MG tablet Take 4 mg by mouth every 8 (eight) hours as needed for nausea or vomiting.   Yes Historical Provider, MD  sulfamethoxazole-trimethoprim (BACTRIM DS,SEPTRA DS) 800-160 MG per tablet Take 1 tablet by mouth 2 (two) times daily. For 5 additional days 04/05/14  Yes Costin Otelia Sergeant, MD  VITAMIN D, CHOLECALCIFEROL, PO Take 1 capsule by mouth daily.   Yes Historical Provider, MD  cyanocobalamin (,VITAMIN B-12,) 1000 MCG/ML injection Inject 1 mL (1,000 mcg total) into the muscle once a week. 04/11/14   Costin Otelia Sergeant, MD  docusate sodium 100 MG CAPS Take 100 mg by mouth 2 (two) times daily. 03/30/14   Nishant Dhungel, MD  feeding supplement, ENSURE COMPLETE, (ENSURE COMPLETE) LIQD Take 237 mLs by mouth 2 (two) times daily between meals. 03/30/14   Nishant Dhungel, MD  gabapentin (NEURONTIN) 100  MG capsule Take 1 capsule (100 mg total) by mouth 2 (two) times daily. 03/30/14   Nishant Dhungel, MD  HYDROmorphone (DILAUDID) 2 MG tablet Take 0.5-1 tablets (1-2 mg total) by mouth every 6 (six) hours as needed for severe pain. 04/05/14   Costin Otelia Sergeant, MD  polyethylene glycol (MIRALAX / GLYCOLAX) packet Take 17 g by mouth daily. 03/30/14   Nishant Dhungel, MD  traMADol (ULTRAM) 50 MG tablet Take 1 tablet (50 mg total) by mouth 3 (three) times daily as needed for moderate pain. 03/30/14   Nishant Dhungel, MD   BP 116/53 mmHg  Pulse 81  Temp(Src) 99.3 F (37.4 C) (Oral)  Resp 19  Ht 5\' 1"  (1.549 m)  Wt 159 lb (72.122 kg)  BMI 30.06 kg/m2  SpO2 96% Physical Exam  Constitutional: She is oriented to person, place, and time. She appears well-developed and well-nourished. No distress.  HENT:  Head: Normocephalic and atraumatic.  Right Ear: External ear normal.  Left Ear: External ear normal.  Eyes: Conjunctivae and EOM are normal. Pupils are equal, round, and reactive to light.  Neck: Normal range of motion and phonation normal. Neck supple.  Cardiovascular: Normal rate, regular rhythm and normal heart sounds.   Pulmonary/Chest: Effort normal and breath sounds normal. No respiratory distress. She exhibits no bony tenderness.  Abdominal: Soft. She exhibits no distension and no mass. There is tenderness (left upper and lower quadrants, mild). There is no rebound and no guarding.  Musculoskeletal: Normal range of motion.  Neurological: She is alert and oriented to person, place, and time. No cranial nerve deficit or sensory deficit. She exhibits normal muscle tone. Coordination normal.  Skin: Skin is warm, dry and intact.  Psychiatric: She has a normal mood and affect. Her behavior is normal. Judgment and thought content normal.  Nursing note and vitals reviewed.   ED Course  Procedures (including critical care time) Medications  0.9 %  sodium chloride infusion (not administered)  sodium  chloride 0.9 % bolus 500 mL (0 mLs Intravenous Stopped 05/08/15 2044)  iohexol (OMNIPAQUE) 300 MG/ML solution 80 mL (80 mLs Intravenous Contrast Given 05/08/15 2052)    Patient Vitals for the past 24 hrs:  BP Temp Temp src Pulse Resp SpO2 Height Weight  05/08/15 2130 (!) 116/53 mmHg - - 81 19 96 % - -  05/08/15 2115 124/99 mmHg - - 87 19 98 % - -  05/08/15 2000 (!) 115/54 mmHg - - 83 19 95 % - -  05/08/15 1945 116/56 mmHg - - 78 18 97 % - -  05/08/15 1930 111/59 mmHg - - 78 18 96 % - -  05/08/15 1915 126/61 mmHg - - 80 - 95 % - -  05/08/15 1900 126/63 mmHg - - 80 - 98 % - -  05/08/15 1837 138/62 mmHg 99.3 F (37.4 C) Oral - 22 98 % - -  05/08/15 1640 115/60 mmHg - - 75 18 96 % - -  05/08/15 1224 124/70 mmHg 100.4 F (38 C) Oral 79 18 95 %  (1.549 m) 159 lb (72.122 kg)    10:52 PM  Reevaluation with update and discussion. After initial assessment and treatment, an updated evaluation reveals she remains comfortable. She tolerated the oral contrast, without vomiting. Stool has been sent for pathogen testing. Findings discussed with patient and family, all questions answered. Anna Klein    Labs Review Labs Reviewed  COMPREHENSIVE METABOLIC PANEL - Abnormal; Notable for the following:    Glucose, Bld 113 (*)    BUN 23 (*)    Creatinine, Ser 1.60 (*)    Albumin 3.2 (*)    GFR calc non Af Amer 31 (*)    GFR calc Af Amer 36 (*)    All other components within normal limits  CBC WITH DIFFERENTIAL/PLATELET - Abnormal; Notable for the following:    Hemoglobin 11.7 (*)    HCT 34.9 (*)    Platelets 147 (*)    Lymphs Abs 0.5 (*)    All other components within normal limits  URINALYSIS, ROUTINE W REFLEX MICROSCOPIC (NOT AT [facility]) - Abnormal; Notable for the following:    Hgb urine dipstick TRACE (*)    Leukocytes, UA SMALL (*)    All other components within normal limits  URINE MICROSCOPIC-ADD ON - Abnormal; Notable for the following:    Squamous Epithelial / LPF 0-5 (*)     Bacteria, UA FEW (*)    All other components within normal limits  URINE CULTURE  GASTROINTESTINAL PANEL BY PCR, STOOL (REPLACES STOOL CULTURE)  I-STAT CG4 LACTIC ACID, ED  I-STAT CG4 LACTIC ACID, ED    Imaging Review Dg Chest 2 View  05/08/2015  CLINICAL DATA:  70 year old female with cough and shortness of breath EXAM: CHEST  2 VIEW COMPARISON:  Radiograph dated 03/31/2014 FINDINGS: Two views of the chest do not demonstrate a focal consolidation. There is no pleural effusion or pneumothorax. Stable cardiac silhouette. Retrocardiac air-containing structure most compatible with a hiatal hernia. - the aorta is tortuous. There is osteopenia with degenerative changes of the spine. Stable appearing compression deformity and anterior wedging of the low-grade thoracic spine. No acute fracture IMPRESSION: No active cardiopulmonary disease. Hiatal hernia. Electronically Signed   By: Elgie Collard M.D.   On: 05/08/2015 13:36   Ct Abdomen Pelvis W Contrast  05/08/2015  CLINICAL DATA:  Abdominal pain with 4 week history of diarrhea. EXAM: CT ABDOMEN AND PELVIS WITH CONTRAST TECHNIQUE: Multidetector CT imaging of the abdomen and pelvis was performed using the standard protocol following bolus administration of intravenous contrast. CONTRAST:  80mL OMNIPAQUE IOHEXOL 300 MG/ML  SOLN COMPARISON:  03/22/2014 FINDINGS: Lower chest:  Unremarkable. Hepatobiliary: No focal abnormality within the liver parenchyma. Gallbladder is surgically absent. No intrahepatic or extrahepatic biliary dilation. Pancreas: No focal mass lesion. No dilatation of the main duct. No intraparenchymal cyst. No peripancreatic edema. Spleen: No splenomegaly. No focal mass lesion. Adrenals/Urinary Tract: No adrenal nodule or mass. Right kidney is unremarkable. There is very subtle differential perfusion in the left kidney with areas of apparent segmental edema, best appreciated on renal delay series with narrow windows. No mass lesion or  hydronephrosis in either kidney. No evidence for hydroureter. The urinary bladder appears normal for the degree of distention. Stomach/Bowel: Moderate to large hiatal hernia contains 50-75% of the stomach. This is been incompletely visualized. Duodenum is normally positioned as is the ligament of Treitz. No small bowel wall thickening. No small bowel dilatation. The  terminal ileum is normal. The appendix is normal. Transverse colon tracks up towards the hiatal hernia. Colon otherwise unremarkable. Vascular/Lymphatic: There is abdominal aortic atherosclerosis without aneurysm. Portal vein and superior mesenteric vein are patent. There is no gastrohepatic or hepatoduodenal ligament lymphadenopathy. No intraperitoneal or retroperitoneal lymphadenopathy. No pelvic sidewall lymphadenopathy. Reproductive: Uterus is normal.  There is no adnexal mass. Other: No intraperitoneal free fluid. Musculoskeletal: Bone windows reveal no worrisome lytic or sclerotic osseous lesions. 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 Anterior wedge compression deformity at L1 is unchanged. IMPRESSION: 1. Subtle areas of segmental differential perfusion in the left kidney. Pyelonephritis can present with these imaging features. No evidence of hydronephrosis or obstructive uropathy. 2. Moderate to large hiatal hernia contains 50-75% of the stomach without CT features of obstruction. Transverse colon tracks up towards the hernia. 3. Abdominal aortic atherosclerosis. Electronically Signed   By: Kennith Center M.D.   On: 05/08/2015 22:01   I have personally reviewed and evaluated these images and lab results as part of my medical decision-making.   EKG Interpretation None      MDM   Final diagnoses:  Diarrhea, unspecified type  Bowel incontinence    Nonspecific diarrhea with bowel incontinence and mild abdominal pain. Evaluation is consistent with improving urinary tract infection, and possibly recent pyelonephritis. No  clear cause for diarrhea, or fecal incontinence. Doubt lumbar myelopathy. Doubt suspect he'll infection or metabolic instability.  Nursing Notes Reviewed/ Care Coordinated Applicable Imaging Reviewed Interpretation of Laboratory Data incorporated into ED treatment  The patient appears reasonably screened and/or stabilized for discharge and I doubt any other medical condition or other [facility] requiring further screening, evaluation, or treatment in the ED at this time prior to discharge.  Plan: Home Medications- usual; Home Treatments- Increase oral intake, supplement diet with nutritional shakes; return here if the recommended treatment, does not improve the symptoms; Recommended follow up- GI f/u 1 week     Mancel Bale, MD 05/08/15 2255

## 2015-05-08 NOTE — Discharge Instructions (Signed)
Chronic Diarrhea Diarrhea is frequent loose and watery bowel movements. It can cause you to feel weak and dehydrated. Dehydration can cause you to become tired and thirsty and to have a dry mouth, decreased urination, and dark yellow urine. Diarrhea is a sign of another problem, most often an infection that will not last long. In most cases, diarrhea lasts 2-3 days. Diarrhea that lasts longer than 4 weeks is called long-lasting (chronic) diarrhea. It is important to treat your diarrhea as directed by your health care provider to lessen or prevent future episodes of diarrhea.  CAUSES  There are many causes of chronic diarrhea. The following are some possible causes:   Gastrointestinal infections caused by viruses, bacteria, or parasites.   Food poisoning or food allergies.   Certain medicines, such as antibiotics, chemotherapy, and laxatives.   Artificial sweeteners and fructose.   Digestive disorders, such as celiac disease and inflammatory bowel diseases.   Irritable bowel syndrome.  Some disorders of the pancreas.  Disorders of the thyroid.  Reduced blood flow to the intestines.  Cancer. Sometimes the cause of chronic diarrhea is unknown. RISK FACTORS  Having a severely weakened immune system, such as from HIV or AIDS.   Taking certain types of cancer-fighting drugs (such as with chemotherapy) or other medicines.   Having had a recent organ transplant.   Having a portion of the stomach or small bowel removed.   Traveling to countries where food and water supplies are often contaminated.  SYMPTOMS  In addition to frequent, loose stools, diarrhea may cause:   Cramping.   Abdominal pain.   Nausea.   Fever.  Fatigue.  Urgent need to use the bathroom.  Loss of bowel control. DIAGNOSIS  Your health care provider must take a careful history and perform a physical exam. Tests given are based on your symptoms and history. Tests may include:   Blood or  stool tests. Three or more stool samples may be examined. Stool cultures may be used to test for bacteria or parasites.   X-rays.   A procedure in which a thin tube is inserted into the mouth or rectum (endoscopy). This allows the health care provider to look inside the intestine.  TREATMENT   Treatment is aimed at correcting the cause of the diarrhea when possible.  Diarrhea caused by an infection can often be treated with antibiotic medicines.  Diarrhea not caused by an infection may require you to take long-term medicine or have surgery. Specific treatment should be discussed with your health care provider.  If the cause cannot be determined, treatment aims to relieve symptoms and prevent dehydration. Serious health problems can occur if you do not maintain proper fluid levels. Treatment may include:  Taking an oral rehydration solution (ORS).  Not drinking beverages that contain caffeine (such as tea, coffee, and soft drinks).  Not drinking alcohol.  Maintaining well-balanced nutrition to help you recover faster. HOME CARE INSTRUCTIONS   Drink enough fluids to keep urine clear or pale yellow. Drink 1 cup (8 oz) of fluid for each diarrhea episode. Avoid fluids that contain simple sugars, fruit juices, whole milk products, and sodas. Hydrate with an ORS. You may purchase the ORS or prepare it at home by mixing the following ingredients together:   - tsp (1.7-3  mL) table salt.   tsp (3  mL) baking soda.   tsp (1.7 mL) salt substitute containing potassium chloride.  1 tbsp (20 mL) sugar.  4.2 c (1 L) of water.  Certain foods and beverages may increase the speed at which food moves through the gastrointestinal (GI) tract. These foods and beverages should be avoided. They include:  Caffeinated and alcoholic beverages.  High-fiber foods, such as raw fruits and vegetables, nuts, seeds, and whole grain breads and cereals.  Foods and beverages sweetened with sugar  alcohols, such as xylitol, sorbitol, and mannitol.   Some foods may be well tolerated and may help thicken stool. These include:  Starchy foods, such as rice, toast, pasta, low-sugar cereal, oatmeal, grits, baked potatoes, crackers, and bagels.  Bananas.  Applesauce.  Add probiotic-rich foods to help increase healthy bacteria in the GI tract. These include yogurt and fermented milk products.  Wash your hands well after each diarrhea episode.  Only take over-the-counter or prescription medicines as directed by your health care provider.  Take a warm bath to relieve any burning or pain from frequent diarrhea episodes. SEEK MEDICAL CARE IF:   You are not urinating as often.  Your urine is a dark color.  You become very tired or dizzy.  You have severe pain in the abdomen or rectum.  Your have blood or pus in your stools.  Your stools look black and tarry. SEEK IMMEDIATE MEDICAL CARE IF:   You are unable to keep fluids down.  You have persistent vomiting.  You have blood in your stool.  Your stools are black and tarry.  You do not urinate in 6-8 hours, or there is only a small amount of very dark urine.  You have abdominal pain that increases or localizes.  You have weakness, dizziness, confusion, or lightheadedness.  You have a severe headache.  Your diarrhea gets worse or does not get better.  You have a fever or persistent symptoms for more than 2-3 days.  You have a fever and your symptoms suddenly get worse. MAKE SURE YOU:   Understand these instructions.  Will watch your condition.  Will get help right away if you are not doing well or get worse.   This information is not intended to replace advice given to you by your health care provider. Make sure you discuss any questions you have with your health care provider.   Document Released: 05/25/2003 Document Revised: 03/09/2013 Document Reviewed: 08/27/2012 Elsevier Interactive Patient Education 2016  [employer]  Fecal Incontinence Fecal incontinence, also called accidental bowel leakage, is not being able to control your bowels. This condition happens because the nerves or muscles around the anus do not work the way they should. This affects their ability to hold stool. CAUSES  This condition may be caused by:  Damage to the muscles at the end of the rectum (sphincter).  Damage to the nerves that control bowel movements.  Diarrhea.  Chronic constipation.  Pelvic floor dysfunction. This means the muscles in the pelvis do not work well.  Loss of bowel storage capacity. RISK FACTORS This condition is more likely to develop in people who:   Are born with bowels or a pelvis that has not formed correctly.  Have had rectal surgery.  Have had radiation treatment for certain cancers.  Have irritable bowel syndrome (IBS).  Have an inflammatory bowel disease (IBD), such as Crohn disease.  Have been pregnant, had a vaginal delivery, or had surgery that damaged the pelvic floor muscles.  Have a complicated childbirth, spinal cord injury, or other trauma that causes nerve damage.  Have a condition that can affect nerve function, such as diabetes, Parkinson disease, or multiple sclerosis.  Have a condition where the rectum drops down into the anus or vagina (prolapse).  Are older. SYMPTOMS  The main symptom of this condition is not being able to control your bowels. You also might not be able get to the bathroom before a bowel movement. DIAGNOSIS  This condition is diagnosed with a medical history and physical exam. You may also have tests, including:   Blood tests.  Urine tests.  A rectal exam.  Ultrasound.  MRI.  Colonoscopy. This is an exam that looks at your large intestine (colon).  Anal manometry. This is a test that measures the strength of the anal sphincter.  Anal electromyogram (EMG). This is a test that uses small electrodes to check for nerve  damage. TREATMENT  Treatment varies depending on the cause and severity of your condition. Treatment may also focus on addressing any underlying causes of this condition. Treatment may include:  Medicines. This may include medicines to:  Prevent diarrhea.  Help with constipation (laxatives).  Treat any underlying conditions.  Physical therapy.  Fiber supplements. These can help manage your bowel movements.  Nerve stimulation.  Injectable gel to promote tissue growth and better muscle control.  Surgery. You may need:  Sphincter repair surgery.  Diversion surgery. This procedure lets feces pass out of your body through a hole in your abdomen. HOME CARE INSTRUCTIONS  Diet  Follow instructions from your health care provider about any eating or drinking restrictions. Work with a dietitian to come up with a healthy diet and to help you avoid the foods that can make your condition worse. Keep a diet diary to find out which foods or drinks could be making your fecal incontinence worse.  Drink enough fluid to keep your urine clear or pale yellow. Lifestyle  If you smoke, talk to your health care provider about quitting. This may help your condition.  If you are overweight, talk to your health care provider about how to safely lose weight. This may help your condition.  Increase your physical activity as told by your health care provider. This may help your condition. Always talk to your health care provider before starting a new exercise program.  Carry a change of clothes and supplies to clean up quickly if you have an episode of fetal incontinence.  Consider joining a fecal incontinence support group. You can find a support group online or in your local community. General Instructions  Take over-the-counter and prescription medicines only as told by your health care provider. This includes any supplements.  Apply a moisture barrier, such as petroleum jelly, to your rectum. This  protects the skin from irritation caused by ongoing leaking or diarrhea.  Tell your health care provider if you are upset or depressed about your condition.  SEEK MEDICAL CARE IF:   You have a fever.  You have redness, swelling, or pain around your rectum.  Your pain is getting worse or you lose feeling in your rectal area.  You have blood in your stool.  You feel sad or hopeless.  You avoid social or work situations. SEEK IMMEDIATE MEDICAL CARE IF:   You stop having bowel movements.  You cannot eat or drink without vomiting.  You have rectal bleeding that does not stop.  You have severe pain that is getting worse.  You have symptoms of dehydration, including:  Sleepiness or fatigue.  Producing little or no urine, tears, or sweat.  Dizziness.  Dry mouth.  Unusual irritability.  Headache.  Inability to think clearly.  FOR MORE INFORMATION  [employer]: www.https://powers.com/ [employer]: www.iffgd.org   This information is not intended to replace advice given to you by your health care provider. Make sure you discuss any questions you have with your health care provider.   Document Released: 02/14/2004 Document Revised: 11/23/2014 Document Reviewed: 08/10/2014 Elsevier Interactive Patient Education [employer]

## 2015-05-08 NOTE — ED Notes (Signed)
EDP at bedside  

## 2015-05-09 ENCOUNTER — Encounter: Payer: Self-pay | Admitting: Internal Medicine

## 2015-05-09 LAB — GASTROINTESTINAL PANEL BY PCR, STOOL (REPLACES STOOL CULTURE)
ADENOVIRUS F40/41: NOT DETECTED
ASTROVIRUS: NOT DETECTED
CRYPTOSPORIDIUM: NOT DETECTED
CYCLOSPORA CAYETANENSIS: NOT DETECTED
Campylobacter species: NOT DETECTED
E. coli O157: NOT DETECTED
ENTAMOEBA HISTOLYTICA: NOT DETECTED
ENTEROPATHOGENIC E COLI (EPEC): NOT DETECTED
Enteroaggregative E coli (EAEC): NOT DETECTED
Enterotoxigenic E coli (ETEC): NOT DETECTED
GIARDIA LAMBLIA: NOT DETECTED
Norovirus GI/GII: NOT DETECTED
Plesimonas shigelloides: NOT DETECTED
ROTAVIRUS A: NOT DETECTED
SHIGA LIKE TOXIN PRODUCING E COLI (STEC): NOT DETECTED
Salmonella species: NOT DETECTED
Sapovirus (I, II, IV, and V): NOT DETECTED
Shigella/Enteroinvasive E coli (EIEC): NOT DETECTED
VIBRIO CHOLERAE: NOT DETECTED
VIBRIO SPECIES: NOT DETECTED
YERSINIA ENTEROCOLITICA: NOT DETECTED

## 2015-05-11 LAB — URINE CULTURE

## 2015-05-12 ENCOUNTER — Telehealth (HOSPITAL_COMMUNITY): Payer: Self-pay

## 2015-05-12 NOTE — Telephone Encounter (Signed)
Post ED Visit - Positive Culture Follow-up: Successful Patient Follow-Up  Culture assessed and recommendations reviewed by:  Enzo Bi, Pharm.D.  Celedonio Miyamoto, Pharm.D., BCPS  Garvin Fila, Pharm.D.  Georgina Pillion, Pharm.D., BCPS  [place], [address].D., BCPS, AAHIVP  Estella Husk, Pharm.D., BCPS, AAHIVP  Tennis Must, Pharm.D.  Rob Oswaldo Done, [address].D.  Positive urine culture, 30,000 colonies -> enterococcus species   Patient discharged without antimicrobial prescription and treatment is now indicated  Organism is resistant to prescribed ED discharge antimicrobial (Bactrim)  Patient with positive blood cultures  Changes discussed with ED provider: K. Rose [place] New antibiotic prescription Amoxicillin 500 mg po q 12 x 7 days 05/12/2015 @ 11:55 Called to [place] [phone] and given to RPh  Contacted patient, date 05/12/2015, time 11:51 Pt informed of dx and need for addl tx.   Arvid Right 05/12/2015, 11:49 AM

## 2015-05-12 NOTE — Progress Notes (Signed)
ED Antimicrobial Stewardship Positive Culture Follow Up   Anna Klein is an 70 y.o. female who presented to [facility] on 05/08/2015 with a chief complaint of  Chief Complaint  Patient presents with  . Weakness    Recent Results (from the past 720 hour(s))  Urine culture     Status: None   Collection Time: 05/08/15  6:34 PM  Result Value Ref Range Status   Specimen Description URINE, CLEAN CATCH  Final   Special Requests NONE  Final   Culture 30,000 COLONIES/mL ENTEROCOCCUS SPECIES  Final   Report Status 05/11/2015 FINAL  Final   Organism ID, Bacteria ENTEROCOCCUS SPECIES  Final      Susceptibility   Enterococcus species - MIC*    AMPICILLIN <=2 SENSITIVE Sensitive     LEVOFLOXACIN 1 SENSITIVE Sensitive     NITROFURANTOIN <=16 SENSITIVE Sensitive     VANCOMYCIN 1 SENSITIVE Sensitive     * 30,000 COLONIES/mL ENTEROCOCCUS SPECIES  Gastrointestinal Panel by PCR , Stool     Status: None   Collection Time: 05/08/15 10:40 PM  Result Value Ref Range Status   Campylobacter species NOT DETECTED NOT DETECTED Final   Plesimonas shigelloides NOT DETECTED NOT DETECTED Final   Salmonella species NOT DETECTED NOT DETECTED Final   Yersinia enterocolitica NOT DETECTED NOT DETECTED Final   Vibrio species NOT DETECTED NOT DETECTED Final   Vibrio cholerae NOT DETECTED NOT DETECTED Final   Enteroaggregative E coli (EAEC) NOT DETECTED NOT DETECTED Final   Enteropathogenic E coli (EPEC) NOT DETECTED NOT DETECTED Final   Enterotoxigenic E coli (ETEC) NOT DETECTED NOT DETECTED Final   Shiga like toxin producing E coli (STEC) NOT DETECTED NOT DETECTED Final   E. coli O157 NOT DETECTED NOT DETECTED Final   Shigella/Enteroinvasive E coli (EIEC) NOT DETECTED NOT DETECTED Final   Cryptosporidium NOT DETECTED NOT DETECTED Final   Cyclospora cayetanensis NOT DETECTED NOT DETECTED Final   Entamoeba histolytica NOT DETECTED NOT DETECTED Final   Giardia lamblia NOT DETECTED NOT DETECTED Final   Adenovirus  F40/41 NOT DETECTED NOT DETECTED Final   Astrovirus NOT DETECTED NOT DETECTED Final   Norovirus GI/GII NOT DETECTED NOT DETECTED Final   Rotavirus A NOT DETECTED NOT DETECTED Final   Sapovirus (I, II, IV, and V) NOT DETECTED NOT DETECTED Final     Treated with Bactrim, organism is not covered by antibiotic   New antibiotic prescription: Stop Bactrim if still taking Start amoxicillin  PO BID x 7 days  ED Provider: Cheri Fowler PA-C   Anna Klein 05/12/2015, 9:07 AM Infectious Diseases Pharmacist Phone# [phone]

## 2015-06-27 ENCOUNTER — Ambulatory Visit: Payer: Medicare Other | Admitting: Internal Medicine

## 2015-10-05 IMAGING — NM NM HEPATOBILIARY IMAGE, INC GB
2 series · 12 of 12 positions shown · non-contrast
Comparison: CT scan of the abdomen and pelvis of March 22, 2014,
and supine abdominal film of today's date.

CLINICAL DATA: Status post cholecystectomy 4 days ago, abdominal
pain

EXAM:
NUCLEAR MEDICINE HEPATOBILIARY IMAGING
TECHNIQUE: Sequential images of the abdomen were obtained [DATE] minutes
following intravenous administration of radiopharmaceutical.
RADIOPHARMACEUTICALS:  [employer] Wc-66m Choletec

[he hepatobiliary · 3.43mm/px · 6 of 60 frames shown (1 of 2)]
[frame 6/60]
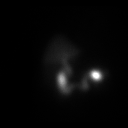
[frame 16/60]
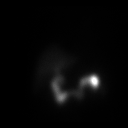
[frame 26/60]
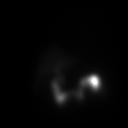
[frame 36/60]
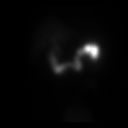
[frame 46/60]
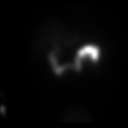
[frame 56/60]
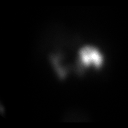

[he hepatobiliary · 3.43mm/px · 6 of 60 frames shown (2 of 2)]
[frame 6/60]
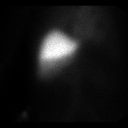
[frame 16/60]
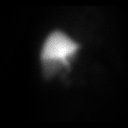
[frame 26/60]
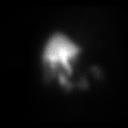
[frame 36/60]
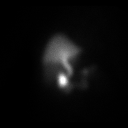
[frame 46/60]
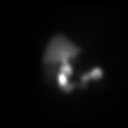
[frame 56/60]
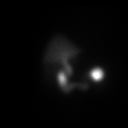

[12 of 12 positions shown; findings below may reference images not displayed]

FINDINGS: There is adequate uptake of the radiopharmaceutical by the liver.
Activity is visible in the common bile duct and bowel by 20 min.
There is transient low-level activity over the inferior aspect of
the right hepatic lobe on the 25 and 30 min images which is never
again visualized [DATE] min. This is due to the patient
intermittently moving her arm contained injection site over the
field-of-view. There is no increasing accumulation of activity
outside of bowel to suggest a bile leak.
IMPRESSION: There are no findings suspicious for a bile leak. There is no common
bile duct obstruction.

## 2015-10-05 IMAGING — CR DG ABDOMEN 1V
1 series · 1 of 1 positions shown · non-contrast
Comparison: CT of the abdomen and pelvis from 03/22/2014

CLINICAL DATA: Acute onset of generalized abdominal pain and back
pain. Initial encounter.

EXAM:
ABDOMEN - 1 VIEW

[abdomen supine]
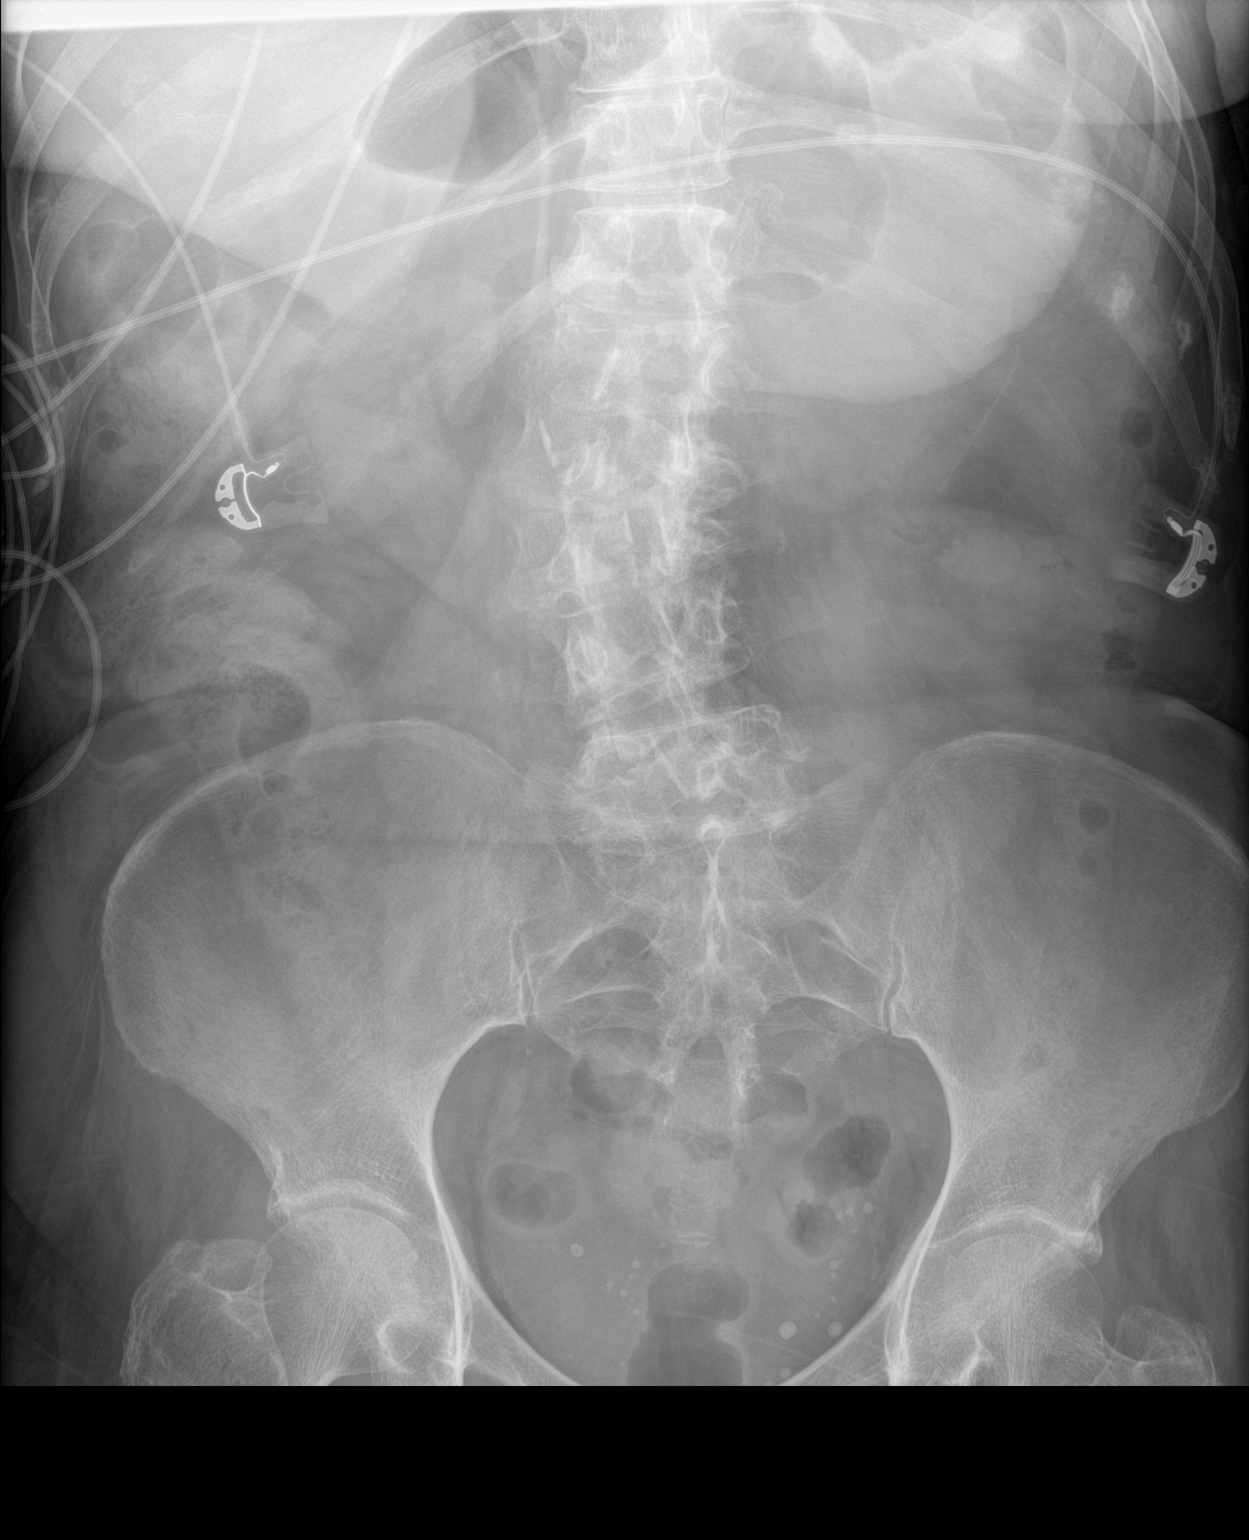

[1 of 1 positions shown; findings below may reference images not displayed]

FINDINGS: The visualized bowel gas pattern is unremarkable. Scattered air and
contrast filled loops of colon are seen; no abnormal dilatation of
small bowel loops is seen to suggest small bowel obstruction. No
free intra-abdominal air is identified, though evaluation for free
air is limited on a single supine view.

There is mild rotatory scoliosis along the lumbar spine, with
associated degenerative change. The sacroiliac joints are
unremarkable.
IMPRESSION: 1. Unremarkable bowel gas pattern; no free intra-abdominal air seen.
2. Mild rotatory scoliosis along the lumbar spine, with associated
degenerative change.

## 2023-05-31 ENCOUNTER — Inpatient Hospital Stay (HOSPITAL_COMMUNITY)
Admission: EM | Admit: 2023-05-31 | Discharge: 2023-06-03 | DRG: 281 | Disposition: A | Discharge status: Home / Self Care | Attending: Internal Medicine | Admitting: Internal Medicine

## 2023-05-31 ENCOUNTER — Emergency Department (HOSPITAL_COMMUNITY)

## 2023-05-31 ENCOUNTER — Other Ambulatory Visit: Payer: Self-pay

## 2023-05-31 ENCOUNTER — Encounter (HOSPITAL_COMMUNITY): Payer: Self-pay

## 2023-05-31 DIAGNOSIS — E1165 Type 2 diabetes mellitus with hyperglycemia: Secondary | ICD-10-CM | POA: Diagnosis present

## 2023-05-31 DIAGNOSIS — Z7982 Long term (current) use of aspirin: Secondary | ICD-10-CM

## 2023-05-31 DIAGNOSIS — R1010 Upper abdominal pain, unspecified: Secondary | ICD-10-CM | POA: Diagnosis not present

## 2023-05-31 DIAGNOSIS — I2510 Atherosclerotic heart disease of native coronary artery without angina pectoris: Secondary | ICD-10-CM | POA: Diagnosis present

## 2023-05-31 DIAGNOSIS — I447 Left bundle-branch block, unspecified: Secondary | ICD-10-CM | POA: Diagnosis present

## 2023-05-31 DIAGNOSIS — E872 Acidosis, unspecified: Secondary | ICD-10-CM | POA: Diagnosis present

## 2023-05-31 DIAGNOSIS — H3530 Unspecified macular degeneration: Secondary | ICD-10-CM | POA: Diagnosis present

## 2023-05-31 DIAGNOSIS — Z8249 Family history of ischemic heart disease and other diseases of the circulatory system: Secondary | ICD-10-CM

## 2023-05-31 DIAGNOSIS — I10 Essential (primary) hypertension: Secondary | ICD-10-CM | POA: Diagnosis present

## 2023-05-31 DIAGNOSIS — E1122 Type 2 diabetes mellitus with diabetic chronic kidney disease: Secondary | ICD-10-CM | POA: Diagnosis present

## 2023-05-31 DIAGNOSIS — I5020 Unspecified systolic (congestive) heart failure: Secondary | ICD-10-CM

## 2023-05-31 DIAGNOSIS — I214 Non-ST elevation (NSTEMI) myocardial infarction: Secondary | ICD-10-CM | POA: Diagnosis not present

## 2023-05-31 DIAGNOSIS — Z885 Allergy status to narcotic agent status: Secondary | ICD-10-CM

## 2023-05-31 DIAGNOSIS — Z8673 Personal history of transient ischemic attack (TIA), and cerebral infarction without residual deficits: Secondary | ICD-10-CM

## 2023-05-31 DIAGNOSIS — I130 Hypertensive heart and chronic kidney disease with heart failure and stage 1 through stage 4 chronic kidney disease, or unspecified chronic kidney disease: Secondary | ICD-10-CM | POA: Diagnosis present

## 2023-05-31 DIAGNOSIS — Z886 Allergy status to analgesic agent status: Secondary | ICD-10-CM

## 2023-05-31 DIAGNOSIS — N1832 Chronic kidney disease, stage 3b: Secondary | ICD-10-CM | POA: Diagnosis present

## 2023-05-31 DIAGNOSIS — Z7902 Long term (current) use of antithrombotics/antiplatelets: Secondary | ICD-10-CM

## 2023-05-31 DIAGNOSIS — I428 Other cardiomyopathies: Secondary | ICD-10-CM | POA: Diagnosis present

## 2023-05-31 DIAGNOSIS — I5032 Chronic diastolic (congestive) heart failure: Secondary | ICD-10-CM | POA: Diagnosis present

## 2023-05-31 DIAGNOSIS — E876 Hypokalemia: Secondary | ICD-10-CM | POA: Diagnosis present

## 2023-05-31 DIAGNOSIS — I5022 Chronic systolic (congestive) heart failure: Secondary | ICD-10-CM

## 2023-05-31 DIAGNOSIS — Z9049 Acquired absence of other specified parts of digestive tract: Secondary | ICD-10-CM

## 2023-05-31 DIAGNOSIS — I5042 Chronic combined systolic (congestive) and diastolic (congestive) heart failure: Secondary | ICD-10-CM | POA: Diagnosis present

## 2023-05-31 DIAGNOSIS — Z1152 Encounter for screening for COVID-19: Secondary | ICD-10-CM

## 2023-05-31 DIAGNOSIS — E119 Type 2 diabetes mellitus without complications: Secondary | ICD-10-CM

## 2023-05-31 DIAGNOSIS — Z7984 Long term (current) use of oral hypoglycemic drugs: Secondary | ICD-10-CM

## 2023-05-31 DIAGNOSIS — E785 Hyperlipidemia, unspecified: Secondary | ICD-10-CM | POA: Diagnosis present

## 2023-05-31 DIAGNOSIS — Z79899 Other long term (current) drug therapy: Secondary | ICD-10-CM

## 2023-05-31 LAB — I-STAT CHEM 8, ED
BUN: 13 mg/dL (ref 8–23)
Calcium, Ion: 1.02 mmol/L — ABNORMAL LOW (ref 1.15–1.40)
Chloride: 108 mmol/L (ref 98–111)
Creatinine, Ser: 1.3 mg/dL — ABNORMAL HIGH (ref 0.44–1.00)
Glucose, Bld: 160 mg/dL — ABNORMAL HIGH (ref 70–99)
HCT: 41 % (ref 36.0–46.0)
Hemoglobin: 13.9 g/dL (ref 12.0–15.0)
Potassium: 3 mmol/L — ABNORMAL LOW (ref 3.5–5.1)
Sodium: 142 mmol/L (ref 135–145)
TCO2: 22 mmol/L (ref 22–32)

## 2023-05-31 LAB — COMPREHENSIVE METABOLIC PANEL
ALT: 16 U/L (ref 0–44)
AST: 22 U/L (ref 15–41)
Albumin: 4.1 g/dL (ref 3.5–5.0)
Alkaline Phosphatase: 79 U/L (ref 38–126)
Anion gap: 17 — ABNORMAL HIGH (ref 5–15)
BUN: 15 mg/dL (ref 8–23)
CO2: 21 mmol/L — ABNORMAL LOW (ref 22–32)
Calcium: 9.9 mg/dL (ref 8.9–10.3)
Chloride: 104 mmol/L (ref 98–111)
Creatinine, Ser: 1.34 mg/dL — ABNORMAL HIGH (ref 0.44–1.00)
GFR, Estimated: 40 mL/min — ABNORMAL LOW (ref >60)
Glucose, Bld: 168 mg/dL — ABNORMAL HIGH (ref 70–99)
Potassium: 3.1 mmol/L — ABNORMAL LOW (ref 3.5–5.1)
Sodium: 142 mmol/L (ref 135–145)
Total Bilirubin: 0.8 mg/dL (ref 0.0–1.2)
Total Protein: 6.8 g/dL (ref 6.5–8.1)

## 2023-05-31 LAB — CBC WITH DIFFERENTIAL/PLATELET
Abs Immature Granulocytes: 0.02 10*3/uL (ref 0.00–0.07)
Basophils Absolute: 0 10*3/uL (ref 0.0–0.1)
Basophils Relative: 0 %
Eosinophils Absolute: 0.1 10*3/uL (ref 0.0–0.5)
Eosinophils Relative: 1 %
HCT: 41.7 % (ref 36.0–46.0)
Hemoglobin: 13.5 g/dL (ref 12.0–15.0)
Immature Granulocytes: 0 %
Lymphocytes Relative: 18 %
Lymphs Abs: 1.5 10*3/uL (ref 0.7–4.0)
MCH: 29 pg (ref 26.0–34.0)
MCHC: 32.4 g/dL (ref 30.0–36.0)
MCV: 89.7 fL (ref 80.0–100.0)
Monocytes Absolute: 0.5 10*3/uL (ref 0.1–1.0)
Monocytes Relative: 6 %
Neutro Abs: 6.1 10*3/uL (ref 1.7–7.7)
Neutrophils Relative %: 75 %
Platelets: 159 10*3/uL (ref 150–400)
RBC: 4.65 MIL/uL (ref 3.87–5.11)
RDW: 14.3 % (ref 11.5–15.5)
WBC: 8.2 10*3/uL (ref 4.0–10.5)
nRBC: 0 % (ref 0.0–0.2)

## 2023-05-31 LAB — TROPONIN I (HIGH SENSITIVITY)
Troponin I (High Sensitivity): 40 ng/L — ABNORMAL HIGH (ref <18)
Troponin I (High Sensitivity): 638 ng/L (ref <18)

## 2023-05-31 LAB — RESP PANEL BY RT-PCR (RSV, FLU A&B, COVID)  RVPGX2
Influenza A by PCR: NEGATIVE
Influenza B by PCR: NEGATIVE
Resp Syncytial Virus by PCR: NEGATIVE
SARS Coronavirus 2 by RT PCR: NEGATIVE

## 2023-05-31 LAB — LIPASE, BLOOD: Lipase: 66 U/L — ABNORMAL HIGH (ref 11–51)

## 2023-05-31 MED ORDER — NITROGLYCERIN 0.4 MG SL SUBL
0.4000 mg | SUBLINGUAL_TABLET | SUBLINGUAL | Status: DC | PRN
  Administered 2023-06-01 (×2): 0.4 mg via SUBLINGUAL
  Filled 2023-05-31: qty 1

## 2023-05-31 MED ORDER — IOHEXOL 350 MG/ML SOLN
75.0000 mL | Freq: Once | INTRAVENOUS | Status: AC | PRN
  Administered 2023-05-31: 75 mL via INTRAVENOUS

## 2023-05-31 MED ORDER — FENTANYL CITRATE PF 50 MCG/ML IJ SOSY
50.0000 ug | PREFILLED_SYRINGE | Freq: Once | INTRAMUSCULAR | Status: AC
  Administered 2023-05-31: 50 ug via INTRAVENOUS
  Filled 2023-05-31: qty 1

## 2023-05-31 MED ORDER — ASPIRIN 81 MG PO CHEW
324.0000 mg | CHEWABLE_TABLET | Freq: Once | ORAL | Status: AC
  Administered 2023-05-31: 324 mg via ORAL
  Filled 2023-05-31: qty 4

## 2023-05-31 MED ORDER — LATANOPROST 0.005 % OP SOLN
1.0000 [drp] | Freq: Every day | OPHTHALMIC | Status: DC
  Administered 2023-06-01 – 2023-06-02 (×2): 1 [drp] via OPHTHALMIC
  Filled 2023-05-31: qty 2.5

## 2023-05-31 MED ORDER — ASPIRIN 81 MG PO TBEC
81.0000 mg | DELAYED_RELEASE_TABLET | Freq: Every day | ORAL | Status: DC
  Administered 2023-06-01 – 2023-06-03 (×2): 81 mg via ORAL
  Filled 2023-05-31 (×2): qty 1

## 2023-05-31 MED ORDER — POTASSIUM CHLORIDE CRYS ER 20 MEQ PO TBCR
40.0000 meq | EXTENDED_RELEASE_TABLET | Freq: Once | ORAL | Status: AC
  Administered 2023-05-31: 40 meq via ORAL
  Filled 2023-05-31: qty 2

## 2023-05-31 MED ORDER — PANTOPRAZOLE SODIUM 40 MG IV SOLR
40.0000 mg | Freq: Once | INTRAVENOUS | Status: AC
  Administered 2023-05-31: 40 mg via INTRAVENOUS
  Filled 2023-05-31: qty 10

## 2023-05-31 MED ORDER — ACETAMINOPHEN 500 MG PO TABS
1000.0000 mg | ORAL_TABLET | Freq: Four times a day (QID) | ORAL | Status: DC | PRN
  Administered 2023-06-02: 1000 mg via ORAL
  Filled 2023-05-31 (×2): qty 2

## 2023-05-31 MED ORDER — HEPARIN (PORCINE) 25000 UT/250ML-% IV SOLN
1000.0000 [IU]/h | INTRAVENOUS | Status: DC
  Administered 2023-05-31: 750 [IU]/h via INTRAVENOUS
  Administered 2023-06-01: 1000 [IU]/h via INTRAVENOUS
  Filled 2023-05-31 (×2): qty 250

## 2023-05-31 MED ORDER — HEPARIN BOLUS VIA INFUSION
3500.0000 [IU] | Freq: Once | INTRAVENOUS | Status: AC
Start: 2023-05-31 — End: 2023-05-31
  Administered 2023-05-31: 3500 [IU] via INTRAVENOUS
  Filled 2023-05-31: qty 3500

## 2023-05-31 MED ORDER — DORZOLAMIDE HCL-TIMOLOL MAL 2-0.5 % OP SOLN
1.0000 [drp] | Freq: Two times a day (BID) | OPHTHALMIC | Status: DC
  Administered 2023-06-01 – 2023-06-02 (×2): 1 [drp] via OPHTHALMIC
  Filled 2023-05-31: qty 10

## 2023-05-31 MED ORDER — CLOPIDOGREL BISULFATE 75 MG PO TABS: 75.0000 mg | ORAL_TABLET | Freq: Every day | ORAL | Status: DC

## 2023-05-31 MED ORDER — METOPROLOL TARTRATE 25 MG PO TABS
25.0000 mg | ORAL_TABLET | Freq: Two times a day (BID) | ORAL | Status: DC
  Administered 2023-05-31 – 2023-06-02 (×5): 25 mg via ORAL
  Filled 2023-05-31 (×6): qty 1

## 2023-05-31 MED ORDER — ATORVASTATIN CALCIUM 80 MG PO TABS
80.0000 mg | ORAL_TABLET | Freq: Every day | ORAL | Status: DC
  Administered 2023-05-31 – 2023-06-02 (×3): 80 mg via ORAL
  Filled 2023-05-31 (×2): qty 1
  Filled 2023-05-31: qty 2

## 2023-05-31 MED ORDER — ONDANSETRON HCL 4 MG/2ML IJ SOLN: 4.0000 mg | Freq: Four times a day (QID) | INTRAMUSCULAR | Status: DC | PRN

## 2023-05-31 NOTE — ED Notes (Signed)
 PT was able to ambulate to the bathroom and back without any difficulty.

## 2023-05-31 NOTE — ED Provider Notes (Signed)
 [place] EMERGENCY DEPARTMENT AT [facility] Provider Note   CSN: [id number] Arrival date & time: 05/31/23  1624     History {Add pertinent medical, surgical, social history, OB history to HPI:1} Chief Complaint  Patient presents with  . Abdominal Pain    Anna Klein is a 78 y.o. female.  The history is provided by the patient, the EMS personnel and medical records.  Abdominal Pain Anna Klein is a 78 y.o. female who presents to the Emergency Department complaining of abdominal pain.  She presents to the emergency department by EMS for evaluation of severe and sudden onset upper abdominal pain, chest pain and back pain.  She has bodyaches as well.  Pain started right after eating spaghetti.  She did have numerous episodes of emesis related to this.  She received 4 mg of ondansetron by EMS prior to ED arrival.   She has a past medical history of hypertension, diabetes, reflux, hyperlipidemia, CHF  Home Medications Prior to Admission medications   Medication Sig Start Date End Date Taking? Authorizing Provider  acetaminophen (TYLENOL) 500 MG tablet Take 500 mg by mouth every 6 (six) hours as needed for mild pain.    [provider]  allopurinol (ZYLOPRIM) 100 MG tablet Take 1 tablet by mouth daily. 04/27/15   [provider]  ALPRAZolam Prudy Feeler) 0.5 MG tablet Take 1 tablet (0.5 mg total) by mouth 3 (three) times daily as needed for anxiety. 03/30/14   Dhungel, Theda Belfast, MD  colestipol (COLESTID) 1 g tablet Take 1 tablet by mouth 2 (two) times daily. 05/01/15   [provider]  cyanocobalamin (,VITAMIN B-12,) 1000 MCG/ML injection Inject 1 mL (1,000 mcg total) into the muscle once a week. 04/11/14   Leatha Gilding, MD  docusate sodium 100 MG CAPS Take 100 mg by mouth 2 (two) times daily. 03/30/14   Dhungel, Theda Belfast, MD  feeding supplement, ENSURE COMPLETE, (ENSURE COMPLETE) LIQD Take 237 mLs by mouth 2 (two) times daily between meals. 03/30/14    Dhungel, Theda Belfast, MD  gabapentin (NEURONTIN) 100 MG capsule Take 1 capsule (100 mg total) by mouth 2 (two) times daily. 03/30/14   Dhungel, Theda Belfast, MD  gabapentin (NEURONTIN) 300 MG capsule Take 300 mg by mouth at bedtime. 04/27/15   [provider]  hydrALAZINE (APRESOLINE) 25 MG tablet Take 1 tablet (25 mg total) by mouth every 8 (eight) hours. 03/30/14   Dhungel, Theda Belfast, MD  HYDROmorphone (DILAUDID) 2 MG tablet Take 0.5-1 tablets (1-2 mg total) by mouth every 6 (six) hours as needed for severe pain. 04/05/14   Leatha Gilding, MD  metoprolol (LOPRESSOR) 100 MG tablet Take 100 mg by mouth 2 (two) times daily.    [provider]  ondansetron (ZOFRAN) 4 MG tablet Take 4 mg by mouth every 8 (eight) hours as needed for nausea or vomiting.    [provider]  polyethylene glycol (MIRALAX / GLYCOLAX) packet Take 17 g by mouth daily. 03/30/14   Dhungel, Theda Belfast, MD  sulfamethoxazole-trimethoprim (BACTRIM DS,SEPTRA DS) 800-160 MG per tablet Take 1 tablet by mouth 2 (two) times daily. For 5 additional days 04/05/14   Leatha Gilding, MD  traMADol (ULTRAM) 50 MG tablet Take 1 tablet (50 mg total) by mouth 3 (three) times daily as needed for moderate pain. 03/30/14   Dhungel, Nishant, MD  VITAMIN D, CHOLECALCIFEROL, PO Take 1 capsule by mouth daily.    [provider]      Allergies    Aspirin, Codeine, and  Nsaids    Review of Systems   Review of Systems  Gastrointestinal:  Positive for abdominal pain.  All other systems reviewed and are negative.   Physical Exam Updated Vital Signs BP (!) 122/92   Pulse 100   Temp 98.4 F (36.9 C) (Oral)   Resp (!) 21   SpO2 100%  Physical Exam Vitals and nursing note reviewed.  Constitutional:      Appearance: She is well-developed.     Comments: Appears uncomfortable  HENT:     Head: Normocephalic and atraumatic.  Cardiovascular:     Rate and Rhythm: Regular rhythm. Tachycardia present.     Heart sounds: No murmur  heard. Pulmonary:     Effort: Pulmonary effort is normal. No respiratory distress.     Breath sounds: Normal breath sounds.  Abdominal:     Palpations: Abdomen is soft.     Tenderness: There is no guarding or rebound.     Comments: Mild generalized abdominal tenderness  Musculoskeletal:        General: No tenderness.  Skin:    General: Skin is warm and dry.  Neurological:     Mental Status: She is alert and oriented to person, place, and time.  Psychiatric:        Behavior: Behavior normal.    ED Results / Procedures / Treatments   Labs (all labs ordered are listed, but only abnormal results are displayed) Labs Reviewed  I-STAT CHEM 8, ED - Abnormal; Notable for the following components:      Result Value   Potassium 3.0 (*)    Creatinine, Ser 1.30 (*)    Glucose, Bld 160 (*)    Calcium, Ion 1.02 (*)    All other components within normal limits  RESP PANEL BY RT-PCR (RSV, FLU A&B, COVID)  RVPGX2  COMPREHENSIVE METABOLIC PANEL  CBC WITH DIFFERENTIAL/PLATELET  LIPASE, BLOOD  TROPONIN I (HIGH SENSITIVITY)    EKG None  Radiology No results found.  Procedures Procedures  {Document cardiac monitor, telemetry assessment procedure when appropriate:1}  Medications Ordered in ED Medications  fentaNYL (SUBLIMAZE) injection 50 mcg (50 mcg Intravenous Given 05/31/23 1639)    ED Course/ Medical Decision Making/ A&P   {   Click here for ABCD2, HEART and other calculatorsREFRESH Note before signing :1}                              Medical Decision Making Amount and/or Complexity of Data Reviewed Labs: ordered. Radiology: ordered.  Risk Prescription drug management.   ***  {Document critical care time when appropriate:1} {Document review of labs and clinical decision tools ie heart score, Chads2Vasc2 etc:1}  {Document your independent review of radiology images, and any outside records:1} {Document your discussion with family members, caretakers, and with  consultants:1} {Document social determinants of health affecting pt's care:1} {Document your decision making why or why not admission, treatments were needed:1} Final Clinical Impression(s) / ED Diagnoses Final diagnoses:  None    Rx / DC Orders ED Discharge Orders     None

## 2023-05-31 NOTE — ED Notes (Signed)
 Patient transported to CT

## 2023-05-31 NOTE — Progress Notes (Signed)
 ANTICOAGULATION CONSULT NOTE  Pharmacy Consult for Heparin Indication: chest pain/ACS  Allergies  Allergen Reactions   Aspirin Other (See Comments)    Due to kidney function   Codeine    Nsaids Other (See Comments)    Cannot take due to kidney function.    Patient Measurements:   Heparin Dosing Weight: 63.7 kg  Vital Signs: Temp: 98.4 F (36.9 C) (03/15 1629) Temp Source: Oral (03/15 1629) BP: 160/89 (03/15 1645) Pulse Rate: 93 (03/15 1645)  Labs: Recent Labs    05/31/23 1631 05/31/23 1640 05/31/23 2015  HGB 13.5 13.9  --   HCT 41.7 41.0  --   PLT 159  --   --   CREATININE 1.34* 1.30*  --   TROPONINIHS 40*  --  638*    CrCl cannot be calculated (Unknown ideal weight.).   Medical History: Past Medical History:  Diagnosis Date   DM (diabetes mellitus) (HCC)    GBS (Guillain Barre syndrome) (HCC)    Hypertension    Renal disorder    Stroke (HCC)     Medications:  (Not in a hospital admission)  Scheduled:   potassium chloride  40 mEq Oral Once   Infusions:  PRN:   Assessment: 71 yof with a history of HTN, DM, reflux, HLD, HF. Patient is presenting with severe abdominal pain. Heparin per pharmacy consult placed for chest pain/ACS.  Imaging without evidence for PE or aortic aneurysm or dissection.  Patient is not on anticoagulation prior to arrival.  Hgb 13.9; plt 159 hsTrop 638  Goal of Therapy:  Heparin level 0.3-0.7 units/ml Monitor platelets by anticoagulation protocol: Yes   Plan:  Give IV heparin 3500 units bolus x 1 Start heparin infusion at 750 units/hr Check anti-Xa level in 8 hours and daily while on heparin Continue to monitor H&H and platelets  Delmar Landau, PharmD, BCPS 05/31/2023 9:34 PM ED Clinical Pharmacist -  [phone]

## 2023-05-31 NOTE — ED Triage Notes (Addendum)
 Pt BIB [employer], coming from home. Pt reports eating spaghetti at approximately 1330, reports excruciating upper abdominal pain, n/v and shoulder pain after eating. Denies choking on food. Hx CHF, HTN. 4 mg zofran PTA  HR 103 Bp 150/100 98% room air

## 2023-05-31 NOTE — ED Notes (Signed)
 Called and placed PT monitor with CCMD

## 2023-05-31 NOTE — H&P (Signed)
 Date: 05/31/2023               Patient Name:  Anna Klein MRN: [id number]  DOB: 08/01/1945 Age / Sex: 78 y.o., female   PCP: Shellia Cleverly, PA         Medical Service: Internal Medicine Teaching Service         Attending Physician: Dr. Gust Rung, DO      First Contact: Dr. Carmina Miller, DO  Pager: [phone]    Second Contact: Dr. Marrianne Mood, MD  Pager: [phone]         After Hours (After 5p/  First Contact Pager: [phone]  weekends / holidays): Second Contact Pager: [phone]   SUBJECTIVE   Chief Complaint: Abdominal Pain  History of Present Illness: Anna Klein is a 78 YO F with a history of chronic diastolic heart failure, essential HTN, and GBS. Patient notes that earlier this afternoon, she experienced abdominal pain after eating her meal. She subsequently developed diffuse abdominal pain with associated nausea. Her other episodes of abdominal pain were similar in nature. Over the past week, she has had two other episodes. Over the past few months, the patient has also had chest pain associated with exertion. She has had some shortness of breath as well. She denies any recent travel or sick contacts.   ED Course: Troponin 40-> 638 Qtc 516 AGMA Bicarb 21 with AG 17 Hypokalemia 3.1  Meds:  Metoprolol 25 mg BID Hydral 25 mg BID Gabapentin 300mg  every day (unable to verify with patient) Famotidine Torsemide 10 mg Potassium Dorzolamide- Timolol Brimonidine  Past Medical History Chronic diastolic heart failure  Essential hypertension Macular Degeneration GBS Prediabetes   Past Surgical History:  Procedure Laterality Date   CHOLECYSTECTOMY N/A 03/25/2014   Procedure: LAPAROSCOPIC CHOLECYSTECTOMY WITH INTRAOPERATIVE CHOLANGIOGRAM;  Surgeon: Axel Filler, MD;  Location: [facility] OR;  Service: General;  Laterality: N/A;    Social:  Support: Independent of ADLs and iADLs PCP: Shellia Cleverly, PA Substances: None   Family History:  Family History   Problem Relation Age of Onset   Hypertension Mother    Hypertension Father     Allergies: Allergies as of 05/31/2023 - Review Complete 05/31/2023  Allergen Reaction Noted   Aspirin Other (See Comments) 05/08/2015   Codeine  05/08/2015   Nsaids Other (See Comments) 05/08/2015   Review of Systems: A complete ROS was negative except as per HPI.   OBJECTIVE:   Physical Exam: Blood pressure (!) 160/89, pulse 93, temperature 98.4 F (36.9 C), temperature source Oral, resp. rate (!) 21, height 5\' 1"  (1.549 m), weight 73 kg, SpO2 100%.  Constitutional: well-appearing sitting in no acute distress HENT: normocephalic atraumatic, mucous membranes moist Eyes: conjunctiva non-erythematous Neck: supple Cardiovascular: regular rate and rhythm, no m/r/g Pulmonary/Chest: normal work of breathing on room air, lungs clear to auscultation bilaterally Abdominal: soft, non-tender, non-distended MSK: normal bulk and tone Neurological: alert & oriented x 3, 5/5 strength in bilateral upper and lower extremities, normal gait Skin: warm and dry  Labs: CBC    Component Value Date/Time   WBC 8.2 05/31/2023 1631   RBC 4.65 05/31/2023 1631   HGB 13.9 05/31/2023 1640   HCT 41.0 05/31/2023 1640   PLT 159 05/31/2023 1631   MCV 89.7 05/31/2023 1631   MCH 29.0 05/31/2023 1631   MCHC 32.4 05/31/2023 1631   RDW 14.3 05/31/2023 1631   LYMPHSABS 1.5 05/31/2023 1631   MONOABS 0.5 05/31/2023 1631   EOSABS 0.1 05/31/2023 1631  BASOSABS 0.0 05/31/2023 1631    CMP     Component Value Date/Time   NA 142 05/31/2023 1640   K 3.0 (L) 05/31/2023 1640   CL 108 05/31/2023 1640   CO2 21 (L) 05/31/2023 1631   GLUCOSE 160 (H) 05/31/2023 1640   BUN 13 05/31/2023 1640   CREATININE 1.30 (H) 05/31/2023 1640   CALCIUM 9.9 05/31/2023 1631   PROT 6.8 05/31/2023 1631   ALBUMIN 4.1 05/31/2023 1631   AST 22 05/31/2023 1631   ALT 16 05/31/2023 1631   ALKPHOS 79 05/31/2023 1631   BILITOT 0.8 05/31/2023 1631    GFRNONAA 40 (L) 05/31/2023 1631   GFRAA 36 (L) 05/08/2015 1227    Imaging: Chest 1 View IMPRESSION: No active disease. Retrocardiac moderate-sized hiatal hernia  CT Angiography Chest, Abdomen and Pelvis  IMPRESSION: No evidence of aortic aneurysm or dissection. Scattered coronary artery and aortic atherosclerosis. No evidence of pulmonary embolus. Large hiatal hernia. Hepatic steatosis. Sigmoid diverticulosis. No acute findings in the chest, abdomen or pelvis.  EKG: personally reviewed my interpretation is sinus tachycardia.   ASSESSMENT & PLAN:   Assessment & Plan by Problem: Principal Problem:   NSTEMI (non-ST elevated myocardial infarction) (HCC)  Anna Klein is a 78 YO F with a history of chronic diastolic heart failure, essential HTN, and GBS.   #NSTEMI #Atypical Chest Pain  #Hx of Diastolic Heart Failure #Elevated Troponin  Cardiology consulted. Noted to have had history of chest pain with exertion. Suspect initial abdominal pain may have had a component of atypical chest pain. Last echo in 2023 showed EF of 55-60%. TIMI of 3. CT noted scattered coronary artery and aortic atherosclerosis. Given history of chest pain with exertion, suspect presentation to be NSTEMI. I suspect patient will likely require a cath given her concomitant elevated troponin. Will resume home lopressor and follow-up with echo for further characterization.  - Follow-up cardiology recommendation - Follow-up echo - Trend tropes  - Continue home lopressor 25 mg BID - Continue nitroglycerin PRN - Continue heparin, Plavix, and ASA    #Hypokalemia  Decreased at 3.1. Currently supplements at home with 10 mEq. Will trend BMP and replenish as needed. - Follow-up BMP    #AKI vs CKD  Creatinine elevated at 1.34. Unclear what her baseline is so will trend BMP. - Follow-up BMP   Chronic Conditions:  #Macular Degeneration: Continue home dorzolamide-timolol and Rocklatan  #Hyperlipidemia: Continue  Lipitor 80 mg daily   Diet: NPO VTE: Heparin IVF: None,None Code: Full  Prior to Admission Living Arrangement:  Home Anticipated Discharge Location: Home Barriers to Discharge: Chest pain workup   Dispo: Admit patient to Observation with expected length of stay less than 2 midnights.  Signed: Morrie Sheldon, MD Internal Medicine Resident PGY-1  05/31/2023, 11:24 PM

## 2023-06-01 ENCOUNTER — Observation Stay (HOSPITAL_COMMUNITY)

## 2023-06-01 DIAGNOSIS — Z886 Allergy status to analgesic agent status: Secondary | ICD-10-CM | POA: Diagnosis not present

## 2023-06-01 DIAGNOSIS — E1122 Type 2 diabetes mellitus with diabetic chronic kidney disease: Secondary | ICD-10-CM | POA: Diagnosis present

## 2023-06-01 DIAGNOSIS — E785 Hyperlipidemia, unspecified: Secondary | ICD-10-CM | POA: Diagnosis present

## 2023-06-01 DIAGNOSIS — Z8249 Family history of ischemic heart disease and other diseases of the circulatory system: Secondary | ICD-10-CM | POA: Diagnosis not present

## 2023-06-01 DIAGNOSIS — I214 Non-ST elevation (NSTEMI) myocardial infarction: Secondary | ICD-10-CM

## 2023-06-01 DIAGNOSIS — I428 Other cardiomyopathies: Secondary | ICD-10-CM | POA: Diagnosis present

## 2023-06-01 DIAGNOSIS — Z885 Allergy status to narcotic agent status: Secondary | ICD-10-CM | POA: Diagnosis not present

## 2023-06-01 DIAGNOSIS — I130 Hypertensive heart and chronic kidney disease with heart failure and stage 1 through stage 4 chronic kidney disease, or unspecified chronic kidney disease: Secondary | ICD-10-CM | POA: Diagnosis present

## 2023-06-01 DIAGNOSIS — Z7902 Long term (current) use of antithrombotics/antiplatelets: Secondary | ICD-10-CM | POA: Diagnosis not present

## 2023-06-01 DIAGNOSIS — Z1152 Encounter for screening for COVID-19: Secondary | ICD-10-CM | POA: Diagnosis not present

## 2023-06-01 DIAGNOSIS — N1832 Chronic kidney disease, stage 3b: Secondary | ICD-10-CM | POA: Diagnosis present

## 2023-06-01 DIAGNOSIS — I5022 Chronic systolic (congestive) heart failure: Secondary | ICD-10-CM | POA: Diagnosis not present

## 2023-06-01 DIAGNOSIS — E876 Hypokalemia: Secondary | ICD-10-CM | POA: Diagnosis present

## 2023-06-01 DIAGNOSIS — I10 Essential (primary) hypertension: Secondary | ICD-10-CM

## 2023-06-01 DIAGNOSIS — R1010 Upper abdominal pain, unspecified: Secondary | ICD-10-CM | POA: Diagnosis present

## 2023-06-01 DIAGNOSIS — Z9049 Acquired absence of other specified parts of digestive tract: Secondary | ICD-10-CM | POA: Diagnosis not present

## 2023-06-01 DIAGNOSIS — E872 Acidosis, unspecified: Secondary | ICD-10-CM | POA: Diagnosis present

## 2023-06-01 DIAGNOSIS — Z79899 Other long term (current) drug therapy: Secondary | ICD-10-CM | POA: Diagnosis not present

## 2023-06-01 DIAGNOSIS — H3530 Unspecified macular degeneration: Secondary | ICD-10-CM | POA: Diagnosis present

## 2023-06-01 DIAGNOSIS — Z7984 Long term (current) use of oral hypoglycemic drugs: Secondary | ICD-10-CM | POA: Diagnosis not present

## 2023-06-01 DIAGNOSIS — Z7982 Long term (current) use of aspirin: Secondary | ICD-10-CM | POA: Diagnosis not present

## 2023-06-01 DIAGNOSIS — I5042 Chronic combined systolic (congestive) and diastolic (congestive) heart failure: Secondary | ICD-10-CM | POA: Diagnosis present

## 2023-06-01 DIAGNOSIS — E1165 Type 2 diabetes mellitus with hyperglycemia: Secondary | ICD-10-CM | POA: Diagnosis present

## 2023-06-01 DIAGNOSIS — I2510 Atherosclerotic heart disease of native coronary artery without angina pectoris: Secondary | ICD-10-CM | POA: Diagnosis present

## 2023-06-01 DIAGNOSIS — Z8673 Personal history of transient ischemic attack (TIA), and cerebral infarction without residual deficits: Secondary | ICD-10-CM | POA: Diagnosis not present

## 2023-06-01 DIAGNOSIS — I447 Left bundle-branch block, unspecified: Secondary | ICD-10-CM | POA: Diagnosis present

## 2023-06-01 LAB — ECHOCARDIOGRAM COMPLETE
AR max vel: 1.42 cm2
AV Area VTI: 1.6 cm2
AV Area mean vel: 1.45 cm2
AV Mean grad: 5 mmHg
AV Peak grad: 9 mmHg
Ao pk vel: 1.5 m/s
Area-P 1/2: 3.85 cm2
Calc EF: 49 %
Height: 61 in
MV VTI: 1.86 cm2
S' Lateral: 2.9 cm
Single Plane A2C EF: 53.1 %
Single Plane A4C EF: 47.5 %
Weight: 2574.97 [oz_av]

## 2023-06-01 LAB — CBC
HCT: 38 % (ref 36.0–46.0)
Hemoglobin: 12.7 g/dL (ref 12.0–15.0)
MCH: 29.7 pg (ref 26.0–34.0)
MCHC: 33.4 g/dL (ref 30.0–36.0)
MCV: 88.8 fL (ref 80.0–100.0)
Platelets: 186 10*3/uL (ref 150–400)
RBC: 4.28 MIL/uL (ref 3.87–5.11)
RDW: 14.6 % (ref 11.5–15.5)
WBC: 9.9 10*3/uL (ref 4.0–10.5)
nRBC: 0 % (ref 0.0–0.2)

## 2023-06-01 LAB — MAGNESIUM
Magnesium: 2 mg/dL (ref 1.7–2.4)
Magnesium: 2.1 mg/dL (ref 1.7–2.4)

## 2023-06-01 LAB — MRSA NEXT GEN BY PCR, NASAL: MRSA by PCR Next Gen: NOT DETECTED

## 2023-06-01 LAB — BASIC METABOLIC PANEL
Anion gap: 14 (ref 5–15)
BUN: 13 mg/dL (ref 8–23)
CO2: 22 mmol/L (ref 22–32)
Calcium: 8.9 mg/dL (ref 8.9–10.3)
Chloride: 106 mmol/L (ref 98–111)
Creatinine, Ser: 1.34 mg/dL — ABNORMAL HIGH (ref 0.44–1.00)
GFR, Estimated: 40 mL/min — ABNORMAL LOW (ref >60)
Glucose, Bld: 116 mg/dL — ABNORMAL HIGH (ref 70–99)
Potassium: 3.7 mmol/L (ref 3.5–5.1)
Sodium: 142 mmol/L (ref 135–145)

## 2023-06-01 LAB — LIPID PANEL
Cholesterol: 177 mg/dL (ref 0–200)
HDL: 59 mg/dL (ref >40)
LDL Cholesterol: 94 mg/dL (ref 0–99)
Total CHOL/HDL Ratio: 3 ratio
Triglycerides: 120 mg/dL (ref <150)
VLDL: 24 mg/dL (ref 0–40)

## 2023-06-01 LAB — TROPONIN I (HIGH SENSITIVITY)
Troponin I (High Sensitivity): 1831 ng/L (ref <18)
Troponin I (High Sensitivity): 2051 ng/L (ref <18)
Troponin I (High Sensitivity): 2210 ng/L (ref <18)

## 2023-06-01 LAB — HEPARIN LEVEL (UNFRACTIONATED)
Heparin Unfractionated: 0.28 [IU]/mL — ABNORMAL LOW (ref 0.30–0.70)
Heparin Unfractionated: 0.39 [IU]/mL (ref 0.30–0.70)
Heparin Unfractionated: 0.43 [IU]/mL (ref 0.30–0.70)

## 2023-06-01 MED ORDER — SODIUM CHLORIDE 0.9 % WEIGHT BASED INFUSION: 3.0000 mL/kg/h | INTRAVENOUS | Status: DC

## 2023-06-01 MED ORDER — ONDANSETRON 4 MG PO TBDP
4.0000 mg | ORAL_TABLET | Freq: Three times a day (TID) | ORAL | Status: DC | PRN
  Administered 2023-06-01 – 2023-06-03 (×4): 4 mg via ORAL
  Filled 2023-06-01 (×4): qty 1

## 2023-06-01 MED ORDER — TORSEMIDE 20 MG PO TABS
10.0000 mg | ORAL_TABLET | Freq: Every day | ORAL | Status: DC
  Administered 2023-06-01 – 2023-06-02 (×2): 10 mg via ORAL
  Filled 2023-06-01 (×2): qty 1

## 2023-06-01 MED ORDER — POTASSIUM CHLORIDE CRYS ER 20 MEQ PO TBCR
40.0000 meq | EXTENDED_RELEASE_TABLET | Freq: Once | ORAL | Status: AC
  Administered 2023-06-01: 40 meq via ORAL
  Filled 2023-06-01: qty 2

## 2023-06-01 MED ORDER — ASPIRIN 81 MG PO CHEW
81.0000 mg | CHEWABLE_TABLET | ORAL | Status: AC
  Administered 2023-06-02: 81 mg via ORAL
  Filled 2023-06-01: qty 1

## 2023-06-01 MED ORDER — SODIUM CHLORIDE 0.9 % WEIGHT BASED INFUSION
1.0000 mL/kg/h | INTRAVENOUS | Status: DC
  Administered 2023-06-02: 1 mL/kg/h via INTRAVENOUS

## 2023-06-01 MED ORDER — MELATONIN 3 MG PO TABS
3.0000 mg | ORAL_TABLET | Freq: Every day | ORAL | Status: DC
  Administered 2023-06-01 – 2023-06-02 (×2): 3 mg via ORAL
  Filled 2023-06-01 (×2): qty 1

## 2023-06-01 NOTE — ED Notes (Signed)
 Eyedrops sent upstairs with PT's daughter. PT states she is unfamiliar with these eyedrops and would like to wait until she gets more info

## 2023-06-01 NOTE — Progress Notes (Signed)
 Progress Note  Patient Name: Anna Klein Date of Encounter: 06/01/2023  Primary Cardiologist: None     Patient Profile     78 y.o. female with a history of hypertension, non-insulin dependent diabetes, gastro-esophageal reflux disease, and status post cholecystectomy who is being seen 06/01/2023 for the evaluation of abdominal and chest pain at the request of Dr. Madilyn Hook   Tn >>2240 ECG nonspecific st depression noted 3/15 not present today  Subjective   Hx reviewed   known PVC DOE class 3  Inpatient Medications    Scheduled Meds:  aspirin EC  81 mg Oral Daily   atorvastatin  80 mg Oral Daily   dorzolamide-timolol  1 drop Both Eyes BID   latanoprost  1 drop Both Eyes QHS   metoprolol tartrate  25 mg Oral BID   Continuous Infusions:  heparin 1,000 Units/hr (06/01/23 0756)   PRN Meds: acetaminophen, nitroGLYCERIN   Vital Signs    Vitals:   05/31/23 2225 05/31/23 2225 06/01/23 0050 06/01/23 0052  BP: 123/78  108/64   Pulse: (!) 101  82 77  Resp: (!) 23  (!) 21 12  Temp:  98.6 F (37 C) 99.1 F (37.3 C)   TempSrc:  Oral Oral   SpO2: 100%  98% 98%  Weight:    73 kg  Height:        Intake/Output Summary (Last 24 hours) at 06/01/2023 0804 Last data filed at 06/01/2023 0624 Gross per 24 hour  Intake 93.44 ml  Output --  Net 93.44 ml   Filed Weights   05/31/23 2135 06/01/23 0052  Weight: 73 kg 73 kg    Telemetry    sinus with PVC quadrigeminy Personally Reviewed  ECG    (As above)  - Personally Reviewed  Physical Exam    GEN: No acute distress.   Neck: No JVD Cardiac: RRR, no murmurs, rubs, or gallops.  Respiratory: Clear to auscultation bilaterally. GI: Soft, nontender, non-distended  MS: No edema; No deformity. Neuro:  Nonfocal  Psych: Normal affect   Labs    Chemistry Recent Labs  Lab 05/31/23 1631 05/31/23 1640 06/01/23 0113  NA 142 142 142  K 3.1* 3.0* 3.7  CL 104 108 106  CO2 21*  --  22  GLUCOSE 168* 160* 116*  BUN 15 13 13    CREATININE 1.34* 1.30* 1.34*  CALCIUM 9.9  --  8.9  PROT 6.8  --   --   ALBUMIN 4.1  --   --   AST 22  --   --   ALT 16  --   --   ALKPHOS 79  --   --   BILITOT 0.8  --   --   GFRNONAA 40*  --  40*  ANIONGAP 17*  --  14     Hematology Recent Labs  Lab 05/31/23 1631 05/31/23 1640 06/01/23 0113  WBC 8.2  --  9.9  RBC 4.65  --  4.28  HGB 13.5 13.9 12.7  HCT 41.7 41.0 38.0  MCV 89.7  --  88.8  MCH 29.0  --  29.7  MCHC 32.4  --  33.4  RDW 14.3  --  14.6  PLT 159  --  186    Cardiac EnzymesNo results for input(s): "TROPONINI" in the last 168 hours. No results for input(s): "TROPIPOC" in the last 168 hours.   BNPNo results for input(s): "BNP", "PROBNP" in the last 168 hours.   DDimer No results for input(s): "DDIMER" in the  last 168 hours.   Radiology    CT Angio Chest/Abd/Pel for Dissection W and/or W/WO Result Date: 05/31/2023 CLINICAL DATA:  Upper abdominal pain, nausea, vomiting. Shoulder pain. Evaluate for acute aortic syndrome EXAM: CT ANGIOGRAPHY CHEST, ABDOMEN AND PELVIS TECHNIQUE: Non-contrast CT of the chest was initially obtained. Multidetector CT imaging through the chest, abdomen and pelvis was performed using the standard protocol during bolus administration of intravenous contrast. Multiplanar reconstructed images and MIPs were obtained and reviewed to evaluate the vascular anatomy. RADIATION DOSE REDUCTION: This exam was performed according to the departmental dose-optimization program which includes automated exposure control, adjustment of the mA and/or kV according to patient size and/or use of iterative reconstruction technique. CONTRAST:  75mL OMNIPAQUE IOHEXOL 350 MG/ML SOLN COMPARISON:  05/08/2015 FINDINGS: CTA CHEST FINDINGS Cardiovascular: Heart is normal size. Aorta is normal caliber. No evidence of aortic dissection. No filling defects in the pulmonary arteries to suggest pulmonary emboli. Scattered coronary artery and aortic atherosclerosis.  Mediastinum/Nodes: No mediastinal, hilar, or axillary adenopathy. Trachea and esophagus are unremarkable. Thyroid unremarkable. Large hiatal hernia. Lungs/Pleura: Compressive atelectasis in the lower lobes adjacent to the large hiatal hernia. No confluent airspace opacities or effusions. Musculoskeletal: Chest wall soft tissues are unremarkable. No acute bony abnormality. Review of the MIP images confirms the above findings. CTA ABDOMEN AND PELVIS FINDINGS VASCULAR Aorta: Normal caliber aorta without aneurysm, dissection, vasculitis or significant stenosis. Aortic atherosclerosis. Celiac: Patent without evidence of aneurysm, dissection, vasculitis or significant stenosis. Calcified plaque at the origin. SMA: Patent without evidence of aneurysm, dissection, vasculitis or significant stenosis. Renals: Suspect mild stenosis at the origin of the left renal artery. No aneurysm, dissection, or evidence of vasculitis. IMA: Patent. Inflow: Patent without evidence of aneurysm, dissection, vasculitis or significant stenosis. Atherosclerotic calcifications. Veins: No obvious venous abnormality within the limitations of this arterial phase study. Review of the MIP images confirms the above findings. NON-VASCULAR Hepatobiliary: Diffuse low-density throughout the liver compatible with fatty infiltration. Prior cholecystectomy. Pancreas: No focal abnormality or ductal dilatation. Spleen: No focal abnormality.  Normal size. Adrenals/Urinary Tract: No adrenal abnormality. No focal renal abnormality. No stones or hydronephrosis. Urinary bladder is unremarkable. Stomach/Bowel: Sigmoid diverticulosis. No active diverticulitis. Stomach and small bowel decompressed, unremarkable. Normal appendix. Lymphatic: No adenopathy Reproductive: Uterus and adnexa unremarkable.  No mass. Other: No free fluid or free air. Musculoskeletal: No acute bony abnormality. Review of the MIP images confirms the above findings. IMPRESSION: No evidence of  aortic aneurysm or dissection. Scattered coronary artery and aortic atherosclerosis. No evidence of pulmonary embolus. Large hiatal hernia. Hepatic steatosis. Sigmoid diverticulosis. No acute findings in the chest, abdomen or pelvis. Electronically Signed   By: Charlett Nose M.D.   On: 05/31/2023 18:36   DG Chest Port 1 View Result Date: 05/31/2023 CLINICAL DATA:  Chest pain. EXAM: PORTABLE CHEST 1 VIEW COMPARISON:  03/26/2017. FINDINGS: Low lung volume. There are probable atelectatic changes at the lung bases. Bilateral lung fields are otherwise clear. No dense consolidation or lung collapse. Bilateral costophrenic angles are clear. Mildly enlarged cardio-mediastinal silhouette. Retrocardiac moderate-sized hiatal hernia noted. No acute osseous abnormalities. Lower cervical spinal fixation hardware noted. The soft tissues are within normal limits. IMPRESSION: No active disease. Retrocardiac moderate-sized hiatal hernia. Electronically Signed   By: Jules Schick M.D.   On: 05/31/2023 16:55    ECG ST depressions noted yesterday (3/15) are no longer evident Qw V1-3  but suspect lead placement   Cardiac Studies   Echo p Assessment & Plan  NSTEMI/ACS  Hypertension  DOE/HF currently unspecified   PVCs LBBB inferior Axis  DM   Hyperlipemia  Renal insufficiency 3b  Estimated Creatinine Clearance: 31.1 mL/min (A) (by C-G formula based on SCr of 1.34 mg/dL (H)). Hypokalemia resolved  Persistent hypokalemia on torsemide, now resolved PVC freq  may be contributing to DOE by way of secondary cardiomyopathy  Anticipate cath in am  Meds per echo Continue statin asa and heparin  and BB started with increased HR        For questions or updates, please contact [facility] Please consult [email or web address] for contact info under Cardiology/STEMI.      Signed, Sherryl Manges, MD  06/01/2023, 8:04 AM

## 2023-06-01 NOTE — Care Management Obs Status (Signed)
 MEDICARE OBSERVATION STATUS NOTIFICATION   Patient Details  Name: Anna Klein MRN: [id number] Date of Birth: 10-Apr-1945   Medicare Observation Status Notification Given:  Yes    Ronny Bacon, RN 06/01/2023, 1:41 PM

## 2023-06-01 NOTE — Consult Note (Signed)
 Cardiology Consultation   Patient ID: Anna Klein MRN: [id number]; DOB: 12-27-1945  Admit date: 05/31/2023 Date of Consult: 06/01/2023  PCP:  Anna Cleverly, PA   [facility] Providers Cardiologist:  None        Patient Profile:   Anna Klein is a 78 y.o. female with a history of hypertension, non-insulin dependent diabetes, gastro-esophageal reflux disease, and status post cholecystectomy who is being seen 06/01/2023 for the evaluation of abdominal and chest pain at the request of Anna Klein.  History of Present Illness:   Anna Klein reports around 1:30 pm yesterday afternoon she noticed intense dull-ache/pressure-like pain at the bottom Klein of her chest that eventually radiated to her abdomen.  The pain was associated with shortness of breath, lightheadedness, sweating, nausea, and vomiting.  She states her pain lasted at least 3 hours and didn't resolve until after being admitted to the hospital and given medications in the emergency department.  She is now reporting 3/10 pain.  Patient reports intermittent pain that's similar for the past week that occurred mostly at night while lying in bed, usually lasting around an hour.  In the past few days, she has noticed some swelling in her legs but denies any other symptoms such as shortness of breath with exertion, shortness of breath with lying flat, waking up due to shortness of breath, or palpitations.      When patient presented to the emergency department her initial ECG demonstrated diffuse non-specific ST depressions.  HS-troponin was elevated at 638 ng/L and eventually peaked to 2,210 ng/L. Chest, abdomen, and pelvic angiography without any acute findings but noted coronary artery calcifications.  She was given high dose aspirin and initiated on a heparin drip.  Patient was admitted to the hospitalist service with cardiology consulted.    Past Medical History:  Diagnosis Date   DM (diabetes mellitus) (HCC)    GBS (Guillain  Barre syndrome) (HCC)    Hypertension    Renal disorder    Stroke Anna Klein)     Past Surgical History:  Procedure Laterality Date   CHOLECYSTECTOMY N/A 03/25/2014   Procedure: LAPAROSCOPIC CHOLECYSTECTOMY WITH INTRAOPERATIVE CHOLANGIOGRAM;  Surgeon: Anna Filler, MD;  Location: [facility] OR;  Service: General;  Laterality: N/A;     Home Medications:  Prior to Admission medications   Medication Sig Start Date End Date Taking? Authorizing Provider  Ascorbic Acid (VITAMIN C PO) Take 1 tablet by mouth daily.   Yes [provider]  brimonidine (ALPHAGAN) 0.2 % ophthalmic solution Place 1 drop into both eyes 3 (three) times daily. 01/05/19  Yes [provider]  dorzolamide-timolol (COSOPT) 2-0.5 % ophthalmic solution Place 1 drop into both eyes 2 (two) times daily.   Yes [provider]  famotidine (PEPCID) 40 MG tablet Take 40 mg by mouth daily as needed for heartburn. 02/18/22  Yes [provider]  gabapentin (NEURONTIN) 300 MG capsule Take 300 mg by mouth at bedtime. 04/27/15  Yes [provider]  hydrALAZINE (APRESOLINE) 25 MG tablet Take 1 tablet (25 mg total) by mouth every 8 (eight) hours. Patient taking differently: Take 25 mg by mouth in the morning and at bedtime. 03/30/14  Yes Dhungel, Nishant, MD  MAGNESIUM PO Take 500 mg by mouth at bedtime.   Yes [provider]  melatonin 3 MG TABS tablet Take 3 mg by mouth at bedtime. 01/15/17  Yes [provider]  metoprolol tartrate (LOPRESSOR) 25 MG tablet Take 25 mg by mouth 2 (two) times daily.  Yes [provider]  Multiple Vitamins-Minerals (PRESERVISION AREDS) CAPS Take 1 capsule by mouth in the morning and at bedtime. 07/10/17  Yes [provider]  Multiple Vitamins-Minerals (ZINC PO) Take 1 capsule by mouth daily.   Yes [provider]  potassium chloride (KLOR-CON) 10 MEQ tablet Take 20 mEq by mouth every other day. 05/09/22  Yes [provider]   ROCKLATAN 0.02-0.005 % SOLN Place 1 drop into both eyes at bedtime. 10/25/21  Yes [provider]  torsemide (DEMADEX) 10 MG tablet Take 10 mg by mouth daily.   Yes [provider]  VITAMIN A PO Take 1 capsule by mouth daily.   Yes [provider]  VITAMIN D, CHOLECALCIFEROL, PO Take 1 capsule by mouth daily.   Yes [provider]  cyanocobalamin (,VITAMIN B-12,) 1000 MCG/ML injection Inject 1 mL (1,000 mcg total) into the muscle once a week. Patient not taking: Reported on 06/01/2023 04/11/14   Anna Gilding, MD  gabapentin (NEURONTIN) 100 MG capsule Take 1 capsule (100 mg total) by mouth 2 (two) times daily. Patient taking differently: Take 300 mg by mouth at bedtime. 03/30/14   Dhungel, Theda Belfast, MD    Inpatient Medications: Scheduled Meds:  aspirin EC  81 mg Oral Daily   atorvastatin  80 mg Oral Daily   dorzolamide-timolol  1 drop Both Eyes BID   latanoprost  1 drop Both Eyes QHS   metoprolol tartrate  25 mg Oral BID   Continuous Infusions:  heparin 750 Units/hr (06/01/23 0624)   PRN Meds: acetaminophen, nitroGLYCERIN  Allergies:    Allergies  Allergen Reactions   Aspirin Other (See Comments)    Due to kidney function   Codeine Nausea Only   Nsaids Other (See Comments)    Cannot take due to kidney function.    Social History:   Social History   Socioeconomic History   Marital status: Widowed    Spouse name: Not on file   Number of children: Not on file   Years of education: Not on file   Highest education level: Not on file  Occupational History   Not on file  Tobacco Use   Smoking status: Never   Smokeless tobacco: Not on file  Substance and Sexual Activity   Alcohol use: No   Drug use: No   Sexual activity: Yes    Birth control/protection: Post-menopausal  Other Topics Concern   Not on file  Social History Narrative   Not on file   Social Drivers of Health   Financial Resource Strain: Not on file  Food Insecurity:  No Food Insecurity (06/01/2023)   Hunger Vital Sign    Worried About Running Out of Food in the Last Year: Never true    Ran Out of Food in the Last Year: Never true  Transportation Needs: No Transportation Needs (06/01/2023)   PRAPARE - Administrator, Civil Service (Medical): No    Lack of Transportation (Non-Medical): No  Physical Activity: Not on file  Stress: Not on file  Social Connections: Socially Isolated (06/01/2023)   Social Connection and Isolation Panel [NHANES]    Frequency of Communication with Friends and Family: More than three times a week    Frequency of Social Gatherings with Friends and Family: More than three times a week    Attends Religious Services: Never    Database administrator or Organizations: No    Attends Banker Meetings: Never    Marital Status: Widowed  Intimate Partner Violence: Not At Risk (06/01/2023)   Humiliation, Afraid, Rape, and Kick questionnaire    Fear of Current or Ex-Partner: No    Emotionally Abused: No    Physically Abused: No    Sexually Abused: No    Family History:   Family History  Problem Relation Age of Onset   Hypertension Mother    Hypertension Father      ROS:  Please see the history of present illness.  All other ROS reviewed and negative.     Physical Exam/Data:   Vitals:   05/31/23 2225 05/31/23 2225 06/01/23 0050 06/01/23 0052  BP: 123/78  108/64   Pulse: (!) 101  82 77  Resp: (!) 23  (!) 21 12  Temp:  98.6 F (37 C) 99.1 F (37.3 C)   TempSrc:  Oral Oral   SpO2: 100%  98% 98%  Weight:    73 kg  Height:        Intake/Output Summary (Last 24 hours) at 06/01/2023 0647 Last data filed at 06/01/2023 1610 Gross per 24 hour  Intake 93.44 ml  Output --  Net 93.44 ml      06/01/2023   12:52 AM 05/31/2023    9:35 PM 05/08/2015   12:24 PM  Last 3 Weights  Weight (lbs) 160 lb 15 oz 160 lb 15 oz 159 lb  Weight (kg) 73 kg 73 kg 72.122 kg     Body mass index is 30.41 kg/m.  General:   Well nourished, well developed, in no acute distress HEENT: normal Neck: no JVD Vascular: No carotid bruits; Distal pulses 2+ bilaterally Cardiac:  normal S1, S2; RRR; no murmur  Lungs:  clear to auscultation bilaterally, no wheezing, rhonchi or rales  Abd: soft, nontender, no hepatomegaly  Ext: no edema Musculoskeletal:  No deformities, BUE and BLE strength normal and equal Skin: warm and dry  Neuro:  CNs 2-12 intact, no focal abnormalities noted Psych:  Normal affect   EKG:  The EKG was personally reviewed and demonstrates:  05/31/23 (16:33 hours): sinus tachycardia; occasional PVCs; non-specific ST changes Telemetry:  Telemetry was personally reviewed and demonstrates:  Sinus rhythm.  Relevant CV Studies: # Echocardiogram 03/28/14: Study Conclusions  - Left ventricle: The cavity size was normal. Wall thickness was    increased in a pattern of mild LVH. Systolic function was normal.    The estimated ejection fraction was 55%. Wall motion was normal;    there were no regional wall motion abnormalities. Doppler    parameters are consistent with abnormal left ventricular    relaxation (grade 1 diastolic dysfunction).  - Atrial septum: No defect or patent foramen ovale was identified.   Laboratory Data:  High Sensitivity Troponin:   Recent Labs  Lab 05/31/23 1631 05/31/23 2015 05/31/23 2346 06/01/23 0113 06/01/23 0248  TROPONINIHS 40* 638* 2,051* 2,210* 1,831*     Chemistry Recent Labs  Lab 05/31/23 1631 05/31/23 1640 06/01/23 0113  NA 142 142 142  K 3.1* 3.0* 3.7  CL 104 108 106  CO2 21*  --  22  GLUCOSE 168* 160* 116*  BUN 15 13 13   CREATININE 1.34* 1.30* 1.34*  CALCIUM 9.9  --  8.9  MG  --   --  2.0  GFRNONAA 40*  --  40*  ANIONGAP 17*  --  14    Recent Labs  Lab 05/31/23 1631  PROT 6.8  ALBUMIN 4.1  AST 22  ALT 16  ALKPHOS 79  BILITOT 0.8  Lipids  Recent Labs  Lab 06/01/23 0113  CHOL 177  TRIG 120  HDL 59  LDLCALC 94  CHOLHDL 3.0     Hematology Recent Labs  Lab 05/31/23 1631 05/31/23 1640 06/01/23 0113  WBC 8.2  --  9.9  RBC 4.65  --  4.28  HGB 13.5 13.9 12.7  HCT 41.7 41.0 38.0  MCV 89.7  --  88.8  MCH 29.0  --  29.7  MCHC 32.4  --  33.4  RDW 14.3  --  14.6  PLT 159  --  186   Thyroid No results for input(s): "TSH", "FREET4" in the last 168 hours.  BNPNo results for input(s): "BNP", "PROBNP" in the last 168 hours.  DDimer No results for input(s): "DDIMER" in the last 168 hours.   Radiology/Studies:  CT Angio Chest/Abd/Pel for Dissection W and/or W/WO Result Date: 05/31/2023 CLINICAL DATA:  Upper abdominal pain, nausea, vomiting. Shoulder pain. Evaluate for acute aortic syndrome EXAM: CT ANGIOGRAPHY CHEST, ABDOMEN AND PELVIS TECHNIQUE: Non-contrast CT of the chest was initially obtained. Multidetector CT imaging through the chest, abdomen and pelvis was performed using the standard protocol during bolus administration of intravenous contrast. Multiplanar reconstructed images and MIPs were obtained and reviewed to evaluate the vascular anatomy. RADIATION DOSE REDUCTION: This exam was performed according to the departmental dose-optimization program which includes automated exposure control, adjustment of the mA and/or kV according to patient size and/or use of iterative reconstruction technique. CONTRAST:  75mL OMNIPAQUE IOHEXOL 350 MG/ML SOLN COMPARISON:  05/08/2015 FINDINGS: CTA CHEST FINDINGS Cardiovascular: Heart is normal size. Aorta is normal caliber. No evidence of aortic dissection. No filling defects in the pulmonary arteries to suggest pulmonary emboli. Scattered coronary artery and aortic atherosclerosis. Mediastinum/Nodes: No mediastinal, hilar, or axillary adenopathy. Trachea and esophagus are unremarkable. Thyroid unremarkable. Large hiatal hernia. Lungs/Pleura: Compressive atelectasis in the lower lobes adjacent to the large hiatal hernia. No confluent airspace opacities or effusions. Musculoskeletal: Chest  wall soft tissues are unremarkable. No acute bony abnormality. Review of the MIP images confirms the above findings. CTA ABDOMEN AND PELVIS FINDINGS VASCULAR Aorta: Normal caliber aorta without aneurysm, dissection, vasculitis or significant stenosis. Aortic atherosclerosis. Celiac: Patent without evidence of aneurysm, dissection, vasculitis or significant stenosis. Calcified plaque at the origin. SMA: Patent without evidence of aneurysm, dissection, vasculitis or significant stenosis. Renals: Suspect mild stenosis at the origin of the left renal artery. No aneurysm, dissection, or evidence of vasculitis. IMA: Patent. Inflow: Patent without evidence of aneurysm, dissection, vasculitis or significant stenosis. Atherosclerotic calcifications. Veins: No obvious venous abnormality within the limitations of this arterial phase study. Review of the MIP images confirms the above findings. NON-VASCULAR Hepatobiliary: Diffuse low-density throughout the liver compatible with fatty infiltration. Prior cholecystectomy. Pancreas: No focal abnormality or ductal dilatation. Spleen: No focal abnormality.  Normal size. Adrenals/Urinary Tract: No adrenal abnormality. No focal renal abnormality. No stones or hydronephrosis. Urinary bladder is unremarkable. Stomach/Bowel: Sigmoid diverticulosis. No active diverticulitis. Stomach and small bowel decompressed, unremarkable. Normal appendix. Lymphatic: No adenopathy Reproductive: Uterus and adnexa unremarkable.  No mass. Other: No free fluid or free air. Musculoskeletal: No acute bony abnormality. Review of the MIP images confirms the above findings. IMPRESSION: No evidence of aortic aneurysm or dissection. Scattered coronary artery and aortic atherosclerosis. No evidence of pulmonary embolus. Large hiatal hernia. Hepatic steatosis. Sigmoid diverticulosis. No acute findings in the chest, abdomen or pelvis. Electronically Signed   By: Charlett Nose M.D.   On: 05/31/2023 18:36   DG Chest  [facility]  1 View Result Date: 05/31/2023 CLINICAL DATA:  Chest pain. EXAM: PORTABLE CHEST 1 VIEW COMPARISON:  03/26/2017. FINDINGS: Low lung volume. There are probable atelectatic changes at the lung bases. Bilateral lung fields are otherwise clear. No dense consolidation or lung collapse. Bilateral costophrenic angles are clear. Mildly enlarged cardio-mediastinal silhouette. Retrocardiac moderate-sized hiatal hernia noted. No acute osseous abnormalities. Lower cervical spinal fixation hardware noted. The soft tissues are within normal limits. IMPRESSION: No active disease. Retrocardiac moderate-sized hiatal hernia. Electronically Signed   By: Jules Schick M.D.   On: 05/31/2023 16:55     Assessment and Plan:   Abdominal/Chest pain: Patient with initial abdominal pain that radiated throughout her abdomen in the setting of a rising HS-troponin, consistent with likely NSTEMI-ACS.  She is hemodynamically stable but still reporting 3/10 chest pain.  ECG initially with non-specific ST changes.  Patient was initiated on heparin drip and given high-dose aspirin.    --Continue heparin drip and aspirin. --Check lipid panel and continue atorvastatin.   --Continue metoprolol tartrate.   --Echocardiogram ordered and day time cardiology team to review echocardiogram. --Make NPO after midnight for possible coronary angiography on Monday.    Hypertension: Primary hypertension with stage IIIb chronic kidney disease.  Blood pressure at this time is at goal.  Previously taking torsemide and metoprolol tartrate at home. --Continue home metoprolol.  Risk Assessment/Risk Scores:     TIMI Risk Score for Unstable Angina or Non-ST Elevation MI:   The patient's TIMI risk score is 4, which indicates a 20% risk of all cause mortality, new or recurrent myocardial infarction or need for urgent revascularization in the next 14 days.         For questions or updates, please contact [facility] Please consult  [email or web address] for contact info under    Signed, Judie Grieve, MD  06/01/2023 6:47 AM

## 2023-06-01 NOTE — Progress Notes (Signed)
 ANTICOAGULATION CONSULT NOTE  Pharmacy Consult for Heparin Indication: chest pain/ACS  Allergies  Allergen Reactions   Aspirin Other (See Comments)    Due to kidney function   Codeine Nausea Only   Nsaids Other (See Comments)    Cannot take due to kidney function.    Patient Measurements: Height: 5\' 1"  (154.9 cm) Weight: 73 kg (160 lb 15 oz) IBW/kg (Calculated) : 47.8 Heparin Dosing Weight: 63.7 kg  Vital Signs: Temp: 99.5 F (37.5 C) (03/16 1111) Temp Source: Oral (03/16 1111) BP: 103/66 (03/16 1111) Pulse Rate: 67 (03/16 1111)  Labs: Recent Labs    05/31/23 1631 05/31/23 1640 05/31/23 2015 05/31/23 2346 06/01/23 0113 06/01/23 0248 06/01/23 0726 06/01/23 1343  HGB 13.5 13.9  --   --  12.7  --   --   --   HCT 41.7 41.0  --   --  38.0  --   --   --   PLT 159  --   --   --  186  --   --   --   HEPARINUNFRC  --   --   --   --   --   --  0.28* 0.39  CREATININE 1.34* 1.30*  --   --  1.34*  --   --   --   TROPONINIHS 40*  --    < > 2,051* 2,210* 1,831*  --   --    < > = values in this interval not displayed.    Estimated Creatinine Clearance: 31.1 mL/min (A) (by C-G formula based on SCr of 1.34 mg/dL (H)).   Medical History: Past Medical History:  Diagnosis Date   DM (diabetes mellitus) (HCC)    GBS (Guillain Barre syndrome) (HCC)    Hypertension    Renal disorder    Stroke (HCC)     Medications:  Medications Prior to Admission  Medication Sig Dispense Refill Last Dose/Taking   Ascorbic Acid (VITAMIN C PO) Take 1 tablet by mouth daily.   05/29/2023   brimonidine (ALPHAGAN) 0.2 % ophthalmic solution Place 1 drop into both eyes 3 (three) times daily.   05/31/2023 Morning   dorzolamide-timolol (COSOPT) 2-0.5 % ophthalmic solution Place 1 drop into both eyes 2 (two) times daily.   05/31/2023 Morning   famotidine (PEPCID) 40 MG tablet Take 40 mg by mouth daily as needed for heartburn.   Past Week   gabapentin (NEURONTIN) 300 MG capsule Take 300 mg by mouth at  bedtime.   05/31/2023 Bedtime   hydrALAZINE (APRESOLINE) 25 MG tablet Take 1 tablet (25 mg total) by mouth every 8 (eight) hours. (Patient taking differently: Take 25 mg by mouth in the morning and at bedtime.) 60 tablet 0 05/31/2023 Morning   MAGNESIUM PO Take 500 mg by mouth at bedtime.   05/30/2023   melatonin 3 MG TABS tablet Take 3 mg by mouth at bedtime.   05/30/2023   metoprolol tartrate (LOPRESSOR) 25 MG tablet Take 25 mg by mouth 2 (two) times daily.   05/31/2023 Morning   Multiple Vitamins-Minerals (PRESERVISION AREDS) CAPS Take 1 capsule by mouth in the morning and at bedtime.   05/31/2023 Morning   Multiple Vitamins-Minerals (ZINC PO) Take 1 capsule by mouth daily.   05/31/2023 Morning   potassium chloride (KLOR-CON) 10 MEQ tablet Take 20 mEq by mouth every other day.   05/31/2023 Morning   ROCKLATAN 0.02-0.005 % SOLN Place 1 drop into both eyes at bedtime.   Taking   torsemide (DEMADEX) 10  MG tablet Take 10 mg by mouth daily.   05/31/2023 Morning   VITAMIN A PO Take 1 capsule by mouth daily.   05/31/2023 Morning   VITAMIN D, CHOLECALCIFEROL, PO Take 1 capsule by mouth daily.   05/29/2023   cyanocobalamin (,VITAMIN B-12,) 1000 MCG/ML injection Inject 1 mL (1,000 mcg total) into the muscle once a week. (Patient not taking: Reported on 06/01/2023) 1 mL 0 Not Taking   gabapentin (NEURONTIN) 100 MG capsule Take 1 capsule (100 mg total) by mouth 2 (two) times daily. (Patient taking differently: Take 300 mg by mouth at bedtime.) 30 capsule 0 05/30/2023   Scheduled:   aspirin EC  81 mg Oral Daily   atorvastatin  80 mg Oral Daily   dorzolamide-timolol  1 drop Both Eyes BID   latanoprost  1 drop Both Eyes QHS   melatonin  3 mg Oral QHS   metoprolol tartrate  25 mg Oral BID   potassium chloride  40 mEq Oral Once   torsemide  10 mg Oral Daily   Infusions:   heparin 1,000 Units/hr (06/01/23 0756)   PRN: acetaminophen, nitroGLYCERIN, ondansetron  Assessment: 67 YOF with a history of HTN, DM, reflux,  HLD, HF. Patient is presenting with severe abdominal pain. Heparin per pharmacy consult placed for chest pain/ACS.  Imaging without evidence for PE or aortic aneurysm or dissection.  Patient is not on anticoagulation prior to arrival.  PM: Heparin level is therapeutic at 0.39 on UHF 1000 units/hour. CBC stable. No signs of bleeding or issues with the infusion documented.  Goal of Therapy:  Heparin level 0.3-0.7 units/ml Monitor platelets by anticoagulation protocol: Yes   Plan:  Continue UFH infusion at 1000 unit/hour Check confirmatory anti-Xa level in ~8 hours and daily while on heparin Continue to monitor H&H and platelets Follow up plans after cath, likely 3/17  Loralee Pacas, PharmD, BCPS 06/01/2023 2:35 PM  Please check AMION for all [facility] Pharmacy phone numbers After 10:00 PM, call Main Pharmacy [phone]

## 2023-06-01 NOTE — Plan of Care (Signed)
   Problem: Education: Goal: Knowledge of General Education information will improve Description: Including pain rating scale, medication(s)/side effects and non-pharmacologic comfort measures Outcome: Progressing   Problem: Clinical Measurements: Goal: Ability to maintain clinical measurements within normal limits will improve Outcome: Progressing

## 2023-06-01 NOTE — Progress Notes (Signed)
                 Interval history Troponin peak around 2000 overnight.  This morning she feels somewhat better than she did when she got at the hospital.  Her chest discomfort is 2 out of 10 relative to the worst that it was prior to admission.  She has some mild nausea.  She is not having any breathing difficulty, she denies urinary problems.  Physical exam Blood pressure 109/62, pulse 76, temperature 99.1 F (37.3 C), temperature source Oral, resp. rate 12, height 5\' 1"  (1.549 m), weight 73 kg, SpO2 98%.  No distress Heart rate and rhythm are normal, radial pulses strong, no murmurs, trace edema in lower extremities Breathing comfortably on room air, faint left basilar crackles Skin is warm and dry Alert, no facial asymmetry, speech normal sounding, answering questions appropriately  Labs, images, and other studies Electrolytes within normal limits Creatinine 1.34, EGFR 40 Troponin 40 => 2210 => 1831 CBC within normal limits EKG shows normal sinus rhythm without acute ST segment changes  Assessment and plan Hospital day 0  Anna Klein is a 78 y.o. with chronic diastolic congestive heart failure who presented with nausea and chest discomfort and is admitted for NSTEMI.  Principal Problem:   NSTEMI (non-ST elevated myocardial infarction) (HCC) Stable.  Chest comfort improved since admission, still some nausea.  Troponin peaked around 2000.  EKG overnight looked okay.  Anticipate LHC in AM. - Aspirin 81 mg daily - Atorvastatin 80 mg daily - Metoprolol tartrate 25 mg twice daily - Heparin infusion  Active Problems:   DM type 2 (diabetes mellitus, type 2) (HCC) With mild hyperglycemia on admission.  Recent A1c was 6.  Seems well-controlled.  No insulin needs here.    Chronic diastolic heart failure (HCC) Not volume overloaded.  Well compensated.  Takes a low-dose of torsemide outside of the hospital which I will restart today. - Start torsemide 10 mg daily    Primary  hypertension Normotensive without blood pressure therapy here.  Hold outpatient ENT hypertensives.    CKD stage 3b, GFR 30-44 ml/min (HCC) Renal insufficiency on BMP.  GFR in CKD 3B range.  She has had CKD 3B range GFR dating as far back as 2016 so I suspect that this is a chronic process rather than acute.  Resolved Problems:   * No resolved hospital problems. *  VTE prophylaxis:  Diet: Heart healthy IVF: N/A Code: Full PT/OT recommendations: N/A TOC recommendations: N/A Family Update: At bedside  Discharge plan: Pending heart catheterization and workup for NSTEMI.  Marrianne Mood MD 06/01/2023, 11:00 AM  Pager: [phone] After 5pm or weekend: [phone]

## 2023-06-01 NOTE — Progress Notes (Signed)
  Echocardiogram 2D Echocardiogram has been performed.  Ocie Doyne RDCS 06/01/2023, 1:39 PM

## 2023-06-01 NOTE — Progress Notes (Signed)
 ANTICOAGULATION CONSULT NOTE  Pharmacy Consult for Heparin Indication: chest pain/ACS  Allergies  Allergen Reactions   Aspirin Other (See Comments)    Due to kidney function   Codeine Nausea Only   Nsaids Other (See Comments)    Cannot take due to kidney function.    Patient Measurements: Height: 5\' 1"  (154.9 cm) Weight: 73 kg (160 lb 15 oz) IBW/kg (Calculated) : 47.8 Heparin Dosing Weight: 63.7 kg  Vital Signs: Temp: 97.7 F (36.5 C) (03/16 2006) Temp Source: Oral (03/16 2006) BP: 126/72 (03/16 2006) Pulse Rate: 74 (03/16 2006)  Labs: Recent Labs    05/31/23 1631 05/31/23 1640 05/31/23 2015 05/31/23 2346 06/01/23 0113 06/01/23 0248 06/01/23 0726 06/01/23 1343 06/01/23 2101  HGB 13.5 13.9  --   --  12.7  --   --   --   --   HCT 41.7 41.0  --   --  38.0  --   --   --   --   PLT 159  --   --   --  186  --   --   --   --   HEPARINUNFRC  --   --   --   --   --   --  0.28* 0.39 0.43  CREATININE 1.34* 1.30*  --   --  1.34*  --   --   --   --   TROPONINIHS 40*  --    < > 2,051* 2,210* 1,831*  --   --   --    < > = values in this interval not displayed.    Estimated Creatinine Clearance: 31.1 mL/min (A) (by C-G formula based on SCr of 1.34 mg/dL (H)).   Medical History: Past Medical History:  Diagnosis Date   DM (diabetes mellitus) (HCC)    GBS (Guillain Barre syndrome) (HCC)    Hypertension    Renal disorder    Stroke (HCC)     Medications:  Medications Prior to Admission  Medication Sig Dispense Refill Last Dose/Taking   Ascorbic Acid (VITAMIN C PO) Take 1 tablet by mouth daily.   05/29/2023   brimonidine (ALPHAGAN) 0.2 % ophthalmic solution Place 1 drop into both eyes 3 (three) times daily.   05/31/2023 Morning   dorzolamide-timolol (COSOPT) 2-0.5 % ophthalmic solution Place 1 drop into both eyes 2 (two) times daily.   05/31/2023 Morning   famotidine (PEPCID) 40 MG tablet Take 40 mg by mouth daily as needed for heartburn.   Past Week   gabapentin  (NEURONTIN) 300 MG capsule Take 300 mg by mouth at bedtime.   05/31/2023 Bedtime   hydrALAZINE (APRESOLINE) 25 MG tablet Take 1 tablet (25 mg total) by mouth every 8 (eight) hours. (Patient taking differently: Take 25 mg by mouth in the morning and at bedtime.) 60 tablet 0 05/31/2023 Morning   MAGNESIUM PO Take 500 mg by mouth at bedtime.   05/30/2023   melatonin 3 MG TABS tablet Take 3 mg by mouth at bedtime.   05/30/2023   metoprolol tartrate (LOPRESSOR) 25 MG tablet Take 25 mg by mouth 2 (two) times daily.   05/31/2023 Morning   Multiple Vitamins-Minerals (PRESERVISION AREDS) CAPS Take 1 capsule by mouth in the morning and at bedtime.   05/31/2023 Morning   Multiple Vitamins-Minerals (ZINC PO) Take 1 capsule by mouth daily.   05/31/2023 Morning   potassium chloride (KLOR-CON) 10 MEQ tablet Take 20 mEq by mouth every other day.   05/31/2023 Morning   ROCKLATAN 0.02-0.005 %  SOLN Place 1 drop into both eyes at bedtime.   Taking   torsemide (DEMADEX) 10 MG tablet Take 10 mg by mouth daily.   05/31/2023 Morning   VITAMIN A PO Take 1 capsule by mouth daily.   05/31/2023 Morning   VITAMIN D, CHOLECALCIFEROL, PO Take 1 capsule by mouth daily.   05/29/2023   cyanocobalamin (,VITAMIN B-12,) 1000 MCG/ML injection Inject 1 mL (1,000 mcg total) into the muscle once a week. (Patient not taking: Reported on 06/01/2023) 1 mL 0 Not Taking   gabapentin (NEURONTIN) 100 MG capsule Take 1 capsule (100 mg total) by mouth 2 (two) times daily. (Patient taking differently: Take 300 mg by mouth at bedtime.) 30 capsule 0 05/30/2023   Scheduled:   aspirin EC  81 mg Oral Daily   atorvastatin  80 mg Oral Daily   dorzolamide-timolol  1 drop Both Eyes BID   latanoprost  1 drop Both Eyes QHS   melatonin  3 mg Oral QHS   metoprolol tartrate  25 mg Oral BID   torsemide  10 mg Oral Daily   Infusions:   heparin 1,000 Units/hr (06/01/23 1839)   PRN: acetaminophen, nitroGLYCERIN, ondansetron  Assessment: 65 YOF with a history of HTN,  DM, reflux, HLD, HF. Patient is presenting with severe abdominal pain. Heparin per pharmacy consult placed for chest pain/ACS.  Imaging without evidence for PE or aortic aneurysm or dissection.  Patient is not on anticoagulation prior to arrival.  Repeat heparin level remains therapeutic.  Goal of Therapy:  Heparin level 0.3-0.7 units/ml Monitor platelets by anticoagulation protocol: Yes   Plan:  Continue heparin infusion at 1000 unit/hour Daily heparin level and CBC  Fredonia Highland, PharmD, [place], [facility] Clinical Pharmacist [phone] Please check AMION for all [facility] Pharmacy numbers 06/01/2023

## 2023-06-01 NOTE — ED Notes (Signed)
 Pharmacy tech at the bedside completing medication rec.

## 2023-06-01 NOTE — Progress Notes (Signed)
 ANTICOAGULATION CONSULT NOTE  Pharmacy Consult for Heparin Indication: chest pain/ACS  Allergies  Allergen Reactions   Aspirin Other (See Comments)    Due to kidney function   Codeine Nausea Only   Nsaids Other (See Comments)    Cannot take due to kidney function.    Patient Measurements: Height: 5\' 1"  (154.9 cm) Weight: 73 kg (160 lb 15 oz) IBW/kg (Calculated) : 47.8 Heparin Dosing Weight: 63.7 kg  Vital Signs: Temp: 99.1 F (37.3 C) (03/16 0050) Temp Source: Oral (03/16 0050) BP: 108/64 (03/16 0050) Pulse Rate: 77 (03/16 0052)  Labs: Recent Labs    05/31/23 1631 05/31/23 1640 05/31/23 2015 05/31/23 2346 06/01/23 0113 06/01/23 0248  HGB 13.5 13.9  --   --  12.7  --   HCT 41.7 41.0  --   --  38.0  --   PLT 159  --   --   --  186  --   CREATININE 1.34* 1.30*  --   --  1.34*  --   TROPONINIHS 40*  --    < > 2,051* 2,210* 1,831*   < > = values in this interval not displayed.    Estimated Creatinine Clearance: 31.1 mL/min (A) (by C-G formula based on SCr of 1.34 mg/dL (H)).   Medical History: Past Medical History:  Diagnosis Date   DM (diabetes mellitus) (HCC)    GBS (Guillain Barre syndrome) (HCC)    Hypertension    Renal disorder    Stroke (HCC)     Medications:  Medications Prior to Admission  Medication Sig Dispense Refill Last Dose/Taking   Ascorbic Acid (VITAMIN C PO) Take 1 tablet by mouth daily.   05/29/2023   brimonidine (ALPHAGAN) 0.2 % ophthalmic solution Place 1 drop into both eyes 3 (three) times daily.   05/31/2023 Morning   dorzolamide-timolol (COSOPT) 2-0.5 % ophthalmic solution Place 1 drop into both eyes 2 (two) times daily.   05/31/2023 Morning   famotidine (PEPCID) 40 MG tablet Take 40 mg by mouth daily as needed for heartburn.   Past Week   gabapentin (NEURONTIN) 300 MG capsule Take 300 mg by mouth at bedtime.   05/31/2023 Bedtime   hydrALAZINE (APRESOLINE) 25 MG tablet Take 1 tablet (25 mg total) by mouth every 8 (eight) hours. (Patient  taking differently: Take 25 mg by mouth in the morning and at bedtime.) 60 tablet 0 05/31/2023 Morning   MAGNESIUM PO Take 500 mg by mouth at bedtime.   05/30/2023   melatonin 3 MG TABS tablet Take 3 mg by mouth at bedtime.   05/30/2023   metoprolol tartrate (LOPRESSOR) 25 MG tablet Take 25 mg by mouth 2 (two) times daily.   05/31/2023 Morning   Multiple Vitamins-Minerals (PRESERVISION AREDS) CAPS Take 1 capsule by mouth in the morning and at bedtime.   05/31/2023 Morning   Multiple Vitamins-Minerals (ZINC PO) Take 1 capsule by mouth daily.   05/31/2023 Morning   potassium chloride (KLOR-CON) 10 MEQ tablet Take 20 mEq by mouth every other day.   05/31/2023 Morning   ROCKLATAN 0.02-0.005 % SOLN Place 1 drop into both eyes at bedtime.   Taking   torsemide (DEMADEX) 10 MG tablet Take 10 mg by mouth daily.   05/31/2023 Morning   VITAMIN A PO Take 1 capsule by mouth daily.   05/31/2023 Morning   VITAMIN D, CHOLECALCIFEROL, PO Take 1 capsule by mouth daily.   05/29/2023   cyanocobalamin (,VITAMIN B-12,) 1000 MCG/ML injection Inject 1 mL (1,000 mcg total)  into the muscle once a week. (Patient not taking: Reported on 06/01/2023) 1 mL 0 Not Taking   gabapentin (NEURONTIN) 100 MG capsule Take 1 capsule (100 mg total) by mouth 2 (two) times daily. (Patient taking differently: Take 300 mg by mouth at bedtime.) 30 capsule 0 05/30/2023   Scheduled:   aspirin EC  81 mg Oral Daily   atorvastatin  80 mg Oral Daily   dorzolamide-timolol  1 drop Both Eyes BID   latanoprost  1 drop Both Eyes QHS   metoprolol tartrate  25 mg Oral BID   Infusions:   heparin 750 Units/hr (06/01/23 0624)   PRN: acetaminophen, nitroGLYCERIN  Assessment: 77 YOF with a history of HTN, DM, reflux, HLD, HF. Patient is presenting with severe abdominal pain. Heparin per pharmacy consult placed for chest pain/ACS.  Imaging without evidence for PE or aortic aneurysm or dissection.  Patient is not on anticoagulation prior to arrival.  Heparin  level is subtherapeutic at 0.28 on UHF 750 units/hour. CBC stable. No signs of bleeding.  Goal of Therapy:  Heparin level 0.3-0.7 units/ml Monitor platelets by anticoagulation protocol: Yes   Plan:  Increase UFH infusion to 1000 unit/hour Check anti-Xa level in 6 hours and daily while on heparin Continue to monitor H&H and platelets  Wilmer Floor, PharmD PGY2 Cardiology Pharmacy Resident 06/01/2023 6:52 AM

## 2023-06-01 NOTE — Plan of Care (Signed)
  Problem: Education: Goal: Understanding of cardiac disease, CV risk reduction, and recovery process will improve Outcome: Progressing   Problem: Activity: Goal: Ability to tolerate increased activity will improve Outcome: Progressing   Problem: Cardiac: Goal: Ability to achieve and maintain adequate cardiovascular perfusion will improve Outcome: Progressing   Problem: Clinical Measurements: Goal: Ability to maintain clinical measurements within normal limits will improve Outcome: Progressing Goal: Will remain free from infection Outcome: Progressing

## 2023-06-02 ENCOUNTER — Other Ambulatory Visit (HOSPITAL_COMMUNITY): Payer: Self-pay

## 2023-06-02 ENCOUNTER — Encounter (HOSPITAL_COMMUNITY): Payer: Self-pay | Admitting: Cardiology

## 2023-06-02 ENCOUNTER — Encounter (HOSPITAL_COMMUNITY)
Admission: EM | Disposition: A | Discharge status: Home / Self Care | Payer: Self-pay | Source: Home / Self Care | Attending: Internal Medicine

## 2023-06-02 DIAGNOSIS — I214 Non-ST elevation (NSTEMI) myocardial infarction: Secondary | ICD-10-CM | POA: Diagnosis not present

## 2023-06-02 DIAGNOSIS — I5022 Chronic systolic (congestive) heart failure: Secondary | ICD-10-CM

## 2023-06-02 DIAGNOSIS — I2510 Atherosclerotic heart disease of native coronary artery without angina pectoris: Secondary | ICD-10-CM | POA: Diagnosis not present

## 2023-06-02 HISTORY — PX: LEFT HEART CATH AND CORONARY ANGIOGRAPHY: CATH118249

## 2023-06-02 LAB — BASIC METABOLIC PANEL
Anion gap: 9 (ref 5–15)
BUN: 12 mg/dL (ref 8–23)
CO2: 25 mmol/L (ref 22–32)
Calcium: 8.8 mg/dL — ABNORMAL LOW (ref 8.9–10.3)
Chloride: 107 mmol/L (ref 98–111)
Creatinine, Ser: 1.3 mg/dL — ABNORMAL HIGH (ref 0.44–1.00)
GFR, Estimated: 42 mL/min — ABNORMAL LOW (ref >60)
Glucose, Bld: 115 mg/dL — ABNORMAL HIGH (ref 70–99)
Potassium: 3.8 mmol/L (ref 3.5–5.1)
Sodium: 141 mmol/L (ref 135–145)

## 2023-06-02 LAB — CBC
HCT: 37.5 % (ref 36.0–46.0)
Hemoglobin: 12.3 g/dL (ref 12.0–15.0)
MCH: 29.1 pg (ref 26.0–34.0)
MCHC: 32.8 g/dL (ref 30.0–36.0)
MCV: 88.9 fL (ref 80.0–100.0)
Platelets: 152 10*3/uL (ref 150–400)
RBC: 4.22 MIL/uL (ref 3.87–5.11)
RDW: 14.7 % (ref 11.5–15.5)
WBC: 5.7 10*3/uL (ref 4.0–10.5)
nRBC: 0 % (ref 0.0–0.2)

## 2023-06-02 LAB — HEPARIN LEVEL (UNFRACTIONATED): Heparin Unfractionated: 0.67 [IU]/mL (ref 0.30–0.70)

## 2023-06-02 SURGERY — LEFT HEART CATH AND CORONARY ANGIOGRAPHY
Anesthesia: LOCAL

## 2023-06-02 MED ORDER — SODIUM CHLORIDE 0.9 % IV SOLN: 250.0000 mL | INTRAVENOUS | Status: DC | PRN

## 2023-06-02 MED ORDER — LIDOCAINE HCL (PF) 1 % IJ SOLN
INTRAMUSCULAR | Status: AC
Start: 2023-06-02 — End: ?
  Filled 2023-06-02: qty 30

## 2023-06-02 MED ORDER — SODIUM CHLORIDE 0.9 % WEIGHT BASED INFUSION
1.0000 mL/kg/h | INTRAVENOUS | Status: AC
  Administered 2023-06-02 (×2): 1 mL/kg/h via INTRAVENOUS

## 2023-06-02 MED ORDER — IOHEXOL 350 MG/ML SOLN
INTRAVENOUS | Status: DC | PRN
  Administered 2023-06-02: 65 mL via INTRA_ARTERIAL

## 2023-06-02 MED ORDER — FENTANYL CITRATE (PF) 100 MCG/2ML IJ SOLN
INTRAMUSCULAR | Status: DC | PRN
  Administered 2023-06-02 (×2): 25 ug via INTRAVENOUS

## 2023-06-02 MED ORDER — LIDOCAINE HCL (PF) 1 % IJ SOLN
INTRAMUSCULAR | Status: DC | PRN
  Administered 2023-06-02: 2 mL

## 2023-06-02 MED ORDER — CLOPIDOGREL BISULFATE 300 MG PO TABS
ORAL_TABLET | ORAL | Status: DC | PRN
  Administered 2023-06-02: 300 mg via ORAL

## 2023-06-02 MED ORDER — HEPARIN SODIUM (PORCINE) 1000 UNIT/ML IJ SOLN
INTRAMUSCULAR | Status: AC
  Filled 2023-06-02: qty 10

## 2023-06-02 MED ORDER — CLOPIDOGREL BISULFATE 75 MG PO TABS
75.0000 mg | ORAL_TABLET | Freq: Every day | ORAL | Status: DC
  Administered 2023-06-03: 75 mg via ORAL
  Filled 2023-06-02: qty 1

## 2023-06-02 MED ORDER — FENTANYL CITRATE (PF) 100 MCG/2ML IJ SOLN
INTRAMUSCULAR | Status: AC
  Filled 2023-06-02: qty 2

## 2023-06-02 MED ORDER — HEPARIN SODIUM (PORCINE) 1000 UNIT/ML IJ SOLN
INTRAMUSCULAR | Status: DC | PRN
  Administered 2023-06-02: 6000 [IU] via INTRAVENOUS

## 2023-06-02 MED ORDER — SACUBITRIL-VALSARTAN 49-51 MG PO TABS
1.0000 | ORAL_TABLET | Freq: Two times a day (BID) | ORAL | Status: DC
  Administered 2023-06-02: 1 via ORAL
  Filled 2023-06-02 (×3): qty 1

## 2023-06-02 MED ORDER — SODIUM CHLORIDE 0.9% FLUSH: 3.0000 mL | INTRAVENOUS | Status: DC | PRN

## 2023-06-02 MED ORDER — POLYETHYLENE GLYCOL 3350 17 G PO PACK
17.0000 g | PACK | Freq: Every day | ORAL | Status: DC
  Filled 2023-06-02: qty 1

## 2023-06-02 MED ORDER — VERAPAMIL HCL 2.5 MG/ML IV SOLN
INTRAVENOUS | Status: AC
Start: 2023-06-02 — End: ?
  Filled 2023-06-02: qty 2

## 2023-06-02 MED ORDER — CLOPIDOGREL BISULFATE 300 MG PO TABS
ORAL_TABLET | ORAL | Status: AC
  Filled 2023-06-02: qty 1

## 2023-06-02 MED ORDER — EMPAGLIFLOZIN 10 MG PO TABS
10.0000 mg | ORAL_TABLET | Freq: Every day | ORAL | Status: DC
  Administered 2023-06-02 – 2023-06-03 (×2): 10 mg via ORAL
  Filled 2023-06-02 (×2): qty 1

## 2023-06-02 MED ORDER — ATORVASTATIN CALCIUM 40 MG PO TABS
40.0000 mg | ORAL_TABLET | Freq: Every day | ORAL | Status: DC
  Administered 2023-06-03: 40 mg via ORAL
  Filled 2023-06-02: qty 1

## 2023-06-02 MED ORDER — HEPARIN (PORCINE) IN NACL 1000-0.9 UT/500ML-% IV SOLN
INTRAVENOUS | Status: DC | PRN
  Administered 2023-06-02 (×2): 500 mL

## 2023-06-02 MED ORDER — SODIUM CHLORIDE 0.9% FLUSH: 3.0000 mL | Freq: Two times a day (BID) | INTRAVENOUS | Status: DC

## 2023-06-02 MED ORDER — MIDAZOLAM HCL 2 MG/2ML IJ SOLN
INTRAMUSCULAR | Status: AC
  Filled 2023-06-02: qty 2

## 2023-06-02 MED ORDER — MIDAZOLAM HCL 2 MG/2ML IJ SOLN
INTRAMUSCULAR | Status: DC | PRN
  Administered 2023-06-02 (×2): 1 mg via INTRAVENOUS

## 2023-06-02 MED ORDER — HEPARIN (PORCINE) IN NACL 2-0.9 UNITS/ML
INTRAMUSCULAR | Status: DC | PRN
  Administered 2023-06-02: 10 mL via INTRA_ARTERIAL

## 2023-06-02 SURGICAL SUPPLY — 7 items
CATH INFINITI AMBI 5FR TG (CATHETERS) IMPLANT
DEVICE RAD COMP TR BAND LRG (VASCULAR PRODUCTS) IMPLANT
GLIDESHEATH SLEND A-KIT 6F 22G (SHEATH) IMPLANT
GUIDEWIRE ANGLED .035X150CM (WIRE) IMPLANT
PACK CARDIAC CATHETERIZATION (CUSTOM PROCEDURE TRAY) ×1 IMPLANT
SET ATX-X65L (MISCELLANEOUS) IMPLANT
WIRE EMERALD 3MM-J .035X260CM (WIRE) IMPLANT

## 2023-06-02 NOTE — Plan of Care (Signed)
   Problem: Activity: Goal: Ability to tolerate increased activity will improve Outcome: Progressing

## 2023-06-02 NOTE — Progress Notes (Signed)
                 Subjective Feels better. Denies chest pain/palpitations.   Physical exam Blood pressure 117/60, pulse (!) 57, temperature 98.3 F (36.8 C), temperature source Oral, resp. rate 18, height 5\' 1"  (1.549 m), weight 73 kg, SpO2 100%.  Constitutional: well-appearing, lying in bed, in no acute distress Cardiovascular: regular rate and rhythm, no m/r/g, no LEE Pulmonary/Chest: normal work of breathing on room air, lungs clear to auscultation bilaterally Abdominal: soft, non-tender, non-distended Skin: warm and dry Psych: normal mood and behavior   Weight change:    Intake/Output Summary (Last 24 hours) at 06/02/2023 1505 Last data filed at 06/02/2023 0935 Gross per 24 hour  Intake 357.9 ml  Output 100 ml  Net 257.9 ml   Net IO Since Admission: 951.34 mL [06/02/23 1505]  Labs, images, and other studies    Latest Ref Rng & Units 06/02/2023    2:19 AM 06/01/2023    1:13 AM 05/31/2023    4:40 PM  CBC  WBC 4.0 - 10.5 K/uL 5.7  9.9    Hemoglobin 12.0 - 15.0 g/dL 84.6  96.2  95.2   Hematocrit 36.0 - 46.0 % 37.5  38.0  41.0   Platelets 150 - 400 K/uL 152  186         Latest Ref Rng & Units 06/02/2023    2:19 AM 06/01/2023    1:13 AM 05/31/2023    4:40 PM  BMP  Glucose 70 - 99 mg/dL 841  324  401   BUN 8 - 23 mg/dL 12  13  13    Creatinine 0.44 - 1.00 mg/dL 0.27  2.53  6.64   Sodium 135 - 145 mmol/L 141  142  142   Potassium 3.5 - 5.1 mmol/L 3.8  3.7  3.0   Chloride 98 - 111 mmol/L 107  106  108   CO2 22 - 32 mmol/L 25  22    Calcium 8.9 - 10.3 mg/dL 8.8  8.9       Assessment and plan Hospital day 1  Anna Klein is a 78 y.o.chronic diastolic congestive heart failure who presented with nausea and chest discomfort and is admitted for NSTEMI.   NSTEMI LHC today without significant stenosis. No chest pain today. HDS. She tolerated procedure well and would like to stay tonight with plan for discharge tomorrow.  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 No signs of volume overload -Continue Lopressor per above -Continue Torsemide 10 mg/daily -Start Jardiance 10 mg/daily -Start Entresto 49-51 mg BID  Chronic Conditions  T2DM Well controlled with 02/2023 A1c of 6.0. CBG stable this admission -Jardiance per above  CKD3B Creatinine is stable. Sherryll Burger and Jardiance per above   Primary hypertension Stable. Meds above.    Carmina Miller, DO 06/02/2023, 3:05 PM  Pager: [phone] After 5pm or weekend: [phone]

## 2023-06-02 NOTE — Progress Notes (Signed)
 PT Cancellation Note  Patient Details Name: Anna Klein MRN: [id number] DOB: 03-01-1946   Cancelled Treatment:    Reason Eval/Treat Not Completed: Other (comment); just back from bathroom and evening meal delivered.  Discussed her plan to stay overnight.  PT will follow up in AM prior to d/c.    [name] 06/02/2023, 5:00 PM [name], PT Acute Rehabilitation Services Office:[phone] 06/02/2023

## 2023-06-02 NOTE — Progress Notes (Signed)
 ANTICOAGULATION CONSULT NOTE  Pharmacy Consult for Heparin Indication: chest pain/ACS  Allergies  Allergen Reactions   Aspirin Other (See Comments)    Due to kidney function   Codeine Nausea Only   Nsaids Other (See Comments)    Cannot take due to kidney function.    Patient Measurements: Height: 5\' 1"  (154.9 cm) Weight: 73 kg (160 lb 15 oz) IBW/kg (Calculated) : 47.8 Heparin Dosing Weight: 63.7 kg  Vital Signs: Temp: 98.6 F (37 C) (03/17 0352) Temp Source: Oral (03/17 0352) BP: 112/65 (03/17 0352) Pulse Rate: 74 (03/16 2006)  Labs: Recent Labs    05/31/23 1631 05/31/23 1640 05/31/23 2015 05/31/23 2346 06/01/23 0113 06/01/23 0248 06/01/23 0726 06/01/23 1343 06/01/23 2101 06/02/23 0219  HGB 13.5 13.9  --   --  12.7  --   --   --   --  12.3  HCT 41.7 41.0  --   --  38.0  --   --   --   --  37.5  PLT 159  --   --   --  186  --   --   --   --  152  HEPARINUNFRC  --   --   --   --   --   --    < > 0.39 0.43 0.67  CREATININE 1.34* 1.30*  --   --  1.34*  --   --   --   --  1.30*  TROPONINIHS 40*  --    < > 2,051* 2,210* 1,831*  --   --   --   --    < > = values in this interval not displayed.    Estimated Creatinine Clearance: 32.1 mL/min (A) (by C-G formula based on SCr of 1.3 mg/dL (H)).   Medical History: Past Medical History:  Diagnosis Date   DM (diabetes mellitus) (HCC)    GBS (Guillain Barre syndrome) (HCC)    Hypertension    Renal disorder    Stroke (HCC)     Medications:  Medications Prior to Admission  Medication Sig Dispense Refill Last Dose/Taking   Ascorbic Acid (VITAMIN C PO) Take 1 tablet by mouth daily.   05/29/2023   brimonidine (ALPHAGAN) 0.2 % ophthalmic solution Place 1 drop into both eyes 3 (three) times daily.   05/31/2023 Morning   dorzolamide-timolol (COSOPT) 2-0.5 % ophthalmic solution Place 1 drop into both eyes 2 (two) times daily.   05/31/2023 Morning   famotidine (PEPCID) 40 MG tablet Take 40 mg by mouth daily as needed for  heartburn.   Past Week   gabapentin (NEURONTIN) 300 MG capsule Take 300 mg by mouth at bedtime.   05/31/2023 Bedtime   hydrALAZINE (APRESOLINE) 25 MG tablet Take 1 tablet (25 mg total) by mouth every 8 (eight) hours. (Patient taking differently: Take 25 mg by mouth in the morning and at bedtime.) 60 tablet 0 05/31/2023 Morning   MAGNESIUM PO Take 500 mg by mouth at bedtime.   05/30/2023   melatonin 3 MG TABS tablet Take 3 mg by mouth at bedtime.   05/30/2023   metoprolol tartrate (LOPRESSOR) 25 MG tablet Take 25 mg by mouth 2 (two) times daily.   05/31/2023 Morning   Multiple Vitamins-Minerals (PRESERVISION AREDS) CAPS Take 1 capsule by mouth in the morning and at bedtime.   05/31/2023 Morning   Multiple Vitamins-Minerals (ZINC PO) Take 1 capsule by mouth daily.   05/31/2023 Morning   potassium chloride (KLOR-CON) 10 MEQ tablet Take 20 mEq  by mouth every other day.   05/31/2023 Morning   ROCKLATAN 0.02-0.005 % SOLN Place 1 drop into both eyes at bedtime.   Taking   torsemide (DEMADEX) 10 MG tablet Take 10 mg by mouth daily.   05/31/2023 Morning   VITAMIN A PO Take 1 capsule by mouth daily.   05/31/2023 Morning   VITAMIN D, CHOLECALCIFEROL, PO Take 1 capsule by mouth daily.   05/29/2023   cyanocobalamin (,VITAMIN B-12,) 1000 MCG/ML injection Inject 1 mL (1,000 mcg total) into the muscle once a week. (Patient not taking: Reported on 06/01/2023) 1 mL 0 Not Taking   gabapentin (NEURONTIN) 100 MG capsule Take 1 capsule (100 mg total) by mouth 2 (two) times daily. (Patient taking differently: Take 300 mg by mouth at bedtime.) 30 capsule 0 05/30/2023   Scheduled:   aspirin  81 mg Oral Pre-Cath   aspirin EC  81 mg Oral Daily   atorvastatin  80 mg Oral Daily   dorzolamide-timolol  1 drop Both Eyes BID   latanoprost  1 drop Both Eyes QHS   melatonin  3 mg Oral QHS   metoprolol tartrate  25 mg Oral BID   torsemide  10 mg Oral Daily   Infusions:   sodium chloride     heparin 1,000 Units/hr (06/01/23 1839)    PRN: acetaminophen, nitroGLYCERIN, ondansetron  Assessment: 33 YOF with a history of HTN, DM, reflux, HLD, HF. Patient is presenting with severe abdominal pain. Heparin per pharmacy consult placed for chest pain/ACS.  Imaging without evidence for PE or aortic aneurysm or dissection.  Patient is not on anticoagulation prior to arrival.  heparin level remains therapeutic.  Goal of Therapy:  Heparin level 0.3-0.7 units/ml Monitor platelets by anticoagulation protocol: Yes   Plan:  Continue heparin infusion at 1000 unit/hour Daily heparin level and CBC F/up post planned LHC scheduled for 3/17  Greta Doom BS, PharmD, BCPS Clinical Pharmacist 06/02/2023 6:17 AM  Contact: [phone] after 3 PM  "Be curious, not judgmental..." -Debbora Dus

## 2023-06-02 NOTE — Progress Notes (Signed)
 PT Cancellation Note  Patient Details Name: Anna Klein MRN: [id number] DOB: 02-19-1946   Cancelled Treatment:    Reason Eval/Treat Not Completed: Patient not medically ready; spoke with RN, pt still with TR band and has to release more pressure and then lay flat 30 min.  Will attempt later as schedule permits.    [name] 06/02/2023, 3:31 PM [name], PT Acute Rehabilitation Services Office:[phone] 06/02/2023

## 2023-06-02 NOTE — Progress Notes (Signed)
   Patient Name: Anna Klein Date of Encounter: 06/02/2023 [facility] Cardiologist: Graciela Husbands  Interval Summary  .    No acute events overnight.  No chest pain since yesterday AM (requiring NTG x 2)  Vital Signs .    Vitals:   06/01/23 2006 06/02/23 0005 06/02/23 0352 06/02/23 0745  BP: 126/72 125/60 112/65 114/63  Pulse: 74   62  Resp: 14 (!) 22 18 17   Temp: 97.7 F (36.5 C) 98.3 F (36.8 C) 98.6 F (37 C) 97.8 F (36.6 C)  TempSrc: Oral Oral Oral Oral  SpO2:   94%   Weight:      Height:        Intake/Output Summary (Last 24 hours) at 06/02/2023 0834 Last data filed at 06/01/2023 1839 Gross per 24 hour  Intake 957.9 ml  Output --  Net 957.9 ml      06/01/2023   12:52 AM 05/31/2023    9:35 PM 05/08/2015   12:24 PM  Last 3 Weights  Weight (lbs) 160 lb 15 oz 160 lb 15 oz 159 lb  Weight (kg) 73 kg 73 kg 72.122 kg      Telemetry/ECG    SR with PVCs - Personally Reviewed  Physical Exam .   GEN: No acute distress.   Neck: No JVD Cardiac: RRR, no murmurs, rubs, or gallops.  Respiratory: Clear to auscultation bilaterally. GI: Soft, nontender, non-distended  MS: No edema  Assessment & Plan .      ACS:  Cont hep gtt, ASA 81, lopressor 25 bid, atorvastatin 80.  Coronary angiography today given TIMI >3.  Patient has R radial access, no contraindications to DAPT. DM:  Diet controlled, cont ASA 81, atorvastatin 80.  Consider ARB and SGLTi in future. HTN:  Start ARB prior to discharge.  I have reviewed the risks, indications, and alternatives to cardiac catheterization, possible angioplasty, and stenting with the patient. Risks include but are not limited to bleeding, infection, vascular injury, stroke, myocardial infection, arrhythmia, kidney injury, radiation-related injury in the case of prolonged fluoroscopy use, emergency cardiac surgery, and death. The patient understands the risks of serious complication is 1-2 in 1000 with diagnostic cardiac cath and 1-2% or  less with angioplasty/stenting.   For questions or updates, please contact [facility] Please consult [email or web address] for contact info under        Signed, Orbie Pyo, MD

## 2023-06-02 NOTE — Progress Notes (Signed)
 TR band removed at 1733 with no complications.

## 2023-06-02 NOTE — Progress Notes (Signed)
 Chaplain responded to [facility] consult for Advance Directive. Pt was accompanied by her two daughters and adult granddaughter. Chaplain introduced spiritual care and provided AD education (details following). Chaplain asked open ended questions to facilitate emotional expression and story telling. Anna Klein shared that she had a heart attack this weekend while caring for her 80 year old grandson. She is awaiting a procedure in the cath lab to determine whether she needs further intervention. She is hopeful that she will be able to go home today. Anna Klein was somewhat tearful when she shared about having symptoms while Excell Seltzer was with him. Chaplain commended her quick action, particularly in recognizing symptoms that felt different than she traditionally identified as related to MI, particularly persistent indigestion. She does not report any anxiety about the procedure and is patiently awaiting being worked in. We discussed Advance Directives, as she has a durable power of attorney, but not medical, though she is not ready to complete at this time. Chaplain concluded the visit with prayer at the family's request and in their tradition.   Chaplain responded to a consult request for Advance Directive education.   Chaplain provided the Advance Directive packet as well as education on Advance Directives-documents an individual completes to communicate their health care directions in advance of a time when they may need them. Chaplain informed pt the documents which may be completed here in the hospital are the Living Will and Health Care Power of [place].   Chaplain informed that the Health Care Power of [place] is a legal document in which an individual names another person, their Health Care Agent, to make health care decisions when the individual is not able to make them for themselves. The Health Care Agent's function can be temporary or permanent depending on the pt's ability to make and communicate those decisions  independently. Chaplain informed pt in the absence of a Health Care Power of [place], the state of [place] directs health care providers to look to the following individuals in the order listed: legal guardian; an attorney?in?fact under a general power of attorney (POA) if that POA includes the right to make health care decisions; a husband or wife; a majority of parents and adult children; a majority of adult brothers and sisters; or an individual who has an established relationship with you, who is acting in good faith and who can convey your wishes.  If none of these person are available or willing to make medical decisions on a patient's behalf, the law allows the patient's doctor to make decisions for them as long as another doctor agrees with those decisions.  Chaplain also informed the patient that the Health Care agent has no decision-making authority over any affairs other than those related to his or her medical care.   The chaplain further educated the pt that a Living Will is a legal document that allows an individual to state his or her desire not to receive life-prolonging measures in the event that they have a condition that is incurable and will result in their death in a short period of time; they are unconscious, and doctors are confident that they will not regain consciousness; and/or they have advanced dementia or other substantial and irreversible loss of mental function. The chaplain informed pt that life-prolonging measures are medical treatments that would only serve to postpone death, including breathing machines, kidney dialysis, antibiotics, artificial nutrition and hydration (tube feeding), and similar forms of treatment and that if an individual is able to express their wishes, they  may also make them known without the use of a Living Will, but in the event that an individual is not able to express their wishes themselves, a Living Will allows medical providers and the pt's  family and friends ensure that they are not making decisions on the pt's behalf, but rather serving as the pt's voice to convey decisions the pt has already made.   The patient is aware that the decision to create an advance directive is theirs alone and they may chose not to complete the documents or may chose to complete one portion or both.  The patient was informed that they can revoke the documents at any time by striking through them and writing void or by completing new documents, but that it is also advisable that the individual verbally notify interested parties that their wishes have changed.  They are also aware that the document must be signed in the presence of a notary public and two witnesses and that this can be done while the patient is still admitted to the hospital or after discharge in the community. If they decide to complete Advance Directives after being discharged from the hospital, they have been advised to notify all interested parties and to provide those documents to their physicians and loved ones in addition to bringing them to the hospital in the event of another hospitalization.   The chaplain informed the pt that if they desire to proceed with completing Advance Directive Documentation while they are still admitted, notary services are typically available at [facility] between the hours of 1:00 and 3:30 Monday-Thursday.    When the patient is ready to have these documents completed, the patient should request that their nurse place a spiritual care consult and indicate that the patient is ready to have their advance directives notarized so that arrangements for witnesses and notary public can be made.  Please page spiritual care if the patient desires further education or has questions.     [name]. Carley Hammed, M.Div. [facility] Chaplain Pager [phone] Office [phone]

## 2023-06-02 NOTE — Interval H&P Note (Signed)
 History and Physical Interval Note:  06/02/2023 1:01 PM  Anna Klein  has presented today for surgery, with the diagnosis of NSTEMI.  The various methods of treatment have been discussed with the patient and family. After consideration of risks, benefits and other options for treatment, the patient has consented to  Procedure(s): LEFT HEART CATH AND CORONARY ANGIOGRAPHY (N/A) and possible coronary angioplasty as a surgical intervention.  The patient's history has been reviewed, patient examined, no change in status, stable for surgery.  I have reviewed the patient's chart and labs.  Questions were answered to the patient's satisfaction.   Patient presented with unstable angina pectoris with positive cardiac markers suggestive of NSTEMI, EKG had revealed lateral ST segment depression which is now normalized this morning.  All questions answered to the patient today.   Yates Decamp

## 2023-06-02 NOTE — Plan of Care (Signed)

## 2023-06-02 NOTE — H&P (View-Only) (Signed)
   Patient Name: Anna Klein Date of Encounter: 06/02/2023 [facility] Cardiologist: Graciela Husbands  Interval Summary  .    No acute events overnight.  No chest pain since yesterday AM (requiring NTG x 2)  Vital Signs .    Vitals:   06/01/23 2006 06/02/23 0005 06/02/23 0352 06/02/23 0745  BP: 126/72 125/60 112/65 114/63  Pulse: 74   62  Resp: 14 (!) 22 18 17   Temp: 97.7 F (36.5 C) 98.3 F (36.8 C) 98.6 F (37 C) 97.8 F (36.6 C)  TempSrc: Oral Oral Oral Oral  SpO2:   94%   Weight:      Height:        Intake/Output Summary (Last 24 hours) at 06/02/2023 0834 Last data filed at 06/01/2023 1839 Gross per 24 hour  Intake 957.9 ml  Output --  Net 957.9 ml      06/01/2023   12:52 AM 05/31/2023    9:35 PM 05/08/2015   12:24 PM  Last 3 Weights  Weight (lbs) 160 lb 15 oz 160 lb 15 oz 159 lb  Weight (kg) 73 kg 73 kg 72.122 kg      Telemetry/ECG    SR with PVCs - Personally Reviewed  Physical Exam .   GEN: No acute distress.   Neck: No JVD Cardiac: RRR, no murmurs, rubs, or gallops.  Respiratory: Clear to auscultation bilaterally. GI: Soft, nontender, non-distended  MS: No edema  Assessment & Plan .      ACS:  Cont hep gtt, ASA 81, lopressor 25 bid, atorvastatin 80.  Coronary angiography today given TIMI >3.  Patient has R radial access, no contraindications to DAPT. DM:  Diet controlled, cont ASA 81, atorvastatin 80.  Consider ARB and SGLTi in future. HTN:  Start ARB prior to discharge.  I have reviewed the risks, indications, and alternatives to cardiac catheterization, possible angioplasty, and stenting with the patient. Risks include but are not limited to bleeding, infection, vascular injury, stroke, myocardial infection, arrhythmia, kidney injury, radiation-related injury in the case of prolonged fluoroscopy use, emergency cardiac surgery, and death. The patient understands the risks of serious complication is 1-2 in 1000 with diagnostic cardiac cath and 1-2% or  less with angioplasty/stenting.   For questions or updates, please contact [facility] Please consult [email or web address] for contact info under        Signed, Orbie Pyo, MD

## 2023-06-02 NOTE — Progress Notes (Addendum)
 Chaplain received consult for Advance Directive education and assessment. Chaplain will follow up

## 2023-06-02 NOTE — Plan of Care (Signed)
  Problem: Education: Goal: Understanding of cardiac disease, CV risk reduction, and recovery process will improve Outcome: Progressing   Problem: Activity: Goal: Ability to tolerate increased activity will improve Outcome: Progressing   Problem: Cardiac: Goal: Ability to achieve and maintain adequate cardiovascular perfusion will improve Outcome: Progressing   Problem: Education: Goal: Knowledge of General Education information will improve Description: Including pain rating scale, medication(s)/side effects and non-pharmacologic comfort measures Outcome: Progressing   Problem: Health Behavior/Discharge Planning: Goal: Ability to manage health-related needs will improve Outcome: Progressing   Problem: Clinical Measurements: Goal: Respiratory complications will improve Outcome: Progressing Goal: Cardiovascular complication will be avoided Outcome: Progressing   Problem: Activity: Goal: Risk for activity intolerance will decrease Outcome: Progressing   Problem: Nutrition: Goal: Adequate nutrition will be maintained Outcome: Progressing   Problem: Elimination: Goal: Will not experience complications related to bowel motility Outcome: Progressing Goal: Will not experience complications related to urinary retention Outcome: Progressing   Problem: Pain Managment: Goal: General experience of comfort will improve and/or be controlled Outcome: Progressing   Problem: Activity: Goal: Ability to return to baseline activity level will improve Outcome: Progressing   Problem: Cardiovascular: Goal: Ability to achieve and maintain adequate cardiovascular perfusion will improve Outcome: Progressing

## 2023-06-03 ENCOUNTER — Other Ambulatory Visit (HOSPITAL_COMMUNITY): Payer: Self-pay

## 2023-06-03 ENCOUNTER — Telehealth (HOSPITAL_COMMUNITY): Payer: Self-pay | Admitting: Pharmacy Technician

## 2023-06-03 DIAGNOSIS — I5020 Unspecified systolic (congestive) heart failure: Secondary | ICD-10-CM

## 2023-06-03 DIAGNOSIS — I214 Non-ST elevation (NSTEMI) myocardial infarction: Secondary | ICD-10-CM | POA: Diagnosis not present

## 2023-06-03 DIAGNOSIS — I5022 Chronic systolic (congestive) heart failure: Secondary | ICD-10-CM | POA: Diagnosis not present

## 2023-06-03 LAB — BASIC METABOLIC PANEL
Anion gap: 10 (ref 5–15)
BUN: 14 mg/dL (ref 8–23)
CO2: 24 mmol/L (ref 22–32)
Calcium: 8.7 mg/dL — ABNORMAL LOW (ref 8.9–10.3)
Chloride: 106 mmol/L (ref 98–111)
Creatinine, Ser: 1.46 mg/dL — ABNORMAL HIGH (ref 0.44–1.00)
GFR, Estimated: 36 mL/min — ABNORMAL LOW (ref >60)
Glucose, Bld: 102 mg/dL — ABNORMAL HIGH (ref 70–99)
Potassium: 3.2 mmol/L — ABNORMAL LOW (ref 3.5–5.1)
Sodium: 140 mmol/L (ref 135–145)

## 2023-06-03 MED ORDER — POTASSIUM CHLORIDE 20 MEQ PO PACK
40.0000 meq | PACK | Freq: Two times a day (BID) | ORAL | Status: DC
  Administered 2023-06-03: 40 meq via ORAL
  Filled 2023-06-03 (×2): qty 2

## 2023-06-03 MED ORDER — CLOPIDOGREL BISULFATE 75 MG PO TABS
75.0000 mg | ORAL_TABLET | Freq: Every day | ORAL | 0 refills | Status: AC
  Filled 2023-06-03: qty 30, 30d supply, fill #0

## 2023-06-03 MED ORDER — EMPAGLIFLOZIN 10 MG PO TABS
10.0000 mg | ORAL_TABLET | Freq: Every day | ORAL | 0 refills | Status: AC
  Filled 2023-06-03: qty 30, 30d supply, fill #0

## 2023-06-03 MED ORDER — NITROGLYCERIN 0.4 MG SL SUBL
0.4000 mg | SUBLINGUAL_TABLET | SUBLINGUAL | 0 refills | Status: AC | PRN
  Filled 2023-06-03: qty 25, 7d supply, fill #0

## 2023-06-03 MED ORDER — METOPROLOL SUCCINATE ER 50 MG PO TB24
50.0000 mg | ORAL_TABLET | Freq: Every day | ORAL | Status: DC
Start: 2023-06-03 — End: 2023-06-03

## 2023-06-03 MED ORDER — SACUBITRIL-VALSARTAN 49-51 MG PO TABS
1.0000 | ORAL_TABLET | Freq: Two times a day (BID) | ORAL | 0 refills | Status: DC
Start: 2023-06-03 — End: 2023-08-13
  Filled 2023-06-03: qty 60, 30d supply, fill #0

## 2023-06-03 MED ORDER — ATORVASTATIN CALCIUM 40 MG PO TABS
40.0000 mg | ORAL_TABLET | Freq: Every day | ORAL | 0 refills | Status: AC
  Filled 2023-06-03: qty 30, 30d supply, fill #0

## 2023-06-03 MED ORDER — METOPROLOL SUCCINATE ER 50 MG PO TB24
50.0000 mg | ORAL_TABLET | Freq: Every day | ORAL | 0 refills | Status: AC
  Filled 2023-06-03: qty 30, 30d supply, fill #0

## 2023-06-03 MED ORDER — ASPIRIN 81 MG PO TBEC
81.0000 mg | DELAYED_RELEASE_TABLET | Freq: Every day | ORAL | 0 refills | Status: AC
  Filled 2023-06-03: qty 30, 30d supply, fill #0

## 2023-06-03 NOTE — Plan of Care (Signed)
   Problem: Activity: Goal: Ability to tolerate increased activity will improve Outcome: Progressing

## 2023-06-03 NOTE — Progress Notes (Signed)
   06/03/23 1610  Spiritual Encounters  Type of Visit Follow up  Care provided to: Pt and family  Conversation partners present during encounter Nurse  Reason for visit Advance directives   Reason for Visit: Chaplain responding to page from RN that patient was ready to have ACD paperwork notarized.  Time of Visit: 55 Minutes  Description of Visit: Chaplain arrived in room to find Pt and daughter present. Chaplain inquired about paperwork and Pt provided prepared packet.  Chaplain reviewed the information with the patient and she was ready to proceed.    Chaplain assembled Notary and 2 volunteer witnesses, who then came to the room and witnessed the Pt sign the document.  Witnesses and Notary signed in appropriate places and gave paperwork to chaplain.   Chaplain scanned and faxed paperwork to appropriate address and made 3 copies. The original was returned to Pt.  One copy placed in Pt's paper chart, and 2 copies provided to Pt for the assigned proxies.   Plan of Care: No further chaplain services needed unless called.  Chaplain services remain available by Spiritual Consult or for emergent cases, paging [phone]  Chaplain Raelene Bott, MDiv [email or web address] [phone]

## 2023-06-03 NOTE — Progress Notes (Signed)
 Went over AVS paperwork with patient and daughter, IVS removed, TOC meds given , patient belongings gathered and patient was wheeled out to daughters vehicle.

## 2023-06-03 NOTE — Progress Notes (Signed)
 PT Cancellation Note  Patient Details Name: Anna Klein MRN: [id number] DOB: 03-13-1946   Cancelled Treatment:    Reason Eval/Treat Not Completed: Medical issues which prohibited therapy  Per RN, pt just called for zofran. RN about to administer. Will give it time to work and return for PT eval.    Jerolyn Center, PT Acute Rehabilitation Services  Office [phone]  Zena Amos 06/03/2023, 9:02 AM

## 2023-06-03 NOTE — Telephone Encounter (Signed)
 Patient Product/process development scientist completed.    The patient is insured through [facility]. Patient has Medicare and is not eligible for a copay card, but may be able to apply for patient assistance or Medicare RX Payment Plan (Patient Must reach out to their plan, if eligible for payment plan), if available.    Ran test claim for Entresto 24-26 mg and the current 30 day co-pay is $4.80.  Ran test claim for Farxiga 10 mg and the current 30 day co-pay is $4.80.  Ran test claim for Jardiance 10 mg and the current 30 day co-pay is $4.80.    This test claim was processed through [facility]- copay amounts may vary at other pharmacies due to pharmacy/plan contracts, or as the patient moves through the different stages of their insurance plan.     Roland Earl, CPHT Pharmacy Technician III Certified Patient Advocate [facility] Patient Advocate Team Direct Number: [phone]  Fax: [phone]

## 2023-06-03 NOTE — Discharge Summary (Signed)
 Name: Anna Klein MRN: [id number] DOB: September 02, 1945 78 y.o. PCP: Shellia Cleverly, PA  Date of Admission: 05/31/2023  4:24 PM Date of Discharge: 06/03/2023 06/03/2023 Attending Physician: Dr.  Mercie Eon  DISCHARGE DIAGNOSIS:  Primary Problem: NSTEMI (non-ST elevated myocardial infarction) [facility])   Hospital Problems: Principal Problem:   NSTEMI (non-ST elevated myocardial infarction) (HCC) Active Problems:   DM type 2 (diabetes mellitus, type 2) (HCC)   Chronic diastolic heart failure (HCC)   Primary hypertension   CKD stage 3b, GFR 30-44 ml/min (HCC)   Chronic heart failure with mildly reduced ejection fraction (HFmrEF, 41-49%) (HCC)   HFrEF (heart failure with reduced ejection fraction) (HCC)    DISCHARGE MEDICATIONS:   Allergies as of 06/03/2023       Reactions   Aspirin Other (See Comments)   Due to kidney function   Codeine Nausea Only   Nsaids Other (See Comments)   Cannot take due to kidney function.        Medication List     PAUSE taking these medications    hydrALAZINE 25 MG tablet Wait to take this until your doctor or other care provider tells you to start again. Commonly known as: APRESOLINE Take 1 tablet (25 mg total) by mouth every 8 (eight) hours.       STOP taking these medications    metoprolol tartrate 25 MG tablet Commonly known as: LOPRESSOR       TAKE these medications    aspirin EC 81 MG tablet Take 1 tablet (81 mg total) by mouth daily. Swallow whole. Start taking on: June 04, 2023   atorvastatin 40 MG tablet Commonly known as: LIPITOR Take 1 tablet (40 mg total) by mouth daily. Start taking on: June 04, 2023   brimonidine 0.2 % ophthalmic solution Commonly known as: ALPHAGAN Place 1 drop into both eyes 3 (three) times daily.   clopidogrel 75 MG tablet Commonly known as: PLAVIX Take 1 tablet (75 mg total) by mouth daily with breakfast.   cyanocobalamin 1000 MCG/ML injection Commonly known as: VITAMIN B12 Inject 1 mL  (1,000 mcg total) into the muscle once a week.   dorzolamide-timolol 2-0.5 % ophthalmic solution Commonly known as: COSOPT Place 1 drop into both eyes 2 (two) times daily.   Entresto 49-51 MG Generic drug: sacubitril-valsartan Take 1 tablet by mouth 2 (two) times daily.   famotidine 40 MG tablet Commonly known as: PEPCID Take 40 mg by mouth daily as needed for heartburn.   gabapentin 300 MG capsule Commonly known as: NEURONTIN Take 300 mg by mouth at bedtime. What changed: Another medication with the same name was removed. Continue taking this medication, and follow the directions you see here.   Jardiance 10 MG Tabs tablet Generic drug: empagliflozin Take 1 tablet (10 mg total) by mouth daily.   MAGNESIUM PO Take 500 mg by mouth at bedtime.   melatonin 3 MG Tabs tablet Take 3 mg by mouth at bedtime.   metoprolol succinate 50 MG 24 hr tablet Commonly known as: Toprol XL Take 1 tablet (50 mg total) by mouth daily. Take with or immediately following a meal.   nitroGLYCERIN 0.4 MG SL tablet Commonly known as: Nitrostat Place 1 tablet (0.4 mg total) under the tongue every 5 (five) minutes as needed for chest pain.   potassium chloride 10 MEQ tablet Commonly known as: KLOR-CON Take 20 mEq by mouth every other day.   PreserVision AREDS Caps Take 1 capsule by mouth in the morning and at  bedtime.   Rocklatan 0.02-0.005 % Soln Generic drug: Netarsudil-Latanoprost Place 1 drop into both eyes at bedtime.   torsemide 10 MG tablet Commonly known as: DEMADEX Take 10 mg by mouth daily.   VITAMIN A PO Take 1 capsule by mouth daily.   VITAMIN C PO Take 1 tablet by mouth daily.   VITAMIN D (CHOLECALCIFEROL) PO Take 1 capsule by mouth daily.   ZINC PO Take 1 capsule by mouth daily.        DISPOSITION AND FOLLOW-UP:  Ms.Donnica Funk was discharged from [facility] in Stable condition. At the hospital follow up visit please address:  NSTEMI Ensure  compliance with ASA, Plavix, Lipitor  HFmrEF Entresto held at discharge given elevated creatinine. Get BMP in one week and consider resuming as patient will need this per GDMT. Assess for symptoms of UTI after starting Jardiance  HTN Hydralazine held during admission and upon discharge. Assess need going forward.    HOSPITAL COURSE:  Patient Summary:  NSTEMI Presented with epigastric pain and SOB. EKG showed sinus tachycardia. Troponin elevated 40> 638 > 2,210 > 1, 831. Cardiology proceeded with LHC which did not show evidence of significant obstruction. Recommendation was for medical management and patient started on ASA, Plavix, and Lipitor in addition to continuing BB (changed to Toprol-XL per below). Patient denied epigastric/chest pain during admission and remained HDS throughout. She will follow-up with PCP and Cardiology will be reaching out regarding follow-up as well.  HFmrEF TTE this admission showed EF of 45-50% which was reduced from prior TTE in 2016. There were no overt signs of volume overload. Per Cardiology, patient started on Entresto, Jardiance, and home Lopressor exchanged for Toprol-XL. BMP showed mild elevation in creatinine on day of discharge, so Entresto held with plan for BMP in one week to assess kidney function. Plan to resume will be based on BMP. She was also continued on home Torsemide.  HTN Home Hydralazine held during admission and patient's BP was stable throughout. It will be held upon discharge until follow-up with PCP.  DISCHARGE INSTRUCTIONS:   Discharge Instructions     AMB referral to Phase II Cardiac Rehabilitation   Complete by: As directed    Diagnosis: NSTEMI   After initial evaluation and assessments completed: Virtual Based Care may be provided alone or in conjunction with Phase 2 Cardiac Rehab based on patient barriers.: Yes   Intensive Cardiac Rehabilitation (ICR) [facility] location only OR Traditional Cardiac Rehabilitation (TCR) *If criteria for  ICR are not met will enroll in TCR [facility] only): Yes   Diet - low sodium heart healthy   Complete by: As directed    Discharge instructions   Complete by: As directed    You were admitted to the hospital for a heart attack and thankfully the heart catheterization did not show evidence of a blockage large enough to require surgical intervention. The treatment for this will be medications. The ultrasound of your heart did show that it is not pumping quite as well so we will start you on medications to help with this as well. We will provide all of these to you upon discharge, but please DO NOT take your Entresto until you get lab work (Basic Metabolic Panel) in approximately one week. Your PCP can instruct you on what to do with Entresto based on this lab work. Please make sure and follow-up with your PCP and our Cardiology team will be reaching out to you regarding follow-up too. Please return to the ED  if you begin to experience significant chest pain and/or shortness of breath.   Increase activity slowly   Complete by: As directed        SUBJECTIVE:  Endorses mild nausea without emesis. Denies chest pain/palpitations, SOB. Discharge Vitals:   BP (!) 111/54 (BP Location: Left Arm)   Pulse (!) 53   Temp 98.7 F (37.1 C) (Oral)   Resp 19   Ht 5\' 1"  (1.549 m)   Wt 73 kg   SpO2 92%   BMI 30.41 kg/m   OBJECTIVE:  Constitutional: well-appearing, lying in bed, in no acute distress Cardiovascular: regular rate and rhythm, no m/r/g, no LEE Pulmonary/Chest: normal work of breathing on room air, lungs clear to auscultation bilaterally Skin: warm and dry Psych: normal mood and behavior   Pertinent Labs, Studies, and Procedures:     Latest Ref Rng & Units 06/02/2023    2:19 AM 06/01/2023    1:13 AM 05/31/2023    4:40 PM  CBC  WBC 4.0 - 10.5 K/uL 5.7  9.9    Hemoglobin 12.0 - 15.0 g/dL 09.8  11.9  14.7   Hematocrit 36.0 - 46.0 % 37.5  38.0  41.0   Platelets 150 - 400 K/uL 152  186          Latest Ref Rng & Units 06/03/2023    2:21 AM 06/02/2023    2:19 AM 06/01/2023    1:13 AM  CMP  Glucose 70 - 99 mg/dL 829  562  130   BUN 8 - 23 mg/dL 14  12  13    Creatinine 0.44 - 1.00 mg/dL 8.65  7.84  6.96   Sodium 135 - 145 mmol/L 140  141  142   Potassium 3.5 - 5.1 mmol/L 3.2  3.8  3.7   Chloride 98 - 111 mmol/L 106  107  106   CO2 22 - 32 mmol/L 24  25  22    Calcium 8.9 - 10.3 mg/dL 8.7  8.8  8.9     ECHOCARDIOGRAM COMPLETE Result Date: 06/01/2023    ECHOCARDIOGRAM REPORT   Patient Name:   TIMERA Ambers Date of Exam: 06/01/2023 Medical Rec #:  [id number]    Height:       61.0 in Accession #:    [id number]   Weight:       160.9 lb Date of Birth:  1946/01/12   BSA:          1.722 m Patient Age:    77 years     BP:           103/66 mmHg Patient Gender: F            HR:           70 bpm. Exam Location:  Inpatient Procedure: 2D Echo, 3D Echo, Cardiac Doppler and Color Doppler (Both Spectral            and Color Flow Doppler were utilized during procedure). Indications:    NSTEMI  History:        Patient has prior history of Echocardiogram examinations, most                 recent 03/28/2014. Risk Factors:Diabetes and Hypertension. CKD                 stage 3.  Sonographer:    Vern Claude Referring Phys: [id number] KAMAL H HENDERSON IMPRESSIONS  1. Left ventricular ejection fraction, by estimation, is 45 to 50%. Left ventricular ejection  fraction by 3D volume is 46 %. The left ventricle has mildly decreased function. The left ventricle demonstrates global hypokinesis. Left ventricular diastolic  parameters are indeterminate.  2. Right ventricular systolic function is normal. The right ventricular size is normal. Tricuspid regurgitation signal is inadequate for assessing PA pressure.  3. The mitral valve is normal in structure. No evidence of mitral valve regurgitation. No evidence of mitral stenosis.  4. The aortic valve is normal in structure. Aortic valve regurgitation is not visualized. Aortic valve  sclerosis is present, with no evidence of aortic valve stenosis.  5. The inferior vena cava is normal in size with greater than 50% respiratory variability, suggesting right atrial pressure of 3 mmHg. FINDINGS  Left Ventricle: Left ventricular ejection fraction, by estimation, is 45 to 50%. Left ventricular ejection fraction by 3D volume is 46 %. The left ventricle has mildly decreased function. The left ventricle demonstrates global hypokinesis. The left ventricular internal cavity size was normal in size. There is no left ventricular hypertrophy. Left ventricular diastolic parameters are indeterminate. Right Ventricle: The right ventricular size is normal. No increase in right ventricular wall thickness. Right ventricular systolic function is normal. Tricuspid regurgitation signal is inadequate for assessing PA pressure. Left Atrium: Left atrial size was normal in size. Right Atrium: Right atrial size was normal in size. Pericardium: There is no evidence of pericardial effusion. Presence of epicardial fat layer. Mitral Valve: The mitral valve is normal in structure. No evidence of mitral valve regurgitation. No evidence of mitral valve stenosis. MV peak gradient, 3.4 mmHg. The mean mitral valve gradient is 1.0 mmHg. Tricuspid Valve: The tricuspid valve is normal in structure. Tricuspid valve regurgitation is not demonstrated. No evidence of tricuspid stenosis. Aortic Valve: The aortic valve is normal in structure. Aortic valve regurgitation is not visualized. Aortic valve sclerosis is present, with no evidence of aortic valve stenosis. Aortic valve mean gradient measures 5.0 mmHg. Aortic valve peak gradient measures 9.0 mmHg. Aortic valve area, by VTI measures 1.60 cm. Pulmonic Valve: The pulmonic valve was normal in structure. Pulmonic valve regurgitation is not visualized. No evidence of pulmonic stenosis. Aorta: The aortic root is normal in size and structure. Venous: The inferior vena cava is normal in size  with greater than 50% respiratory variability, suggesting right atrial pressure of 3 mmHg. IAS/Shunts: No atrial level shunt detected by color flow Doppler. Additional Comments: 3D was performed not requiring image post processing on an independent workstation and was abnormal.  LEFT VENTRICLE PLAX 2D LVIDd:         3.80 cm         Diastology LVIDs:         2.90 cm         LV e' medial:    5.00 cm/s LV PW:         0.80 cm         LV E/e' medial:  17.7 LV IVS:        0.80 cm         LV e' lateral:   4.68 cm/s LVOT diam:     1.90 cm         LV E/e' lateral: 18.9 LV SV:         47 LV SV Index:   27 LVOT Area:     2.84 cm        3D Volume EF  LV 3D EF:    Left                                             ventricul LV Volumes (MOD)                            ar LV vol d, MOD    118.0 ml                   ejection A2C:                                        fraction LV vol d, MOD    93.3 ml                    by 3D A4C:                                        volume is LV vol s, MOD    55.4 ml                    46 %. A2C: LV vol s, MOD    49.0 ml A4C:                           3D Volume EF: LV SV MOD A2C:   62.6 ml       3D EF:        46 % LV SV MOD A4C:   93.3 ml LV SV MOD BP:    52.4 ml RIGHT VENTRICLE             IVC RV Basal diam:  3.30 cm     IVC diam: 1.60 cm RV Mid diam:    1.90 cm RV S prime:     13.30 cm/s TAPSE (M-mode): 2.4 cm LEFT ATRIUM             Index        RIGHT ATRIUM           Index LA diam:        3.30 cm 1.92 cm/m   RA Area:     12.50 cm LA Vol (A2C):   55.8 ml 32.40 ml/m  RA Volume:   27.50 ml  15.97 ml/m LA Vol (A4C):   34.2 ml 19.86 ml/m LA Biplane Vol: 48.2 ml 27.99 ml/m  AORTIC VALVE                     PULMONIC VALVE AV Area (Vmax):    1.42 cm      PV Vmax:       0.79 m/s AV Area (Vmean):   1.45 cm      PV Peak grad:  2.5 mmHg AV Area (VTI):     1.60 cm AV Vmax:           150.00 cm/s AV Vmean:          100.000 cm/s AV VTI:            0.294 m AV Peak Grad:  9.0 mmHg AV Mean Grad:      5.0 mmHg LVOT Vmax:         75.00 cm/s LVOT Vmean:        51.300 cm/s LVOT VTI:          0.166 m LVOT/AV VTI ratio: 0.56  AORTA Ao Root diam: 2.90 cm Ao Asc diam:  2.70 cm MITRAL VALVE MV Area (PHT): 3.85 cm    SHUNTS MV Area VTI:   1.86 cm    Systemic VTI:  0.17 m MV Peak grad:  3.4 mmHg    Systemic Diam: 1.90 cm MV Mean grad:  1.0 mmHg MV Vmax:       0.93 m/s MV Vmean:      54.3 cm/s MV Decel Time: 197 msec MV E velocity: 88.60 cm/s MV A velocity: 48.70 cm/s MV E/A ratio:  1.82 Kardie Tobb DO Electronically signed by Thomasene Ripple DO Signature Date/Time: 06/01/2023/3:30:52 PM    Final    CT Angio Chest/Abd/Pel for Dissection W and/or W/WO Result Date: 05/31/2023 CLINICAL DATA:  Upper abdominal pain, nausea, vomiting. Shoulder pain. Evaluate for acute aortic syndrome EXAM: CT ANGIOGRAPHY CHEST, ABDOMEN AND PELVIS TECHNIQUE: Non-contrast CT of the chest was initially obtained. Multidetector CT imaging through the chest, abdomen and pelvis was performed using the standard protocol during bolus administration of intravenous contrast. Multiplanar reconstructed images and MIPs were obtained and reviewed to evaluate the vascular anatomy. RADIATION DOSE REDUCTION: This exam was performed according to the departmental dose-optimization program which includes automated exposure control, adjustment of the mA and/or kV according to patient size and/or use of iterative reconstruction technique. CONTRAST:  75mL OMNIPAQUE IOHEXOL 350 MG/ML SOLN COMPARISON:  05/08/2015 FINDINGS: CTA CHEST FINDINGS Cardiovascular: Heart is normal size. Aorta is normal caliber. No evidence of aortic dissection. No filling defects in the pulmonary arteries to suggest pulmonary emboli. Scattered coronary artery and aortic atherosclerosis. Mediastinum/Nodes: No mediastinal, hilar, or axillary adenopathy. Trachea and esophagus are unremarkable. Thyroid unremarkable. Large hiatal hernia. Lungs/Pleura: Compressive  atelectasis in the lower lobes adjacent to the large hiatal hernia. No confluent airspace opacities or effusions. Musculoskeletal: Chest wall soft tissues are unremarkable. No acute bony abnormality. Review of the MIP images confirms the above findings. CTA ABDOMEN AND PELVIS FINDINGS VASCULAR Aorta: Normal caliber aorta without aneurysm, dissection, vasculitis or significant stenosis. Aortic atherosclerosis. Celiac: Patent without evidence of aneurysm, dissection, vasculitis or significant stenosis. Calcified plaque at the origin. SMA: Patent without evidence of aneurysm, dissection, vasculitis or significant stenosis. Renals: Suspect mild stenosis at the origin of the left renal artery. No aneurysm, dissection, or evidence of vasculitis. IMA: Patent. Inflow: Patent without evidence of aneurysm, dissection, vasculitis or significant stenosis. Atherosclerotic calcifications. Veins: No obvious venous abnormality within the limitations of this arterial phase study. Review of the MIP images confirms the above findings. NON-VASCULAR Hepatobiliary: Diffuse low-density throughout the liver compatible with fatty infiltration. Prior cholecystectomy. Pancreas: No focal abnormality or ductal dilatation. Spleen: No focal abnormality.  Normal size. Adrenals/Urinary Tract: No adrenal abnormality. No focal renal abnormality. No stones or hydronephrosis. Urinary bladder is unremarkable. Stomach/Bowel: Sigmoid diverticulosis. No active diverticulitis. Stomach and small bowel decompressed, unremarkable. Normal appendix. Lymphatic: No adenopathy Reproductive: Uterus and adnexa unremarkable.  No mass. Other: No free fluid or free air. Musculoskeletal: No acute bony abnormality. Review of the MIP images confirms the above findings. IMPRESSION: No evidence of aortic aneurysm or dissection. Scattered coronary artery and aortic atherosclerosis. No evidence of pulmonary embolus. Large hiatal hernia. Hepatic steatosis. Sigmoid  diverticulosis. No acute findings in the chest, abdomen or pelvis. Electronically Signed   By: Charlett Nose M.D.   On: 05/31/2023 18:36   DG Chest Port 1 View Result Date: 05/31/2023 CLINICAL DATA:  Chest pain. EXAM: PORTABLE CHEST 1 VIEW COMPARISON:  03/26/2017. FINDINGS: Low lung volume. There are probable atelectatic changes at the lung bases. Bilateral lung fields are otherwise clear. No dense consolidation or lung collapse. Bilateral costophrenic angles are clear. Mildly enlarged cardio-mediastinal silhouette. Retrocardiac moderate-sized hiatal hernia noted. No acute osseous abnormalities. Lower cervical spinal fixation hardware noted. The soft tissues are within normal limits. IMPRESSION: No active disease. Retrocardiac moderate-sized hiatal hernia. Electronically Signed   By: Jules Schick M.D.   On: 05/31/2023 16:55     Signed: Carmina Miller, DO  Internal Medicine Resident, PGY-1 [facility]  Pager: [phone] 1:11 PM, 06/03/2023

## 2023-06-03 NOTE — Evaluation (Signed)
 Physical Therapy Evaluation and Discharge Patient Details Name: Anna Klein MRN: [id number] DOB: August 17, 1945 Today's Date: 06/03/2023  History of Present Illness  78 year old female who presented 05/31/23 for nausea and chest pain found to have an NSTEMI. 06/02/23 L heart cath via R radial access; PMH chronic diastolic congestive heart failure, hypertension, macular degeneration, GBS, DM, CKD  Clinical Impression   Patient evaluated by Physical Therapy with no further acute PT needs identified. All education has been completed and the patient has no further questions. Patient doing an excellent job of not using RUE as she is still <24 hrs from radial band removal. No physical assist needed.  PT is signing off. Thank you for this referral.         If plan is discharge home, recommend the following: Assistance with cooking/housework;Assist for transportation   Can travel by private vehicle        Equipment Recommendations None recommended by PT  Recommendations for Other Services       Functional Status Assessment Patient has not had a recent decline in their functional status     Precautions / Restrictions Precautions Precautions: Other (comment) Precaution/Restrictions Comments: post R radial cath Restrictions Weight Bearing Restrictions Per Provider Order: No      Mobility  Bed Mobility Overal bed mobility: Needs Assistance Bed Mobility: Rolling, Sidelying to Sit, Sit to Sidelying Rolling: Supervision Sidelying to sit: Supervision     Sit to sidelying: Supervision General bed mobility comments: vc for bed mobility to limit use of RUE; pt reports she will likely sleep in recliner and can activate with her LUE    Transfers Overall transfer level: Independent Equipment used: None                    Ambulation/Gait Ambulation/Gait assistance: Independent Gait Distance (Feet): 80 Feet Assistive device: None Gait Pattern/deviations: WFL(Within Functional  Limits)   Gait velocity interpretation: >2.62 ft/sec, indicative of community ambulatory   General Gait Details: no issues  Stairs            Wheelchair Mobility     Tilt Bed    Modified Rankin (Stroke Patients Only)       Balance Overall balance assessment: Independent                                           Pertinent Vitals/Pain Pain Assessment Pain Assessment: Faces Faces Pain Scale: Hurts little more Pain Location: Left wrist (blood draw site) Pain Descriptors / Indicators: Aching, Guarding Pain Intervention(s): Limited activity within patient's tolerance, Monitored during session    Home Living Family/patient expects to be discharged to:: Private residence Living Arrangements: Alone Available Help at Discharge: Family;Available 24 hours/day (going to stay with her daughter) Type of Home: House Home Access: Stairs to enter Entrance Stairs-Rails: None Entrance Stairs-Number of Steps: 1   Home Layout: One level Home Equipment: Agricultural consultant (2 wheels);Cane - single point      Prior Function Prior Level of Function : Independent/Modified Independent;Working/employed;Driving             Mobility Comments: not using device; 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       Extremity/Trunk Assessment   Upper Extremity Assessment Upper Extremity Assessment: Overall WFL for tasks assessed (RLE limited to post-radial artery access restriction <24 hours)    Lower Extremity Assessment Lower Extremity Assessment: Overall WFL for  tasks assessed    Cervical / Trunk Assessment Cervical / Trunk Assessment: Normal  Communication   Communication Communication: No apparent difficulties    Cognition Arousal: Alert Behavior During Therapy: WFL for tasks assessed/performed   PT - Cognitive impairments: No apparent impairments                         Following commands: Intact       Cueing Cueing Techniques: Verbal cues      General Comments General comments (skin integrity, edema, etc.): HR 73-93 with ambulation    Exercises     Assessment/Plan    PT Assessment Patient does not need any further PT services  PT Problem List         PT Treatment Interventions      PT Goals (Current goals can be found in the Care Plan section)  Acute Rehab PT Goals PT Goal Formulation: All assessment and education complete, DC therapy    Frequency       Co-evaluation               AM-PAC PT "6 Clicks" Mobility  Outcome Measure Help needed turning from your back to your side while in a flat bed without using bedrails?: None Help needed moving from lying on your back to sitting on the side of a flat bed without using bedrails?: None Help needed moving to and from a bed to a chair (including a wheelchair)?: None Help needed standing up from a chair using your arms (e.g., wheelchair or bedside chair)?: None Help needed to walk in hospital room?: None Help needed climbing 3-5 steps with a railing? : None 6 Click Score: 24    End of Session Equipment Utilized During Treatment: Gait belt Activity Tolerance: Patient tolerated treatment well Patient left: in bed;with call bell/phone within reach;with family/visitor present Nurse Communication: Mobility status;Other (comment) (no PT or DME needs) PT Visit Diagnosis: Difficulty in walking, not elsewhere classified (R26.2)    Time: 9629-5284 PT Time Calculation (min) (ACUTE ONLY): 17 min   Charges:   PT Evaluation $PT Eval Low Complexity: 1 Low   PT General Charges $$ ACUTE PT VISIT: 1 Visit          Anna Klein, PT Acute Rehabilitation Services  Office [phone]   Zena Amos 06/03/2023, 10:24 AM

## 2023-06-03 NOTE — Progress Notes (Signed)
   Patient Name: Anna Klein Date of Encounter: 06/03/2023 [facility] Cardiologist: Graciela Husbands  Interval Summary  .    Cath yesterday with non obstructive disease >> med therapy.  No complaints today, doing well.  Vital Signs .    Vitals:   06/02/23 2136 06/02/23 2300 06/03/23 0300 06/03/23 0808  BP: (!) 126/51 (!) 109/56 120/60 116/69  Pulse: 62 60 (!) 53   Resp:  16 18 (!) 24  Temp:  97.6 F (36.4 C) 98.4 F (36.9 C) 98.4 F (36.9 C)  TempSrc:  Oral Oral Oral  SpO2:  95% 92% 92%  Weight:      Height:        Intake/Output Summary (Last 24 hours) at 06/03/2023 0906 Last data filed at 06/02/2023 0935 Gross per 24 hour  Intake --  Output 100 ml  Net -100 ml      06/01/2023   12:52 AM 05/31/2023    9:35 PM 05/08/2015   12:24 PM  Last 3 Weights  Weight (lbs) 160 lb 15 oz 160 lb 15 oz 159 lb  Weight (kg) 73 kg 73 kg 72.122 kg      Telemetry/ECG    SR with occ PVCs - Personally Reviewed  Physical Exam .   GEN: No acute distress.   Neck: No JVD Cardiac: RRR, no murmurs, rubs, or gallops.  Respiratory: Clear to auscultation bilaterally. GI: Soft, nontender, non-distended  MS: No edema  Assessment & Plan .     ACS:  No high grade obstructive disease on cath yesterday with LVEDP 11.  Cont DAPT with plavix x 6 months. DM:  Diet controlled, cont ASA 81, atorvastatin 80.  Continue Jardiance 10mg  Qday and Entresto 49/51 bid HTN:  Start ARB prior to discharge. NICM:  EF 40-50%.  Change lopressor to to Toprol XL 50mg  QPM, cont Jardiance 10mg  qday, and Entresto 49/51 bid  Will arrange cardiology follow up.    For questions or updates, please contact [facility] Please consult [email or web address] for contact info under        Signed, Orbie Pyo, MD

## 2023-06-04 LAB — LIPOPROTEIN A (LPA): Lipoprotein (a): 10.4 nmol/L (ref <75.0)

## 2023-06-16 NOTE — Progress Notes (Unsigned)
 Cardiology Office Note    Patient Name: Anna Klein Date of Encounter: 06/16/2023  Primary Care Provider:  Shellia Cleverly, PA Primary Cardiologist:  None Primary Electrophysiologist: None   Past Medical History    Past Medical History:  Diagnosis Date   DM (diabetes mellitus) (HCC)    GBS (Guillain Barre syndrome) (HCC)    Hypertension    Renal disorder    Stroke [facility])     History of Present Illness  Anna Klein is a 78 y.o. female with a PMH of CAD s/p NSTEMI, NICM, HLD,HFmrEF, Guillain-Barr syndrome, LBBB DM type II, HTN who presents today for posthospital follow-up.  Anna Klein was seen on 05/31/2023 via EMS with complaint of severe and sudden abdominal pain that occurred after eating spaghetti.  EKG was completed showing borderline ST depression with CTA completed showing no evidence of dissection or PE.  She was found to have elevated troponin at 638 with eventual peaked at 2210 and was started on heparin drip and given high-dose aspirin.  She was admitted and evaluated by cardiology with 2D echo completed that showed EF of 45-50% with global hypokinesis and no significant valve abnormalities she underwent LHC that showed no significant obstruction and mild diffuse proximal coronary calcifications with TIMI flow of 3 and minimal disease of mid circumflex.  She was started on Entresto at discharge as well as Jardiance and had metoprolol switched for Toprol-XL.  She was continued on home hydralazine and was started on DAPT with ASA and Plavix as well as Lipitor.  Patient denies chest pain, palpitations, dyspnea, PND, orthopnea, nausea, vomiting, dizziness, syncope, edema, weight gain, or early satiety.   Discussed the use of AI scribe software for clinical note transcription with the patient, who gave verbal consent to proceed.  History of Present Illness    ***Notes: -Last ischemic evaluation:  Review of Systems  Please see the history of present illness.    All other  systems reviewed and are otherwise negative except as noted above.  Physical Exam    Wt Readings from Last 3 Encounters:  06/01/23 160 lb 15 oz (73 kg)  05/08/15 159 lb (72.1 kg)  03/31/14 140 lb 6.4 oz (63.7 kg)   NF:AOZHY were no vitals filed for this visit.,There is no height or weight on file to calculate BMI. GEN: Well nourished, well developed in no acute distress Neck: No JVD; No carotid bruits Pulmonary: Clear to auscultation without rales, wheezing or rhonchi  Cardiovascular: Normal rate. Regular rhythm. Normal S1. Normal S2.   Murmurs: There is no murmur.  ABDOMEN: Soft, non-tender, non-distended EXTREMITIES:  No edema; No deformity   EKG/LABS/ Recent Cardiac Studies   ECG personally reviewed by me today - ***  Risk Assessment/Calculations:   {Does this patient have ATRIAL FIBRILLATION?:[phone]}      Lab Results  Component Value Date   WBC 5.7 06/02/2023   HGB 12.3 06/02/2023   HCT 37.5 06/02/2023   MCV 88.9 06/02/2023   PLT 152 06/02/2023   Lab Results  Component Value Date   CREATININE 1.46 (H) 06/03/2023   BUN 14 06/03/2023   NA 140 06/03/2023   K 3.2 (L) 06/03/2023   CL 106 06/03/2023   CO2 24 06/03/2023   Lab Results  Component Value Date   CHOL 177 06/01/2023   HDL 59 06/01/2023   LDLCALC 94 06/01/2023   TRIG 120 06/01/2023   CHOLHDL 3.0 06/01/2023    Lab Results  Component Value Date   HGBA1C 6.4 (H)  03/27/2014   Assessment & Plan    1.  History of CAD:  2.HFmrEF:  3.  Essential hypertension:  4.  Hyperlipidemia:  5.  CKD stage IIIb:      Disposition: Follow-up with None or APP in *** months {Are you ordering a CV Procedure (e.g. stress test, cath, DCCV, TEE, etc)?   Press F2        :[id number]}   Signed, Napoleon Form, Leodis Rains, NP 06/16/2023, 7:43 AM [facility]

## 2023-06-17 ENCOUNTER — Ambulatory Visit: Attending: Nurse Practitioner | Admitting: Nurse Practitioner

## 2023-06-17 ENCOUNTER — Encounter: Payer: Self-pay | Admitting: Nurse Practitioner

## 2023-06-17 VITALS — BP 124/62 | HR 63 | Ht 61.0 in | Wt 162.2 lb

## 2023-06-17 DIAGNOSIS — I2510 Atherosclerotic heart disease of native coronary artery without angina pectoris: Secondary | ICD-10-CM | POA: Diagnosis not present

## 2023-06-17 DIAGNOSIS — E785 Hyperlipidemia, unspecified: Secondary | ICD-10-CM | POA: Diagnosis not present

## 2023-06-17 DIAGNOSIS — I5022 Chronic systolic (congestive) heart failure: Secondary | ICD-10-CM | POA: Diagnosis not present

## 2023-06-17 DIAGNOSIS — N1832 Chronic kidney disease, stage 3b: Secondary | ICD-10-CM

## 2023-06-17 DIAGNOSIS — I10 Essential (primary) hypertension: Secondary | ICD-10-CM

## 2023-06-17 MED ORDER — PANTOPRAZOLE SODIUM 20 MG PO TBEC: 20.0000 mg | DELAYED_RELEASE_TABLET | Freq: Every day | ORAL | 4 refills | Status: DC

## 2023-06-17 NOTE — Patient Instructions (Addendum)
 Medication Instructions:  STOP Pepcid START Protonix 20mg  Take 1 tablet twice a day  CAN take an additional 10mg  of Torsemide as needed for weight gain  *If you need a refill on your cardiac medications before your next appointment, please call your pharmacy*  Lab Work: None ordered If you have labs (blood work) drawn today and your tests are completely normal, you will receive your results only by: MyChart Message (if you have MyChart) OR A paper copy in the mail If you have any lab test that is abnormal or we need to change your treatment, we will call you to review the results.  Testing/Procedures: None ordered  Follow-Up: At [facility], you and your health needs are our priority.  As part of our continuing mission to provide you with exceptional heart care, our providers are all part of one team.  This team includes your primary Cardiologist (physician) and Advanced Practice Providers or APPs (Physician Assistants and Nurse Practitioners) who all work together to provide you with the care you need, when you need it.  Your next appointment:   3 month(s)  Provider:   Robin Searing, NP       We recommend signing up for the patient portal called "MyChart".  Sign up information is provided on this After Visit Summary.  MyChart is used to connect with patients for Virtual Visits (Telemedicine).  Patients are able to view lab/test results, encounter notes, upcoming appointments, etc.  Non-urgent messages can be sent to your provider as well.   To learn more about what you can do with MyChart, go to [email or web address].   Other Instructions You have been referred to Gastroenterology. Please get some ted hose or compression stockings. They can be purchased at your local medical supply store, [employer], [employer] or Char[employer] fundraiser.  Put them on first thing in the morning and wear them during the day . Elevate your feet during the day and remove hose in the evening before bed.        1st Floor: - Lobby - Registration  - Pharmacy  - Lab - Cafe  2nd Floor: - PV Lab - Diagnostic Testing (echo, CT, nuclear med)  3rd Floor: - Vacant  4th Floor: - TCTS (cardiothoracic surgery) - AFib Clinic - Structural Heart Clinic - Vascular Surgery  - Vascular Ultrasound  5th Floor: - HeartCare Cardiology (general and EP) - Clinical Pharmacy for coumadin, hypertension, lipid, weight-loss medications, and med management appointments    Valet parking services will be available as well.

## 2023-08-08 ENCOUNTER — Other Ambulatory Visit: Payer: Self-pay

## 2023-08-08 ENCOUNTER — Inpatient Hospital Stay (HOSPITAL_COMMUNITY)
Admission: EM | Admit: 2023-08-08 | Discharge: 2023-08-13 | DRG: 281 | Disposition: A | Discharge status: Home / Self Care | Attending: Family Medicine | Admitting: Family Medicine

## 2023-08-08 ENCOUNTER — Encounter (HOSPITAL_COMMUNITY): Payer: Self-pay | Admitting: Emergency Medicine

## 2023-08-08 ENCOUNTER — Emergency Department (HOSPITAL_COMMUNITY)

## 2023-08-08 DIAGNOSIS — Z8673 Personal history of transient ischemic attack (TIA), and cerebral infarction without residual deficits: Secondary | ICD-10-CM

## 2023-08-08 DIAGNOSIS — Z7982 Long term (current) use of aspirin: Secondary | ICD-10-CM

## 2023-08-08 DIAGNOSIS — I447 Left bundle-branch block, unspecified: Secondary | ICD-10-CM | POA: Diagnosis present

## 2023-08-08 DIAGNOSIS — I130 Hypertensive heart and chronic kidney disease with heart failure and stage 1 through stage 4 chronic kidney disease, or unspecified chronic kidney disease: Secondary | ICD-10-CM | POA: Diagnosis present

## 2023-08-08 DIAGNOSIS — Z79899 Other long term (current) drug therapy: Secondary | ICD-10-CM

## 2023-08-08 DIAGNOSIS — R072 Precordial pain: Secondary | ICD-10-CM | POA: Diagnosis not present

## 2023-08-08 DIAGNOSIS — I5022 Chronic systolic (congestive) heart failure: Secondary | ICD-10-CM | POA: Diagnosis present

## 2023-08-08 DIAGNOSIS — Z8669 Personal history of other diseases of the nervous system and sense organs: Secondary | ICD-10-CM

## 2023-08-08 DIAGNOSIS — I214 Non-ST elevation (NSTEMI) myocardial infarction: Principal | ICD-10-CM | POA: Diagnosis present

## 2023-08-08 DIAGNOSIS — Z7984 Long term (current) use of oral hypoglycemic drugs: Secondary | ICD-10-CM

## 2023-08-08 DIAGNOSIS — Z7902 Long term (current) use of antithrombotics/antiplatelets: Secondary | ICD-10-CM

## 2023-08-08 DIAGNOSIS — E785 Hyperlipidemia, unspecified: Secondary | ICD-10-CM | POA: Diagnosis present

## 2023-08-08 DIAGNOSIS — E669 Obesity, unspecified: Secondary | ICD-10-CM | POA: Diagnosis present

## 2023-08-08 DIAGNOSIS — K219 Gastro-esophageal reflux disease without esophagitis: Secondary | ICD-10-CM | POA: Diagnosis present

## 2023-08-08 DIAGNOSIS — E876 Hypokalemia: Secondary | ICD-10-CM | POA: Diagnosis present

## 2023-08-08 DIAGNOSIS — Z6831 Body mass index (BMI) 31.0-31.9, adult: Secondary | ICD-10-CM

## 2023-08-08 DIAGNOSIS — I25110 Atherosclerotic heart disease of native coronary artery with unstable angina pectoris: Secondary | ICD-10-CM | POA: Diagnosis present

## 2023-08-08 DIAGNOSIS — Z8249 Family history of ischemic heart disease and other diseases of the circulatory system: Secondary | ICD-10-CM

## 2023-08-08 DIAGNOSIS — I428 Other cardiomyopathies: Secondary | ICD-10-CM | POA: Diagnosis present

## 2023-08-08 DIAGNOSIS — R079 Chest pain, unspecified: Secondary | ICD-10-CM | POA: Diagnosis present

## 2023-08-08 DIAGNOSIS — N1832 Chronic kidney disease, stage 3b: Secondary | ICD-10-CM | POA: Diagnosis present

## 2023-08-08 DIAGNOSIS — Z886 Allergy status to analgesic agent status: Secondary | ICD-10-CM

## 2023-08-08 DIAGNOSIS — I255 Ischemic cardiomyopathy: Secondary | ICD-10-CM | POA: Diagnosis present

## 2023-08-08 DIAGNOSIS — I252 Old myocardial infarction: Secondary | ICD-10-CM

## 2023-08-08 DIAGNOSIS — E1122 Type 2 diabetes mellitus with diabetic chronic kidney disease: Secondary | ICD-10-CM | POA: Diagnosis present

## 2023-08-08 LAB — CBC
HCT: 45.1 % (ref 36.0–46.0)
Hemoglobin: 14.5 g/dL (ref 12.0–15.0)
MCH: 28.8 pg (ref 26.0–34.0)
MCHC: 32.2 g/dL (ref 30.0–36.0)
MCV: 89.7 fL (ref 80.0–100.0)
Platelets: 164 10*3/uL (ref 150–400)
RBC: 5.03 MIL/uL (ref 3.87–5.11)
RDW: 14.2 % (ref 11.5–15.5)
WBC: 8.6 10*3/uL (ref 4.0–10.5)
nRBC: 0 % (ref 0.0–0.2)

## 2023-08-08 LAB — COMPREHENSIVE METABOLIC PANEL WITH GFR
ALT: 16 U/L (ref 0–44)
AST: 33 U/L (ref 15–41)
Albumin: 4.3 g/dL (ref 3.5–5.0)
Alkaline Phosphatase: 102 U/L (ref 38–126)
Anion gap: 17 — ABNORMAL HIGH (ref 5–15)
BUN: 17 mg/dL (ref 8–23)
CO2: 22 mmol/L (ref 22–32)
Calcium: 9.6 mg/dL (ref 8.9–10.3)
Chloride: 103 mmol/L (ref 98–111)
Creatinine, Ser: 1.5 mg/dL — ABNORMAL HIGH (ref 0.44–1.00)
GFR, Estimated: 35 mL/min — ABNORMAL LOW (ref >60)
Glucose, Bld: 188 mg/dL — ABNORMAL HIGH (ref 70–99)
Potassium: 4 mmol/L (ref 3.5–5.1)
Sodium: 142 mmol/L (ref 135–145)
Total Bilirubin: 1.6 mg/dL — ABNORMAL HIGH (ref 0.0–1.2)
Total Protein: 6.8 g/dL (ref 6.5–8.1)

## 2023-08-08 LAB — LIPASE, BLOOD: Lipase: 45 U/L (ref 11–51)

## 2023-08-08 LAB — TROPONIN I (HIGH SENSITIVITY): Troponin I (High Sensitivity): 27 ng/L — ABNORMAL HIGH (ref <18)

## 2023-08-08 MED ORDER — ONDANSETRON 4 MG PO TBDP
4.0000 mg | ORAL_TABLET | Freq: Once | ORAL | Status: AC
  Administered 2023-08-08: 4 mg via ORAL
  Filled 2023-08-08: qty 1

## 2023-08-08 MED ORDER — FENTANYL CITRATE PF 50 MCG/ML IJ SOSY
50.0000 ug | PREFILLED_SYRINGE | Freq: Once | INTRAMUSCULAR | Status: AC
  Administered 2023-08-08: 50 ug via INTRAVENOUS
  Filled 2023-08-08: qty 1

## 2023-08-08 MED ORDER — OXYCODONE HCL 5 MG PO TABS
5.0000 mg | ORAL_TABLET | Freq: Once | ORAL | Status: AC
  Administered 2023-08-08: 5 mg via ORAL
  Filled 2023-08-08: qty 1

## 2023-08-08 MED ORDER — TRIMETHOBENZAMIDE HCL 100 MG/ML IM SOLN
100.0000 mg | Freq: Once | INTRAMUSCULAR | Status: AC
  Administered 2023-08-09: 100 mg via INTRAMUSCULAR
  Filled 2023-08-08: qty 1

## 2023-08-08 MED ORDER — ONDANSETRON HCL 4 MG/2ML IJ SOLN
4.0000 mg | Freq: Once | INTRAMUSCULAR | Status: AC
  Administered 2023-08-08: 4 mg via INTRAVENOUS
  Filled 2023-08-08: qty 2

## 2023-08-08 MED ORDER — MORPHINE SULFATE (PF) 4 MG/ML IV SOLN
4.0000 mg | Freq: Once | INTRAVENOUS | Status: AC
  Administered 2023-08-09: 4 mg via INTRAVENOUS
  Filled 2023-08-08: qty 1

## 2023-08-08 MED ORDER — TRIMETHOBENZAMIDE HCL 100 MG/ML IM SOLN
200.0000 mg | Freq: Once | INTRAMUSCULAR | Status: DC
Start: 2023-08-09 — End: 2023-08-08

## 2023-08-08 NOTE — ED Triage Notes (Signed)
 Patient BIB EMS from home. Patient states she has chest pain that starts at the sternum and radiates up to bilateral shoulders; onset 1645 today. Patient c/o N/V, vomiting x2, ShOB  ASA 324 x3 0.4 Nitroglycerin  Tab patient took prior to EMS arrival 100 mcg of Fent 4mg  Zo

## 2023-08-08 NOTE — ED Notes (Signed)
 ED Provider at bedside.

## 2023-08-08 NOTE — ED Provider Notes (Signed)
 Anna Klein EMERGENCY DEPARTMENT AT [facility] Provider Note  History  Chief Complaint:  Chest Pain and Nausea   Chest Pain Pain location:  Substernal area and epigastric Pain quality: dull and pressure   Pain radiates to:  Does not radiate Pain severity:  Moderate Onset quality:  Gradual Duration:  8 hours Timing:  Constant Progression:  Worsening Chronicity:  New Context: at rest   Context: not breathing   Worsened by:  Movement and exertion Associated symptoms: nausea and vomiting   Associated symptoms: no abdominal pain, no cough, no fever, no lower extremity edema and no shortness of breath   Risk factors: coronary artery disease      Anna Klein is a 78 y.o. female with a history of diabetes, Guillain-Barr, stroke, NSTEMI in March who presents the emergency department for chest pain.  Past Medical History:  Diagnosis Date   DM (diabetes mellitus) (HCC)    GBS (Guillain Barre syndrome) (HCC)    Hypertension    Renal disorder    Stroke [facility])     Past Surgical History:  Procedure Laterality Date   CHOLECYSTECTOMY N/A 03/25/2014   Procedure: LAPAROSCOPIC CHOLECYSTECTOMY WITH INTRAOPERATIVE CHOLANGIOGRAM;  Surgeon: Shela Derby, MD;  Location: [facility] OR;  Service: General;  Laterality: N/A;   LEFT HEART CATH AND CORONARY ANGIOGRAPHY N/A 06/02/2023   Procedure: LEFT HEART CATH AND CORONARY ANGIOGRAPHY;  Surgeon: Knox Perl, MD;  Location: [facility] INVASIVE CV LAB;  Service: Cardiovascular;  Laterality: N/A;    Family History  Problem Relation Age of Onset   Hypertension Mother    Hypertension Father     Social History   Tobacco Use   Smoking status: Never  Substance Use Topics   Alcohol use: No   Drug use: No    Review of Systems  Review of Systems  Constitutional:  Negative for fever.  Respiratory:  Negative for cough and shortness of breath.   Cardiovascular:  Positive for chest pain.  Gastrointestinal:  Positive for nausea and vomiting. Negative  for abdominal pain.     Reviewed and documented in HPI if pertinent.   Physical Exam   ED Triage Vitals  Encounter Vitals Group     BP 08/08/23 2121 (!) 142/79     Systolic BP Percentile --      Diastolic BP Percentile --      Pulse Rate 08/08/23 2121 82     Resp 08/08/23 2121 (!) 30     Temp 08/08/23 2122 97.7 F (36.5 C)     Temp Source 08/08/23 2121 Oral     SpO2 08/08/23 2121 100 %     Weight 08/08/23 2123 160 lb (72.6 kg)     Height 08/08/23 2123 5\' 1"  (1.549 m)     Head Circumference --      Peak Flow --      Pain Score 08/08/23 2122 10     Pain Loc --      Pain Education --      Exclude from Growth Chart --      Physical Exam Vitals and nursing note reviewed.  Constitutional:      General: She is not in acute distress.    Appearance: She is well-developed.  HENT:     Head: Normocephalic and atraumatic.  Eyes:     Conjunctiva/sclera: Conjunctivae normal.  Cardiovascular:     Rate and Rhythm: Normal rate and regular rhythm.     Pulses:  Radial pulses are 2+ on the right side and 2+ on the left side.       Dorsalis pedis pulses are 2+ on the right side and 2+ on the left side.     Heart sounds: No murmur heard. Pulmonary:     Effort: Pulmonary effort is normal. No respiratory distress.     Breath sounds: Normal breath sounds. No wheezing or rhonchi.  Abdominal:     Palpations: Abdomen is soft.     Tenderness: There is no abdominal tenderness.  Musculoskeletal:        General: No swelling.     Cervical back: Neck supple.     Right lower leg: No edema.     Left lower leg: No edema.  Skin:    General: Skin is warm and dry.     Capillary Refill: Capillary refill takes less than 2 seconds.  Neurological:     Mental Status: She is alert.  Psychiatric:        Mood and Affect: Mood normal.      Procedures   Procedures  ED Course - Medical Decision Making  Brief Overview Anna Klein is a 78 y.o. female who presents as per above.  I have  reviewed the nursing documentation for past medical history, family history, and social history and agree.  I have reviewed the patient's vital signs. Hypertension.  Initial Differential Diagnoses: I am primarily concerned for pneumonia, pulmonary medicine, ACS, pneumothorax.  Therapies: These medications and interventions were provided for the patient while in the ED.  Medications  oxyCODONE  (Oxy IR/ROXICODONE ) immediate release tablet 5 mg (5 mg Oral Given 08/08/23 2213)  ondansetron  (ZOFRAN -ODT) disintegrating tablet 4 mg (4 mg Oral Given 08/08/23 2214)  fentaNYL  (SUBLIMAZE ) injection 50 mcg (50 mcg Intravenous Given 08/08/23 2235)  ondansetron  (ZOFRAN ) injection 4 mg (4 mg Intravenous Given 08/08/23 2234)  morphine  (PF) 4 MG/ML injection 4 mg (4 mg Intravenous Given 08/09/23 0026)  trimethobenzamide  (TIGAN ) injection 100 mg (100 mg Intramuscular Given 08/09/23 0000)    Testing Results: On my interpretation labs are significant for : Initial troponin 27, repeat pending Lipase 45 Cr 1.50, baseline D dimer pending  On my interpretation imaging is significant for: CXR without opacity  EKG Interpretation Date/Time:  Friday Aug 08 2023 21:21:54 EDT Ventricular Rate:  87 PR Interval:  153 QRS Duration:  92 QT Interval:  426 QTC Calculation: 513 R Axis:   88  Text Interpretation: Sinus rhythm Anteroseptal infarct, age indeterminate Prolonged QT interval when compared to prior, slightly longer QTc No STEMI Confirmed by Wynell Heath ([id number]) on 08/08/2023 9:29:56 PM   See the EMR for full details regarding lab and imaging results.   Medical Decision Making Pt is a 78 y.o. female with pertinent PMHX of recent NSTEMI who presents w/ chest pain that is similar to prior NSTEMI.  Initial troponin 27.  Patient does not appear to be volume overloaded.  EKG does reveal similar anterior changes.  Her lateral T wave inversions are improved.  Unlikely PNA as CXR negative, no leukocytosis,  no cough, no fever.  Moderate per Lorenz Romano therefore dimer was collected.  Will be followed by oncoming provider.  Doubt Aortic Dissection, Pancreatitis, Arrhythmia, Pneumothorax, Endo/Myo/Pericarditis, Shingles, Emergent complications of an Ulcer, Esophageal pathology, or other emergent pathology.  Given her high risk chest pain story I do feel the patient requires observation and cardiology consult in the morning.  Patient will be admitted by the daytime hospitalist.  I have discussed with the  oncoming team regarding further  Problems Addressed: Precordial pain: acute illness or injury that poses a threat to life or bodily functions  Amount and/or Complexity of Data Reviewed Labs: ordered. Radiology: ordered.  Risk Prescription drug management. Decision regarding hospitalization.    ### All radiography studies, electrocardiograms, and laboratory data were personally reviewed by me and incorporated into my medical decision making. Impression   1. Precordial pain      Note: Dragon medical dictation software was used in the creation of this note.     Arminda Landmark, MD 08/09/23 4098    Tegeler, Marine Sia, MD 08/11/23 [phone]

## 2023-08-09 DIAGNOSIS — Z7982 Long term (current) use of aspirin: Secondary | ICD-10-CM | POA: Diagnosis not present

## 2023-08-09 DIAGNOSIS — K219 Gastro-esophageal reflux disease without esophagitis: Secondary | ICD-10-CM | POA: Diagnosis present

## 2023-08-09 DIAGNOSIS — N1832 Chronic kidney disease, stage 3b: Secondary | ICD-10-CM

## 2023-08-09 DIAGNOSIS — E876 Hypokalemia: Secondary | ICD-10-CM | POA: Diagnosis present

## 2023-08-09 DIAGNOSIS — E785 Hyperlipidemia, unspecified: Secondary | ICD-10-CM

## 2023-08-09 DIAGNOSIS — I10 Essential (primary) hypertension: Secondary | ICD-10-CM | POA: Diagnosis not present

## 2023-08-09 DIAGNOSIS — Z8249 Family history of ischemic heart disease and other diseases of the circulatory system: Secondary | ICD-10-CM | POA: Diagnosis not present

## 2023-08-09 DIAGNOSIS — I214 Non-ST elevation (NSTEMI) myocardial infarction: Secondary | ICD-10-CM | POA: Diagnosis present

## 2023-08-09 DIAGNOSIS — E669 Obesity, unspecified: Secondary | ICD-10-CM | POA: Diagnosis present

## 2023-08-09 DIAGNOSIS — I252 Old myocardial infarction: Secondary | ICD-10-CM | POA: Diagnosis not present

## 2023-08-09 DIAGNOSIS — Z6831 Body mass index (BMI) 31.0-31.9, adult: Secondary | ICD-10-CM | POA: Diagnosis not present

## 2023-08-09 DIAGNOSIS — Z8669 Personal history of other diseases of the nervous system and sense organs: Secondary | ICD-10-CM | POA: Diagnosis not present

## 2023-08-09 DIAGNOSIS — R079 Chest pain, unspecified: Secondary | ICD-10-CM | POA: Diagnosis present

## 2023-08-09 DIAGNOSIS — E1122 Type 2 diabetes mellitus with diabetic chronic kidney disease: Secondary | ICD-10-CM | POA: Diagnosis present

## 2023-08-09 DIAGNOSIS — R072 Precordial pain: Secondary | ICD-10-CM | POA: Diagnosis present

## 2023-08-09 DIAGNOSIS — Z886 Allergy status to analgesic agent status: Secondary | ICD-10-CM | POA: Diagnosis not present

## 2023-08-09 DIAGNOSIS — Z79899 Other long term (current) drug therapy: Secondary | ICD-10-CM | POA: Diagnosis not present

## 2023-08-09 DIAGNOSIS — Z7902 Long term (current) use of antithrombotics/antiplatelets: Secondary | ICD-10-CM | POA: Diagnosis not present

## 2023-08-09 DIAGNOSIS — Z8673 Personal history of transient ischemic attack (TIA), and cerebral infarction without residual deficits: Secondary | ICD-10-CM | POA: Diagnosis not present

## 2023-08-09 DIAGNOSIS — I428 Other cardiomyopathies: Secondary | ICD-10-CM | POA: Diagnosis present

## 2023-08-09 DIAGNOSIS — I130 Hypertensive heart and chronic kidney disease with heart failure and stage 1 through stage 4 chronic kidney disease, or unspecified chronic kidney disease: Secondary | ICD-10-CM | POA: Diagnosis present

## 2023-08-09 DIAGNOSIS — I200 Unstable angina: Secondary | ICD-10-CM | POA: Diagnosis not present

## 2023-08-09 DIAGNOSIS — I25110 Atherosclerotic heart disease of native coronary artery with unstable angina pectoris: Secondary | ICD-10-CM | POA: Diagnosis present

## 2023-08-09 DIAGNOSIS — I255 Ischemic cardiomyopathy: Secondary | ICD-10-CM | POA: Diagnosis present

## 2023-08-09 DIAGNOSIS — I5022 Chronic systolic (congestive) heart failure: Secondary | ICD-10-CM | POA: Diagnosis present

## 2023-08-09 DIAGNOSIS — I447 Left bundle-branch block, unspecified: Secondary | ICD-10-CM | POA: Diagnosis present

## 2023-08-09 DIAGNOSIS — Z7984 Long term (current) use of oral hypoglycemic drugs: Secondary | ICD-10-CM | POA: Diagnosis not present

## 2023-08-09 LAB — TROPONIN I (HIGH SENSITIVITY)
Troponin I (High Sensitivity): 49 ng/L — ABNORMAL HIGH (ref <18)
Troponin I (High Sensitivity): 80 ng/L — ABNORMAL HIGH (ref <18)
Troponin I (High Sensitivity): 89 ng/L — ABNORMAL HIGH (ref <18)

## 2023-08-09 LAB — HEPARIN LEVEL (UNFRACTIONATED)
Heparin Unfractionated: 0.36 [IU]/mL (ref 0.30–0.70)
Heparin Unfractionated: 0.3 [IU]/mL (ref 0.30–0.70)

## 2023-08-09 LAB — BRAIN NATRIURETIC PEPTIDE: B Natriuretic Peptide: 616.1 pg/mL — ABNORMAL HIGH (ref 0.0–100.0)

## 2023-08-09 LAB — GLUCOSE, CAPILLARY
Glucose-Capillary: 109 mg/dL — ABNORMAL HIGH (ref 70–99)
Glucose-Capillary: 113 mg/dL — ABNORMAL HIGH (ref 70–99)

## 2023-08-09 LAB — CBG MONITORING, ED: Glucose-Capillary: 127 mg/dL — ABNORMAL HIGH (ref 70–99)

## 2023-08-09 LAB — D-DIMER, QUANTITATIVE: D-Dimer, Quant: 0.44 ug{FEU}/mL (ref 0.00–0.50)

## 2023-08-09 MED ORDER — MORPHINE SULFATE (PF) 4 MG/ML IV SOLN
4.0000 mg | Freq: Once | INTRAVENOUS | Status: AC
  Administered 2023-08-09: 4 mg via INTRAVENOUS
  Filled 2023-08-09: qty 1

## 2023-08-09 MED ORDER — ATORVASTATIN CALCIUM 40 MG PO TABS
40.0000 mg | ORAL_TABLET | Freq: Every day | ORAL | Status: DC
  Administered 2023-08-09 – 2023-08-13 (×5): 40 mg via ORAL
  Filled 2023-08-09 (×5): qty 1

## 2023-08-09 MED ORDER — ASPIRIN 81 MG PO TBEC
81.0000 mg | DELAYED_RELEASE_TABLET | Freq: Every day | ORAL | Status: DC
  Administered 2023-08-09 – 2023-08-13 (×4): 81 mg via ORAL
  Filled 2023-08-09 (×4): qty 1

## 2023-08-09 MED ORDER — INSULIN ASPART 100 UNIT/ML IJ SOLN: 0.0000 [IU] | Freq: Every day | INTRAMUSCULAR | Status: DC

## 2023-08-09 MED ORDER — HEPARIN (PORCINE) 25000 UT/250ML-% IV SOLN
900.0000 [IU]/h | INTRAVENOUS | Status: DC
  Administered 2023-08-09 – 2023-08-11 (×3): 900 [IU]/h via INTRAVENOUS
  Filled 2023-08-09 (×3): qty 250

## 2023-08-09 MED ORDER — ALUM & MAG HYDROXIDE-SIMETH 200-200-20 MG/5ML PO SUSP
30.0000 mL | ORAL | Status: DC | PRN
  Filled 2023-08-09: qty 30

## 2023-08-09 MED ORDER — ACETAMINOPHEN 325 MG PO TABS
650.0000 mg | ORAL_TABLET | Freq: Four times a day (QID) | ORAL | Status: DC | PRN
  Administered 2023-08-09 – 2023-08-13 (×5): 650 mg via ORAL
  Filled 2023-08-09 (×5): qty 2

## 2023-08-09 MED ORDER — ACETAMINOPHEN 650 MG RE SUPP: 650.0000 mg | Freq: Four times a day (QID) | RECTAL | Status: DC | PRN

## 2023-08-09 MED ORDER — EMPAGLIFLOZIN 10 MG PO TABS
10.0000 mg | ORAL_TABLET | Freq: Every day | ORAL | Status: DC
  Administered 2023-08-09 – 2023-08-13 (×5): 10 mg via ORAL
  Filled 2023-08-09 (×5): qty 1

## 2023-08-09 MED ORDER — HEPARIN BOLUS VIA INFUSION
2000.0000 [IU] | Freq: Once | INTRAVENOUS | Status: AC
  Administered 2023-08-09: 2000 [IU] via INTRAVENOUS
  Filled 2023-08-09: qty 2000

## 2023-08-09 MED ORDER — ONDANSETRON HCL 4 MG PO TABS: 4.0000 mg | ORAL_TABLET | Freq: Four times a day (QID) | ORAL | Status: DC | PRN

## 2023-08-09 MED ORDER — NITROGLYCERIN IN D5W 200-5 MCG/ML-% IV SOLN
0.0000 ug/min | INTRAVENOUS | Status: DC
  Administered 2023-08-09: 5 ug/min via INTRAVENOUS
  Filled 2023-08-09: qty 250

## 2023-08-09 MED ORDER — GABAPENTIN 300 MG PO CAPS
300.0000 mg | ORAL_CAPSULE | Freq: Every day | ORAL | Status: DC
  Administered 2023-08-10 – 2023-08-12 (×3): 300 mg via ORAL
  Filled 2023-08-09 (×4): qty 1

## 2023-08-09 MED ORDER — INSULIN ASPART 100 UNIT/ML IJ SOLN
0.0000 [IU] | Freq: Three times a day (TID) | INTRAMUSCULAR | Status: DC
  Administered 2023-08-09 – 2023-08-11 (×2): 2 [IU] via SUBCUTANEOUS

## 2023-08-09 MED ORDER — ONDANSETRON HCL 4 MG/2ML IJ SOLN: 4.0000 mg | Freq: Four times a day (QID) | INTRAMUSCULAR | Status: DC | PRN

## 2023-08-09 MED ORDER — METOPROLOL SUCCINATE ER 50 MG PO TB24
50.0000 mg | ORAL_TABLET | Freq: Every day | ORAL | Status: DC
  Administered 2023-08-10 – 2023-08-13 (×4): 50 mg via ORAL
  Filled 2023-08-09 (×5): qty 1

## 2023-08-09 MED ORDER — NITROGLYCERIN 0.4 MG SL SUBL
0.4000 mg | SUBLINGUAL_TABLET | SUBLINGUAL | Status: DC | PRN
  Administered 2023-08-09 (×2): 0.4 mg via SUBLINGUAL
  Filled 2023-08-09 (×2): qty 1

## 2023-08-09 MED ORDER — CLOPIDOGREL BISULFATE 75 MG PO TABS
75.0000 mg | ORAL_TABLET | Freq: Every day | ORAL | Status: DC
  Administered 2023-08-10 – 2023-08-13 (×4): 75 mg via ORAL
  Filled 2023-08-09 (×4): qty 1

## 2023-08-09 MED ORDER — MELATONIN 3 MG PO TABS
3.0000 mg | ORAL_TABLET | Freq: Every day | ORAL | Status: DC
  Administered 2023-08-11: 3 mg via ORAL
  Filled 2023-08-09 (×3): qty 1

## 2023-08-09 MED ORDER — PANTOPRAZOLE SODIUM 20 MG PO TBEC
20.0000 mg | DELAYED_RELEASE_TABLET | Freq: Every day | ORAL | Status: DC
  Administered 2023-08-09 – 2023-08-13 (×5): 20 mg via ORAL
  Filled 2023-08-09 (×5): qty 1

## 2023-08-09 NOTE — Progress Notes (Signed)
 ANTICOAGULATION CONSULT NOTE  Pharmacy Consult for Heparin  Indication: chest pain/ACS  Allergies  Allergen Reactions   Aspirin  Other (See Comments)    Due to kidney function; Patient is taking medication and is not allergic or sensitive to.    Nsaids Other (See Comments)    Cannot take due to kidney function.    Patient Measurements: Height: 5' (152.4 cm) Weight: 72.6 kg (160 lb 1.6 oz) IBW/kg (Calculated) : 45.5 Heparin  Dosing Weight: 63.6 kg  Vital Signs: Temp: 97.6 F (36.4 C) (05/24 1613) Temp Source: Oral (05/24 1613) BP: 116/59 (05/24 1711) Pulse Rate: 68 (05/24 1410)  Labs: Recent Labs    08/08/23 2144 08/08/23 2353 08/09/23 0210 08/09/23 0340 08/09/23 1124 08/09/23 1904  HGB 14.5  --   --   --   --   --   HCT 45.1  --   --   --   --   --   PLT 164  --   --   --   --   --   HEPARINUNFRC  --   --   --   --  0.36 0.30  CREATININE 1.50*  --   --   --   --   --   TROPONINIHS 27* 49* 80* 89*  --   --     Estimated Creatinine Clearance: 27 mL/min (A) (by C-G formula based on SCr of 1.5 mg/dL (H)).   Medical History: Past Medical History:  Diagnosis Date   DM (diabetes mellitus) (HCC)    GBS (Guillain Barre syndrome) (HCC)    Hypertension    Renal disorder    Stroke (HCC)     Medications:  Medications Prior to Admission  Medication Sig Dispense Refill Last Dose/Taking   acetaminophen  (TYLENOL ) 325 MG tablet Take 650 mg by mouth every 6 (six) hours as needed for mild pain (pain score 1-3).   Past Week   ascorbic acid (VITAMIN C) 500 MG tablet Take 500 mg by mouth daily.   08/08/2023 Morning   aspirin  EC 81 MG tablet Take 1 tablet (81 mg total) by mouth daily. Swallow whole. (Patient taking differently: Take 81 mg by mouth at bedtime. Swallow whole.) 30 tablet 0 08/08/2023 Bedtime   atorvastatin  (LIPITOR ) 40 MG tablet Take 1 tablet (40 mg total) by mouth daily. (Patient taking differently: Take 40 mg by mouth at bedtime.) 30 tablet 0 08/08/2023 Bedtime    brimonidine (ALPHAGAN) 0.2 % ophthalmic solution Place 1 drop into both eyes 3 (three) times daily.   08/08/2023 Noon   clopidogrel  (PLAVIX ) 75 MG tablet Take 1 tablet (75 mg total) by mouth daily with breakfast. 30 tablet 0 08/08/2023 Morning   cyanocobalamin  (,VITAMIN B-12,) 1000 MCG/ML injection Inject 1 mL (1,000 mcg total) into the muscle once a week. 1 mL 0 08/08/2023 Morning   dorzolamide -timolol  (COSOPT ) 2-0.5 % ophthalmic solution Place 1 drop into both eyes 2 (two) times daily.   08/08/2023 Morning   empagliflozin  (JARDIANCE ) 10 MG TABS tablet Take 1 tablet (10 mg total) by mouth daily. 30 tablet 0 08/08/2023 Morning   gabapentin  (NEURONTIN ) 300 MG capsule Take 300 mg by mouth at bedtime.   Past Week   Magnesium  250 MG TABS Take 500 mg by mouth at bedtime.   Past Week   melatonin 3 MG TABS tablet Take 3 mg by mouth at bedtime.   Past Week   metoprolol  succinate (TOPROL  XL) 50 MG 24 hr tablet Take 1 tablet (50 mg total) by mouth daily. Take  with or immediately following a meal. 30 tablet 0 08/08/2023 Morning   Multiple Vitamins-Minerals (PRESERVISION AREDS) CAPS Take 1 capsule by mouth in the morning and at bedtime.   08/08/2023 Morning   nitroGLYCERIN  (NITROSTAT ) 0.4 MG SL tablet Place 1 tablet (0.4 mg total) under the tongue every 5 (five) minutes as needed for chest pain. 25 tablet 0 08/08/2023 Evening   pantoprazole  (PROTONIX ) 20 MG tablet Take 1 tablet (20 mg total) by mouth daily. 30 tablet 4 08/08/2023 Morning   potassium chloride  SA (KLOR-CON  M) 20 MEQ tablet Take 20 mEq by mouth daily.   08/08/2023 Morning   ROCKLATAN 0.02-0.005 % SOLN Place 1 drop into both eyes at bedtime.   Past Week   torsemide  (DEMADEX ) 10 MG tablet Take 10 mg by mouth daily. Can take an additional tablet as needed for weight gain   08/08/2023 Morning   Vitamin A 3 MG (10000 UT) TABS Take 1 tablet by mouth daily.   08/08/2023 Morning   [Paused] hydrALAZINE  (APRESOLINE ) 25 MG tablet Take 1 tablet (25 mg total) by mouth  every 8 (eight) hours. (Patient not taking: Reported on 08/09/2023) 60 tablet 0 Unknown   sacubitril -valsartan  (ENTRESTO ) 49-51 MG Take 1 tablet by mouth 2 (two) times daily. (Patient not taking: Reported on 08/09/2023) 60 tablet 0 Not Taking   Scheduled:   aspirin  EC  81 mg Oral Daily   atorvastatin   40 mg Oral Daily   [START ON 08/10/2023] clopidogrel   75 mg Oral Q breakfast   empagliflozin   10 mg Oral Daily   gabapentin   300 mg Oral QHS   insulin  aspart  0-15 Units Subcutaneous TID WC   insulin  aspart  0-5 Units Subcutaneous QHS   melatonin  3 mg Oral QHS   metoprolol  succinate  50 mg Oral Daily   pantoprazole   20 mg Oral Daily   Infusions:   heparin  900 Units/hr (08/09/23 1341)   nitroGLYCERIN  Stopped (08/09/23 1643)   PRN: acetaminophen  **OR** acetaminophen , ondansetron  **OR** ondansetron  (ZOFRAN ) IV  Assessment: 78yo female c/o CP radiating to shoulders and associated w/ N/V/SOB. Heparin  per pharmacy consult placed for chest pain/ACS. LHC planned for 08/12/23.  Patient was not on anticoagulation prior to arrival. Started on 900 units/hr IV heparin  following 2000 unit IV heparin  bolus. Resulting heparin  level is 0.30 which is borderline low/therapeutic.  No issues with infusion or bleeding per RN.  Hgb 14.5; plt 164  Goal of Therapy:  Heparin  level 0.3-0.7 units/ml Monitor platelets by anticoagulation protocol: Yes   Plan:  Increase heparin  infusion to 950 units/hr Follow up morning heparin  level Daily heparin  level while on heparin  Continue to monitor H&H and platelets  Estela Held, PharmD PGY-2 Infectious Diseases Pharmacy Resident [facility] 08/09/2023 8:05 PM

## 2023-08-09 NOTE — Progress Notes (Signed)
 LHC planned on Tuesday 08/12/23, orders placed, per Dr Audery Blazing request. Will need to re-assess renal function on Tuesday with BMP.

## 2023-08-09 NOTE — ED Provider Notes (Signed)
 Trops uptrending gradually.  Still have pain.  I spoke with cardiology, Dr. Donna Fus, who recommends starting heparin .  PRN nitroglycerin .  Cards still to consult in AM.  Awaiting hospitalist admission.   Sherel Dikes, PA-C 08/09/23 4098    Maralee Senate, April, MD 08/09/23 1191

## 2023-08-09 NOTE — Progress Notes (Signed)
 BP below parameter at rest. Pt denies chest pain since arrival to unit. Notified NP Caretha Chapel, ok to pause Nitroglycerin  drip for now and resume if patient c/o chest pain again.

## 2023-08-09 NOTE — ED Notes (Signed)
 6 E called aware patient is coming

## 2023-08-09 NOTE — Plan of Care (Signed)
  Problem: Skin Integrity: Goal: Risk for impaired skin integrity will decrease Outcome: Progressing   Problem: Nutritional: Goal: Maintenance of adequate nutrition will improve Outcome: Progressing   Problem: Elimination: Goal: Will not experience complications related to bowel motility Outcome: Progressing

## 2023-08-09 NOTE — Progress Notes (Signed)
 PHARMACY - ANTICOAGULATION CONSULT NOTE  Pharmacy Consult for heparin  Indication: chest pain/ACS  Allergies  Allergen Reactions   Aspirin  Other (See Comments)    Due to kidney function; Patient is taking medication and is not allergic or sensitive to.    Nsaids Other (See Comments)    Cannot take due to kidney function.    Patient Measurements: Height: 5\' 1"  (154.9 cm) Weight: 72.6 kg (160 lb) IBW/kg (Calculated) : 47.8 HEPARIN  DW (KG): 63.6  Vital Signs: Temp: 98 F (36.7 C) (05/24 0051) Temp Source: Axillary (05/24 0051) BP: 166/98 (05/24 0300) Pulse Rate: 95 (05/24 0309)  Labs: Recent Labs    08/08/23 2144 08/08/23 2353 08/09/23 0210  HGB 14.5  --   --   HCT 45.1  --   --   PLT 164  --   --   CREATININE 1.50*  --   --   TROPONINIHS 27* 49* 80*    Estimated Creatinine Clearance: 27.7 mL/min (A) (by C-G formula based on SCr of 1.5 mg/dL (H)).   Medical History: Past Medical History:  Diagnosis Date   DM (diabetes mellitus) (HCC)    GBS (Guillain Barre syndrome) (HCC)    Hypertension    Renal disorder    Stroke [facility])     Assessment: 78yo female c/o CP radiating to shoulders and associated w/ N/V/SOB, troponins elevated and rising >>  to begin heparin .  Goal of Therapy:  Heparin  level 0.3-0.7 units/ml Monitor platelets by anticoagulation protocol: Yes   Plan:  Heparin  2000 units IV bolus followed by infusion at 900 units/hr. Monitor heparin  levels and CBC.  Lonnie Roberts, PharmD, BCPS  08/09/2023,3:25 AM

## 2023-08-09 NOTE — Progress Notes (Signed)
 ANTICOAGULATION CONSULT NOTE  Pharmacy Consult for Heparin  Indication: chest pain/ACS  Allergies  Allergen Reactions   Aspirin  Other (See Comments)    Due to kidney function; Patient is taking medication and is not allergic or sensitive to.    Nsaids Other (See Comments)    Cannot take due to kidney function.    Patient Measurements: Height: 5\' 1"  (154.9 cm) Weight: 72.6 kg (160 lb) IBW/kg (Calculated) : 47.8 Heparin  Dosing Weight: 63.6 kg  Vital Signs: Temp: 98.4 F (36.9 C) (05/24 0934) Temp Source: Oral (05/24 0934) BP: 107/65 (05/24 1055) Pulse Rate: 72 (05/24 1055)  Labs: Recent Labs    08/08/23 2144 08/08/23 2353 08/09/23 0210 08/09/23 0340  HGB 14.5  --   --   --   HCT 45.1  --   --   --   PLT 164  --   --   --   CREATININE 1.50*  --   --   --   TROPONINIHS 27* 49* 80* 89*    Estimated Creatinine Clearance: 27.7 mL/min (A) (by C-G formula based on SCr of 1.5 mg/dL (H)).   Medical History: Past Medical History:  Diagnosis Date   DM (diabetes mellitus) (HCC)    GBS (Guillain Barre syndrome) (HCC)    Hypertension    Renal disorder    Stroke (HCC)     Medications:  (Not in a hospital admission)  Scheduled:   aspirin  EC  81 mg Oral Daily   atorvastatin   40 mg Oral Daily   [START ON 08/10/2023] clopidogrel   75 mg Oral Q breakfast   empagliflozin   10 mg Oral Daily   gabapentin   300 mg Oral QHS   insulin  aspart  0-15 Units Subcutaneous TID WC   insulin  aspart  0-5 Units Subcutaneous QHS   melatonin  3 mg Oral QHS   metoprolol  succinate  50 mg Oral Daily   pantoprazole   20 mg Oral Daily   Infusions:   heparin  900 Units/hr (08/09/23 0353)   nitroGLYCERIN  5 mcg/min (08/09/23 1041)   PRN: acetaminophen  **OR** acetaminophen , ondansetron  **OR** ondansetron  (ZOFRAN ) IV  Assessment: 78yo female c/o CP radiating to shoulders and associated w/ N/V/SOB. Heparin  per pharmacy consult placed for chest pain/ACS. LHC planned for 08/12/23.  Patient was not on  anticoagulation prior to arrival. Started on 900 units/hr IV heparin  following 2000 unit IV heparin  bolus. Resulting heparin  level is 0.36 which is borderline low/therapeutic.  No issues with infusion or bleeding per RN.  Hgb 14.5; plt 164  Goal of Therapy:  Heparin  level 0.3-0.7 units/ml Monitor platelets by anticoagulation protocol: Yes   Plan:  Continue heparin  infusion at 900 units/hr Check anti-Xa level at 1800 Daily heparin  level while on heparin  Continue to monitor H&H and platelets  Dionicio Fray, PharmD, BCPS 08/09/2023 11:05 AM ED Clinical Pharmacist -  [phone]

## 2023-08-09 NOTE — Consult Note (Addendum)
 As above, patient seen and examined.  Briefly she is a 78 year old female with past medical history of hypertension, hyperlipidemia, chronic stage IIIb kidney disease, gastroesophageal reflux disease, recent admission with non-ST elevation microinfarction for evaluation of chest pain/recurrent non-ST elevation myocardial infarction.  Patient was admitted March 2025 with non-ST elevation myocardial infarction with peak troponin 2210.  Echocardiogram showed ejection fraction 45 to 50% and there appears to be hypokinesis of the anteroseptal wall and apex.  Cardiac catheterization March 2025 showed ejection fraction 45% with anterior and apical hypokinesis, moderate stenosis in the proximal LAD and 20% right coronary artery.  This was felt to be an unstable/ulcerated plaque in the proximal LAD but there was TIMI III flow and medical therapy recommended.  Patient has done well until yesterday afternoon at 445 when she developed recurrent chest pain.  It is described as a pressure radiating to her shoulders and neck.  There was associated nausea, vomiting, diaphoresis and dyspnea.  The pain was not pleuritic.  It was persistent but began to improve this morning and is essentially resolved at present.  Cardiology now asked to evaluate.  Creatinine is 1.50, liver functions are normal.  Troponin is 27, 49 and 89.  Hemoglobin 14.5.  Electrocardiogram shows sinus rhythm with diffuse mild ST depression.  1 non-ST elevation myocardial infarction-patient has had recurrent chest pain similar to her symptoms with previous event.  She is presently pain-free.  Will treat with aspirin , continue Plavix , continue Toprol /statin and add heparin  and nitroglycerin .  Repeat echocardiogram.  I have reviewed the case with Dr. Addie Holstein and there was a hazy lesion in the proximal LAD on previous catheterization that was treated medically.  Given recurrent symptoms we will plan repeat cardiac catheterization.  The risk and benefits including  myocardial infarction, CVA and death discussed and she agrees to proceed.  Plan to proceed on Tuesday or sooner if she has worsening symptoms.  2 hyperlipidemia-continue statin.  3 hypertension-patient's blood pressure is controlled.  Continue present medications.  4 chronic stage IIIb kidney disease-hydrate prior to procedure and no ventriculogram.  Follow renal function after procedure.  Alexandria Angel, MD      Cardiology Consultation   Patient ID: Saniyah Mondesir MRN: [id number]; DOB: 11-Sep-1945  Admit date: 08/08/2023 Date of Consult: 08/09/2023  PCP:  Bentley Bray, PA   [facility] Providers Cardiologist:  Dr Lorie Rook   Patient Profile:   Ellysia Char is a 78 y.o. female with a hx of non-obstructive CAD, NICM, HFmrEF, HLD, LBBB, Guillain-Barr syndrome, type 2 DM, HTN, CKD IIIb, GERD/IBS,  who is being seen 08/09/2023 for the evaluation of elevated troponin at the request of Dr. Maralee Senate.   History of Present Illness:   Ms. Batterman with above past medical history presented to the ER last night complaining chest pain. She states she was sitting on the couch around 445pm yesterday when she started having mid-sternum/epigastric pain. Pain was sharp in quality, pressure like, radiating to her left shoulder/neck/back area. She was diaphoretic, had nausea and multiple emesis with bile color fluid. She states pain was persistent, 10/10 at worst. She took 3 Nitroglycerin  at home which helped only a few minutes. Her daughter called EMS. She felt her pain started to improve after receiving morphine  at ER around 4am this morning. She states she has 2-3/10 pain at this time. She states current episode is quite similar to what she had in March this year, when she was hospitalized for MI. She states she was a never smoker.  She has been complaint with her medication including ASA, Plavix , lipitor , toprolol XL, and jardiance . She was suppose to take Entresto  from March 2025 hospitalization, but  due to rising Cr, she never started it. She has been taking torsemide  10mg  for several years due to CHF and leg edema. Torsemide  was increased to 20mg  daily on 06/17/23 cardiology follow up, she was not sure why this was increased and denied having any increased CHF symptoms such as SOB, orthopnea, PND, leg edema, or weight gain.   Per chart review, she was recently admitted here on 05/31/2023 for non-STEMI.  She had intermittent chest pain for a week that escalated.  Hs troponin was 40 and peaked at 2210.  Echo on 06/01/2023 revealing LVEF 45 to 50%, global hypokinesis, indeterminate diastolic parameter, normal RV, aortic sclerosis.  LHC on 06/02/2023 revealing mild diffuse proximal LAD calcification and moderate focal calcification at the origin of D1, minimal disease of left circumflex, 20% stenosis of proximal RCA.  It was suspected she had unstable and ulcerated plaque in the proximal LAD at the calcific stenosis however this was completely healed with TIMI-3 flow with no evidence of any contrast hang up.  LVEDP was 11 mmHg.  She was discharged on medical therapy with DAPT including aspirin  81 mg and Plavix  75 mg for 10-month as well as 80 mg Lipitor .  She was also recommended on GDMT for NICM with Toprol  XL 50 mg daily, Jardiance  10 mg daily, Entresto  49/51 mg twice daily.   She has followed up in the office with Mr Valentina Gasman NP-C on 06/17/23, it was discovered that her Entresto  was not started due to rising creatinine and she was waiting to see her PCP. She reports her PCP opted not to start Entresto  ultimately.  She was euvolemic on exam without angina, compliant with DAPT, Jardiance  and Toprol  XL and Lipitor . Torsemide  was increased from 10 mg to 20mg  with potassium supplement during that visit.   Per ER workup yesterday, CMP revealed elevated glucose 188, creatinine 1.5, EGFR 35, anion gap 17, total bilirubin 1.6.  CBC grossly unremarkable.  High sensitive troponin 27 >49>80 >89.  D-dimer -0.44.  Chest x-ray  revealing no acute finding.  EKG revealing sinus rhythm with old anteroseptal Q wave.  She was given 8 mg of morphine , 8 mg of Zofran , 100mg  of trimethobenzamide,  50 mcg fentanyl  and 5 mg oxycodone  at ED. She was started on heparin  infusion.  Cardiology is consulted today for further evaluation.     Past Medical History:  Diagnosis Date   DM (diabetes mellitus) (HCC)    GBS (Guillain Barre syndrome) (HCC)    Hypertension    Renal disorder    Stroke [facility])     Past Surgical History:  Procedure Laterality Date   CHOLECYSTECTOMY N/A 03/25/2014   Procedure: LAPAROSCOPIC CHOLECYSTECTOMY WITH INTRAOPERATIVE CHOLANGIOGRAM;  Surgeon: Shela Derby, MD;  Location: [facility] OR;  Service: General;  Laterality: N/A;   LEFT HEART CATH AND CORONARY ANGIOGRAPHY N/A 06/02/2023   Procedure: LEFT HEART CATH AND CORONARY ANGIOGRAPHY;  Surgeon: Knox Perl, MD;  Location: [facility] INVASIVE CV LAB;  Service: Cardiovascular;  Laterality: N/A;     Home Medications:  Prior to Admission medications   Medication Sig Start Date End Date Taking? Authorizing Provider  acetaminophen  (TYLENOL ) 325 MG tablet Take 650 mg by mouth every 6 (six) hours as needed for mild pain (pain score 1-3).   Yes [provider]  ascorbic acid (VITAMIN C) 500 MG tablet Take 500 mg by  mouth daily.   Yes [provider]  aspirin  EC 81 MG tablet Take 1 tablet (81 mg total) by mouth daily. Swallow whole. Patient taking differently: Take 81 mg by mouth at bedtime. Swallow whole. 06/04/23  Yes Ronni Colace, DO  atorvastatin  (LIPITOR ) 40 MG tablet Take 1 tablet (40 mg total) by mouth daily. Patient taking differently: Take 40 mg by mouth at bedtime. 06/04/23  Yes Ronni Colace, DO  brimonidine (ALPHAGAN) 0.2 % ophthalmic solution Place 1 drop into both eyes 3 (three) times daily. 01/05/19  Yes [provider]  clopidogrel  (PLAVIX ) 75 MG tablet Take 1 tablet (75 mg total) by mouth daily with breakfast. 06/03/23  Yes Ronni Colace,  DO  cyanocobalamin  (,VITAMIN B-12,) 1000 MCG/ML injection Inject 1 mL (1,000 mcg total) into the muscle once a week. 04/11/14  Yes Gherghe, Costin M, MD  dorzolamide -timolol  (COSOPT ) 2-0.5 % ophthalmic solution Place 1 drop into both eyes 2 (two) times daily.   Yes [provider]  empagliflozin  (JARDIANCE ) 10 MG TABS tablet Take 1 tablet (10 mg total) by mouth daily. 06/03/23  Yes Ronni Colace, DO  gabapentin  (NEURONTIN ) 300 MG capsule Take 300 mg by mouth at bedtime. 04/27/15  Yes [provider]  Magnesium  250 MG TABS Take 500 mg by mouth at bedtime.   Yes [provider]  melatonin 3 MG TABS tablet Take 3 mg by mouth at bedtime. 01/15/17  Yes [provider]  metoprolol  succinate (TOPROL  XL) 50 MG 24 hr tablet Take 1 tablet (50 mg total) by mouth daily. Take with or immediately following a meal. 06/03/23  Yes Ronni Colace, DO  Multiple Vitamins-Minerals (PRESERVISION AREDS) CAPS Take 1 capsule by mouth in the morning and at bedtime. 07/10/17  Yes [provider]  nitroGLYCERIN  (NITROSTAT ) 0.4 MG SL tablet Place 1 tablet (0.4 mg total) under the tongue every 5 (five) minutes as needed for chest pain. 06/03/23  Yes Ronni Colace, DO  pantoprazole  (PROTONIX ) 20 MG tablet Take 1 tablet (20 mg total) by mouth daily. 06/17/23  Yes Gerald Kitty., NP  potassium chloride  SA (KLOR-CON  M) 20 MEQ tablet Take 20 mEq by mouth daily.   Yes [provider]  ROCKLATAN 0.02-0.005 % SOLN Place 1 drop into both eyes at bedtime. 10/25/21  Yes [provider]  torsemide  (DEMADEX ) 10 MG tablet Take 10 mg by mouth daily. Can take an additional tablet as needed for weight gain   Yes [provider]  Vitamin A 3 MG (10000 UT) TABS Take 1 tablet by mouth daily.   Yes [provider]  hydrALAZINE  (APRESOLINE ) 25 MG tablet Take 1 tablet (25 mg total) by mouth every 8 (eight) hours. Patient not taking: Reported on 08/09/2023 03/30/14   Dhungel,  Delman Ferns, MD  sacubitril -valsartan  (ENTRESTO ) 49-51 MG Take 1 tablet by mouth 2 (two) times daily. Patient not taking: Reported on 08/09/2023 06/03/23   Ronni Colace, DO    Inpatient Medications: Scheduled Meds:  Continuous Infusions:  heparin  900 Units/hr (08/09/23 0353)   PRN Meds: nitroGLYCERIN   Allergies:    Allergies  Allergen Reactions   Aspirin  Other (See Comments)    Due to kidney function; Patient is taking medication and is not allergic or sensitive to.    Nsaids Other (See Comments)    Cannot take due to kidney function.    Social History:   Social History   Socioeconomic History   Marital status: Widowed    Spouse name: Not on file  Number of children: Not on file   Years of education: Not on file   Highest education level: Not on file  Occupational History   Not on file  Tobacco Use   Smoking status: Never   Smokeless tobacco: Not on file  Substance and Sexual Activity   Alcohol use: No   Drug use: No   Sexual activity: Yes    Birth control/protection: Post-menopausal  Other Topics Concern   Not on file  Social History Narrative   Not on file   Social Drivers of Health   Financial Resource Strain: Not on file  Food Insecurity: Low Risk  (06/23/2023)   Received from [facility]   Hunger Vital Sign    Worried About Running Out of Food in the Last Year: Never true    Ran Out of Food in the Last Year: Never true  Transportation Needs: No Transportation Needs (06/23/2023)   Received from [employer]    In the past 12 months, has lack of reliable transportation kept you from medical appointments, meetings, work or from getting things needed for daily living? : No  Physical Activity: Not on file  Stress: Not on file  Social Connections: Socially Isolated (06/01/2023)   Social Connection and Isolation Panel [NHANES]    Frequency of Communication with Friends and Family: More than three times a week    Frequency of Social Gatherings  with Friends and Family: More than three times a week    Attends Religious Services: Never    Database administrator or Organizations: No    Attends Banker Meetings: Never    Marital Status: Widowed  Intimate Partner Violence: Not At Risk (06/01/2023)   Humiliation, Afraid, Rape, and Kick questionnaire    Fear of Current or Ex-Partner: No    Emotionally Abused: No    Physically Abused: No    Sexually Abused: No    Family History:    Family History  Problem Relation Age of Onset   Hypertension Mother    Hypertension Father      ROS:  Constitutional: Denied fever, chills, malaise, night sweats Eyes: Denied vision change or loss Ears/Nose/Mouth/Throat: Denied ear ache, sore throat, coughing, sinus pain Cardiovascular: see HPI  Respiratory: see HPI  Gastrointestinal: epigastric pain  Genital/Urinary: Denied dysuria, hematuria, urinary frequency/urgency Musculoskeletal: Denied muscle ache, joint pain, weakness Skin: Denied rash, wound Neuro: Denied headache, dizziness, syncope Psych: Denied history of depression/anxiety  Endocrine: history of diabetes   Physical Exam/Data:   Vitals:   08/09/23 0630 08/09/23 0700 08/09/23 0730 08/09/23 0800  BP: (!) 144/81 120/67 134/77 113/71  Pulse: 91 85 79 81  Resp: 13 (!) 22 20 18   Temp:      TempSrc:      SpO2: 100% 93% 100% 91%  Weight:      Height:        Intake/Output Summary (Last 24 hours) at 08/09/2023 0915 Last data filed at 08/09/2023 8119 Gross per 24 hour  Intake --  Output 300 ml  Net -300 ml      08/08/2023    9:23 PM 06/17/2023    8:46 AM 06/01/2023   12:52 AM  Last 3 Weights  Weight (lbs) 160 lb 162 lb 3.2 oz 160 lb 15 oz  Weight (kg) 72.576 kg 73.573 kg 73 kg     Body mass index is 30.23 kg/m.   Vitals:  Vitals:   08/09/23 0730 08/09/23 0800  BP: 134/77 113/71  Pulse: 79 81  Resp: 20 18  Temp:    SpO2: 100% 91%   General Appearance: In no apparent distress, laying in bed HEENT:  Normocephalic, atraumatic.  Neck: Supple, trachea midline, no JVDs Cardiovascular: Regular rate and rhythm, normal S1-S2,  no murmur  Respiratory: Resting breathing unlabored, lungs sounds clear to auscultation bilaterally, no use of accessory muscles. On room air.   Gastrointestinal: Bowel sounds positive, abdomen soft, non-tender, non-distended. No mass or organomegaly.  Extremities: Able to move all extremities in bed without difficulty, no edema of BLE Musculoskeletal: Normal muscle bulk and tone Skin: Intact, warm, dry. No rashes or petechiae noted in exposed areas.  Neurologic: Alert, oriented to person, place and time. Fluent speech, no cognitive deficit, no gross focal neuro deficit Psychiatric: Normal affect. Mood is appropriate.     EKG:  The EKG was personally reviewed and demonstrates:    EKG from 08/08/23 showed sinus rhythm 87bpm, old anteroseptal Q, manual QT 482 msec  EKG from 08/09/23 at 1:48 showed sinus rhythm 94bpm, old anteroseptal Q  Telemetry:  Telemetry was personally reviewed and demonstrates:    Sinus rhythm with PVCs    Relevant CV Studies:   LHC on 06/02/23:  Left Heart Catheterization 06/02/23: Hemodynamic data: LVEDP 11 mmHg.  No pressure gradient across the aortic valve.   Angiographic data: LV: Apical anterior and apical hypokinesis.  No significant mitral regurgitation.  LVEF 45%. LM: Large-caliber vessel, smooth and normal. LAD: Large vessel, has mild diffuse proximal coronary calcification and moderate focal calcification at the origin of D1 and appears hazy but with TIMI-3 flow.  Rest of the LAD had no significant disease. LCx: Large vessel, gives origin to tiny OM1 and a large OM 2 proximal to mid CX has minimal disease. RCA: Large-caliber vessel with a dominant vessel, proximal segment is a 20% lesion.      Impression and recommendations: Suspect unstable and ulcerated plaque in the proximal LAD at the calcific stenosis however this is  completely healed with TIMI-3 flow with no evidence of any contrast hang up, would recommend medical therapy with aspirin  81 mg daily and Plavix  for 3 to 6 months and resuming high intensity moderate dose statins.    Echo 06/01/23:  1. Left ventricular ejection fraction, by estimation, is 45 to 50%. Left  ventricular ejection fraction by 3D volume is 46 %. The left ventricle has  mildly decreased function. The left ventricle demonstrates global  hypokinesis. Left ventricular diastolic   parameters are indeterminate.   2. Right ventricular systolic function is normal. The right ventricular  size is normal. Tricuspid regurgitation signal is inadequate for assessing  PA pressure.   3. The mitral valve is normal in structure. No evidence of mitral valve  regurgitation. No evidence of mitral stenosis.   4. The aortic valve is normal in structure. Aortic valve regurgitation is  not visualized. Aortic valve sclerosis is present, with no evidence of  aortic valve stenosis.   5. The inferior vena cava is normal in size with greater than 50%  respiratory variability, suggesting right atrial pressure of 3 mmHg.       Laboratory Data:  High Sensitivity Troponin:   Recent Labs  Lab 08/08/23 2144 08/08/23 2353 08/09/23 0210 08/09/23 0340  TROPONINIHS 27* 49* 80* 89*     Chemistry Recent Labs  Lab 08/08/23 2144  NA 142  K 4.0  CL 103  CO2 22  GLUCOSE 188*  BUN 17  CREATININE 1.50*  CALCIUM  9.6  GFRNONAA 35*  ANIONGAP 17*    Recent Labs  Lab 08/08/23 2144  PROT 6.8  ALBUMIN 4.3  AST 33  ALT 16  ALKPHOS 102  BILITOT 1.6*   Lipids No results for input(s): "CHOL", "TRIG", "HDL", "LABVLDL", "LDLCALC", "CHOLHDL" in the last 168 hours.  Hematology Recent Labs  Lab 08/08/23 2144  WBC 8.6  RBC 5.03  HGB 14.5  HCT 45.1  MCV 89.7  MCH 28.8  MCHC 32.2  RDW 14.2  PLT 164   Thyroid No results for input(s): "TSH", "FREET4" in the last 168 hours.  BNPNo results for  input(s): "BNP", "PROBNP" in the last 168 hours.  DDimer  Recent Labs  Lab 08/09/23 0150  DDIMER 0.44     Radiology/Studies:  DG Chest Port 1 View Result Date: 08/08/2023 CLINICAL DATA:  Sternal chest pain radiating to bilateral shoulders EXAM: PORTABLE CHEST 1 VIEW COMPARISON:  05/31/2023 FINDINGS: Single frontal view of the chest demonstrates an enlarged cardiac silhouette. Stable large hiatal hernia. No acute airspace disease, effusion, or pneumothorax. No acute bony abnormalities. IMPRESSION: 1. No acute intrathoracic process. 2. Large hiatal hernia. Electronically Signed   By: Bobbye Burrow M.D.   On: 08/08/2023 21:53     Assessment and Plan:    NSTEMI Nonobstructive CAD -Presented with recurrent resting chest pain since 445pm yesterday, continue to have 2-3/10 pain despite morphine /heparin  gtt; associated with diaphoresis, nausea, emesis  - last admitted for non-STEMI 05/2023, left heart cath 06/02/2023 with mild diffuse proximal LAD calcification and moderate focal calcification at the origin of D1, minimal proximal to mid circumflex disease, 20% proximal RCA stenosis; it was suspected she had unstable and ulcerated plaque in the proximal LAD however this was completely healed with TIMI-3 flow with no evidence of any contrast hanging up; she was recommended medical therapy with DAPT, statin, Toprol .  - Hs trop 27 >49 >80 >89 - EKG with no acute changes, old anteroseptal Q  - Will start Nitroglycerin  gtt given continued chest pain; continue heparin  gtt; resume PTA ASA 81mg , Plavix  75mg , lipitor  40mg , and metoprolol  XL 50mg   - suspect will need to repeat cath for exploration of pain etiology given unstable and ulcerated plaque suspected last cath, will defer further discussion to MD with the patient   NICM HFmrEF - Echo from 06/01/23 with LVEF 45 to 50%, global hypokinesis, indeterminate diastolic parameter, normal RV, aortic sclerosis -Clinically euvolemic, will check BNP, hold PTA  torsemide  20mg , monitor renal index  - GDMT: Started on Toprol  XL 50 mg, Entresto  49/51 mg twice daily, and Jardiance  10 mg from last admission in March 2025; Entresto  was stopped due to elevated creatinine;  - Creatinine baseline likely 1.3 per March 2025 labs, creatinine 1.5 with EGFR 35, POA; unclear if this is new baseline or developing AKI;  will trend for now and continue hold Entresto   CKDIIIb Type 2 DM HTN HLD  GERD/IBS -per primary team       Risk Assessment/Risk Scores:    [place]  Heart Association (NYHA) Functional Class NYHA Class I     For questions or updates, please contact [facility] Please consult [email or web address] for contact info under    Signed, Xika Zhao, NP  08/09/2023 9:15 AM

## 2023-08-09 NOTE — ED Notes (Signed)
 Confirmed with Pharm - Heparin  Level to be drawn at 8 hours post Hep infusion/bolus.

## 2023-08-09 NOTE — H&P (Addendum)
 History and Physical    Norely Schlick ZOX:[id number] DOB: 27-Feb-1946 DOA: 08/08/2023  PCP: Bentley Bray, PA  Patient coming from: Home  I have personally briefly reviewed patient's old medical records in [facility]  Chief Complaint: Chest pain  HPI: Anna Klein is a 78 y.o. female with medical history significant of nonobstructive CAD, NICM, CHF, hyperlipidemia, left bundle branch block, Guillain-Barr syndrome, type 2 diabetes, hypertension, CKD stage IIIb, GERD presented with chest pain that started yesterday evening.  Patient reports she started having midsternal chest pain that radiates to her left shoulder and neck and back that started suddenly while she was sitting around 4:45 PM.  Pain was persistent, 10 out of 10, pressure/sharp both in nature, associated with some shortness of breath.  She took nitro at home that helped a little bit.  Her daughter called EMS and brought to the ER for further eval and management.  No history of tobacco abuse, alcohol abuse, licit drug use.  She is compliant with all medications.  Of note: Patient admitted for non-ST MI on 05/2023.  Status post left heart cath on 06/02/2023.  She was recommended medical management with DAPT, statin and Toprol .  ED Course: Upon arrival to ED: Temperature 97.7, pulse 82, RR: 30, BP: 142/79, D-dimer within normal limits.  Troponin 27-49-80-89 NA: 142, K: 4.0, creatinine 1.50, GFR 35 anion gap 17.  Chest x-ray no acute findings.  Large hiatal hernia.  ED physician talked to cardiology recommended to start IV heparin .  [facility] hospitalist consulted for admission.  Review of Systems: As per HPI otherwise negative.    Past Medical History:  Diagnosis Date   DM (diabetes mellitus) (HCC)    GBS (Guillain Barre syndrome) (HCC)    Hypertension    Renal disorder    Stroke [facility])     Past Surgical History:  Procedure Laterality Date   CHOLECYSTECTOMY N/A 03/25/2014   Procedure: LAPAROSCOPIC CHOLECYSTECTOMY WITH  INTRAOPERATIVE CHOLANGIOGRAM;  Surgeon: Shela Derby, MD;  Location: [facility] OR;  Service: General;  Laterality: N/A;   LEFT HEART CATH AND CORONARY ANGIOGRAPHY N/A 06/02/2023   Procedure: LEFT HEART CATH AND CORONARY ANGIOGRAPHY;  Surgeon: Knox Perl, MD;  Location: [facility] INVASIVE CV LAB;  Service: Cardiovascular;  Laterality: N/A;     reports that she has never smoked. She does not have any smokeless tobacco history on file. She reports that she does not drink alcohol and does not use drugs.  Allergies  Allergen Reactions   Aspirin  Other (See Comments)    Due to kidney function; Patient is taking medication and is not allergic or sensitive to.    Nsaids Other (See Comments)    Cannot take due to kidney function.    Family History  Problem Relation Age of Onset   Hypertension Mother    Hypertension Father     Prior to Admission medications   Medication Sig Start Date End Date Taking? Authorizing Provider  acetaminophen  (TYLENOL ) 325 MG tablet Take 650 mg by mouth every 6 (six) hours as needed for mild pain (pain score 1-3).   Yes [provider]  ascorbic acid (VITAMIN C) 500 MG tablet Take 500 mg by mouth daily.   Yes [provider]  aspirin  EC 81 MG tablet Take 1 tablet (81 mg total) by mouth daily. Swallow whole. Patient taking differently: Take 81 mg by mouth at bedtime. Swallow whole. 06/04/23  Yes Ronni Colace, DO  atorvastatin  (LIPITOR ) 40 MG tablet Take 1 tablet (40 mg total) by  mouth daily. Patient taking differently: Take 40 mg by mouth at bedtime. 06/04/23  Yes Ronni Colace, DO  brimonidine (ALPHAGAN) 0.2 % ophthalmic solution Place 1 drop into both eyes 3 (three) times daily. 01/05/19  Yes [provider]  clopidogrel  (PLAVIX ) 75 MG tablet Take 1 tablet (75 mg total) by mouth daily with breakfast. 06/03/23  Yes Ronni Colace, DO  cyanocobalamin  (,VITAMIN B-12,) 1000 MCG/ML injection Inject 1 mL (1,000 mcg total) into the muscle once a week. 04/11/14   Yes Gherghe, Costin M, MD  dorzolamide -timolol  (COSOPT ) 2-0.5 % ophthalmic solution Place 1 drop into both eyes 2 (two) times daily.   Yes [provider]  empagliflozin  (JARDIANCE ) 10 MG TABS tablet Take 1 tablet (10 mg total) by mouth daily. 06/03/23  Yes Ronni Colace, DO  gabapentin  (NEURONTIN ) 300 MG capsule Take 300 mg by mouth at bedtime. 04/27/15  Yes [provider]  Magnesium  250 MG TABS Take 500 mg by mouth at bedtime.   Yes [provider]  melatonin 3 MG TABS tablet Take 3 mg by mouth at bedtime. 01/15/17  Yes [provider]  metoprolol  succinate (TOPROL  XL) 50 MG 24 hr tablet Take 1 tablet (50 mg total) by mouth daily. Take with or immediately following a meal. 06/03/23  Yes Ronni Colace, DO  Multiple Vitamins-Minerals (PRESERVISION AREDS) CAPS Take 1 capsule by mouth in the morning and at bedtime. 07/10/17  Yes [provider]  nitroGLYCERIN  (NITROSTAT ) 0.4 MG SL tablet Place 1 tablet (0.4 mg total) under the tongue every 5 (five) minutes as needed for chest pain. 06/03/23  Yes Ronni Colace, DO  pantoprazole  (PROTONIX ) 20 MG tablet Take 1 tablet (20 mg total) by mouth daily. 06/17/23  Yes Gerald Kitty., NP  potassium chloride  SA (KLOR-CON  M) 20 MEQ tablet Take 20 mEq by mouth daily.   Yes [provider]  ROCKLATAN 0.02-0.005 % SOLN Place 1 drop into both eyes at bedtime. 10/25/21  Yes [provider]  torsemide  (DEMADEX ) 10 MG tablet Take 10 mg by mouth daily. Can take an additional tablet as needed for weight gain   Yes [provider]  Vitamin A 3 MG (10000 UT) TABS Take 1 tablet by mouth daily.   Yes [provider]  hydrALAZINE  (APRESOLINE ) 25 MG tablet Take 1 tablet (25 mg total) by mouth every 8 (eight) hours. Patient not taking: Reported on 08/09/2023 03/30/14   Dhungel, Delman Ferns, MD  sacubitril -valsartan  (ENTRESTO ) 49-51 MG Take 1 tablet by mouth 2 (two) times daily. Patient not taking: Reported on  08/09/2023 06/03/23   Ronni Colace, DO    Physical Exam: Vitals:   08/09/23 0630 08/09/23 0700 08/09/23 0730 08/09/23 0800  BP: (!) 144/81 120/67 134/77 113/71  Pulse: 91 85 79 81  Resp: 13 (!) 22 20 18   Temp:      TempSrc:      SpO2: 100% 93% 100% 91%  Weight:      Height:        Constitutional: NAD, calm, comfortable, on room air, daughter at the bedside, communicating well Eyes: PERRL, lids and conjunctivae normal ENMT: Mucous membranes are moist. Posterior pharynx clear of any exudate or lesions.Normal dentition.  Neck: normal, supple, no masses, no thyromegaly Respiratory: clear to auscultation bilaterally, no wheezing, no crackles. Normal respiratory effort. No accessory muscle use.  Cardiovascular: Regular rate and rhythm, no murmurs / rubs / gallops. No extremity edema. 2+ pedal pulses. No carotid bruits.  Abdomen: no tenderness, no masses palpated.  No hepatosplenomegaly. Bowel sounds positive.  Musculoskeletal: no clubbing / cyanosis. No joint deformity upper and lower extremities. Good ROM, no contractures. Normal muscle tone.  Skin: no rashes, lesions, ulcers. No induration Neurologic: CN 2-12 grossly intact. Sensation intact, DTR normal. Strength 5/5 in all 4.  Psychiatric: Normal judgment and insight. Alert and oriented x 3. Normal mood.    Labs on Admission: I have personally reviewed following labs and imaging studies  CBC: Recent Labs  Lab 08/08/23 2144  WBC 8.6  HGB 14.5  HCT 45.1  MCV 89.7  PLT 164   Basic Metabolic Panel: Recent Labs  Lab 08/08/23 2144  NA 142  K 4.0  CL 103  CO2 22  GLUCOSE 188*  BUN 17  CREATININE 1.50*  CALCIUM  9.6   GFR: Estimated Creatinine Clearance: 27.7 mL/min (A) (by C-G formula based on SCr of 1.5 mg/dL (H)). Liver Function Tests: Recent Labs  Lab 08/08/23 2144  AST 33  ALT 16  ALKPHOS 102  BILITOT 1.6*  PROT 6.8  ALBUMIN 4.3   Recent Labs  Lab 08/08/23 2144  LIPASE 45   No results for input(s):  "AMMONIA" in the last 168 hours. Coagulation Profile: No results for input(s): "INR", "PROTIME" in the last 168 hours. Cardiac Enzymes: No results for input(s): "CKTOTAL", "CKMB", "CKMBINDEX", "TROPONINI" in the last 168 hours. BNP (last 3 results) No results for input(s): "PROBNP" in the last 8760 hours. HbA1C: No results for input(s): "HGBA1C" in the last 72 hours. CBG: No results for input(s): "GLUCAP" in the last 168 hours. Lipid Profile: No results for input(s): "CHOL", "HDL", "LDLCALC", "TRIG", "CHOLHDL", "LDLDIRECT" in the last 72 hours. Thyroid Function Tests: No results for input(s): "TSH", "T4TOTAL", "FREET4", "T3FREE", "THYROIDAB" in the last 72 hours. Anemia Panel: No results for input(s): "VITAMINB12", "FOLATE", "FERRITIN", "TIBC", "IRON", "RETICCTPCT" in the last 72 hours. Urine analysis:    Component Value Date/Time   COLORURINE YELLOW 05/08/2015 1834   APPEARANCEUR CLEAR 05/08/2015 1834   LABSPEC 1.007 05/08/2015 1834   PHURINE 6.0 05/08/2015 1834   GLUCOSEU NEGATIVE 05/08/2015 1834   HGBUR TRACE (A) 05/08/2015 1834   BILIRUBINUR NEGATIVE 05/08/2015 1834   KETONESUR NEGATIVE 05/08/2015 1834   PROTEINUR NEGATIVE 05/08/2015 1834   UROBILINOGEN 0.2 03/31/2014 2349   NITRITE NEGATIVE 05/08/2015 1834   LEUKOCYTESUR SMALL (A) 05/08/2015 1834    Radiological Exams on Admission: DG Chest Port 1 View Result Date: 08/08/2023 CLINICAL DATA:  Sternal chest pain radiating to bilateral shoulders EXAM: PORTABLE CHEST 1 VIEW COMPARISON:  05/31/2023 FINDINGS: Single frontal view of the chest demonstrates an enlarged cardiac silhouette. Stable large hiatal hernia. No acute airspace disease, effusion, or pneumothorax. No acute bony abnormalities. IMPRESSION: 1. No acute intrathoracic process. 2. Large hiatal hernia. Electronically Signed   By: Bobbye Burrow M.D.   On: 08/08/2023 21:53    EKG: Independently reviewed.  Normal sinus rhythm, prolonged QT interval, old anteroseptal  infarct  Assessment/Plan   Chest pain Elevated troponin: - History of nonobstructive CAD status post left heart cath on 06/02/2023. - On IV heparin .  Cardiology on board appreciate help.  Reviewed EKG. - On nitro gtt.  Continue aspirin , Plavix , statin, metoprolol  - Per cardiology left heart cath planned on Tuesday, 08/12/2023.  NICM HFrEF: - Reviewed echo from 06/01/2023 showed ejection fraction of 45 to 50%, indeterminate diastolic parameters.  Patient appears euvolemic on exam.  BNP is pending. - Will continue home medications statin, Toprol , Jardiance .  Hold on Entresto  at this time  CKD stage  IIIb: - Continue to monitor  Type 2 diabetes: Check A1c.  Start sliding scale insulin   Hypertension: Continue home medications-Toprol   Hyperlipidemia: Continue statin  GERD: Continue PPI  History of Guillain-Barr syndrome: Stable   DVT prophylaxis: Full dose heparin  Code Status: Full code Family Communication: Patient's daughter  present at bedside.  Plan of care discussed with patient in length and he verbalized understanding and agreed with it. Disposition Plan: Likely home Consults called: Cardiology  admission status: In patient  Elester Grim MD [facility]  If 7PM-7AM, please contact night-coverage [email or web address]  08/09/2023, 9:24 AM

## 2023-08-10 DIAGNOSIS — I200 Unstable angina: Secondary | ICD-10-CM | POA: Diagnosis not present

## 2023-08-10 DIAGNOSIS — E876 Hypokalemia: Secondary | ICD-10-CM

## 2023-08-10 DIAGNOSIS — I214 Non-ST elevation (NSTEMI) myocardial infarction: Secondary | ICD-10-CM | POA: Diagnosis not present

## 2023-08-10 DIAGNOSIS — N1832 Chronic kidney disease, stage 3b: Secondary | ICD-10-CM | POA: Diagnosis not present

## 2023-08-10 LAB — HEPARIN LEVEL (UNFRACTIONATED): Heparin Unfractionated: 0.31 [IU]/mL (ref 0.30–0.70)

## 2023-08-10 LAB — BASIC METABOLIC PANEL WITH GFR
Anion gap: 8 (ref 5–15)
BUN: 20 mg/dL (ref 8–23)
CO2: 25 mmol/L (ref 22–32)
Calcium: 8.6 mg/dL — ABNORMAL LOW (ref 8.9–10.3)
Chloride: 105 mmol/L (ref 98–111)
Creatinine, Ser: 1.35 mg/dL — ABNORMAL HIGH (ref 0.44–1.00)
GFR, Estimated: 40 mL/min — ABNORMAL LOW (ref >60)
Glucose, Bld: 106 mg/dL — ABNORMAL HIGH (ref 70–99)
Potassium: 3.3 mmol/L — ABNORMAL LOW (ref 3.5–5.1)
Sodium: 138 mmol/L (ref 135–145)

## 2023-08-10 LAB — CBC
HCT: 36.4 % (ref 36.0–46.0)
Hemoglobin: 11.5 g/dL — ABNORMAL LOW (ref 12.0–15.0)
MCH: 28.7 pg (ref 26.0–34.0)
MCHC: 31.6 g/dL (ref 30.0–36.0)
MCV: 90.8 fL (ref 80.0–100.0)
Platelets: 149 10*3/uL — ABNORMAL LOW (ref 150–400)
RBC: 4.01 MIL/uL (ref 3.87–5.11)
RDW: 14.6 % (ref 11.5–15.5)
WBC: 8.9 10*3/uL (ref 4.0–10.5)
nRBC: 0 % (ref 0.0–0.2)

## 2023-08-10 LAB — GLUCOSE, CAPILLARY
Glucose-Capillary: 106 mg/dL — ABNORMAL HIGH (ref 70–99)
Glucose-Capillary: 96 mg/dL (ref 70–99)
Glucose-Capillary: 100 mg/dL — ABNORMAL HIGH (ref 70–99)
Glucose-Capillary: 116 mg/dL — ABNORMAL HIGH (ref 70–99)

## 2023-08-10 LAB — HEMOGLOBIN A1C
Hgb A1c MFr Bld: 5.5 % (ref 4.8–5.6)
Mean Plasma Glucose: 111.15 mg/dL

## 2023-08-10 MED ORDER — POTASSIUM CHLORIDE CRYS ER 20 MEQ PO TBCR
40.0000 meq | EXTENDED_RELEASE_TABLET | Freq: Once | ORAL | Status: AC
  Administered 2023-08-10: 40 meq via ORAL
  Filled 2023-08-10: qty 2

## 2023-08-10 MED ORDER — POTASSIUM CHLORIDE 20 MEQ PO PACK
40.0000 meq | PACK | Freq: Once | ORAL | Status: DC
  Filled 2023-08-10: qty 2

## 2023-08-10 NOTE — Plan of Care (Signed)
  Problem: Metabolic: Goal: Ability to maintain appropriate glucose levels will improve 08/10/2023 1308 by Don Fritter, RN Outcome: Not Progressing 08/10/2023 1307 by Don Fritter, RN Outcome: Progressing   Problem: Skin Integrity: Goal: Risk for impaired skin integrity will decrease 08/10/2023 1308 by Don Fritter, RN Outcome: Not Progressing 08/10/2023 1307 by Don Fritter, RN Outcome: Progressing   Problem: Nutritional: Goal: Maintenance of adequate nutrition will improve 08/10/2023 1308 by Don Fritter, RN Outcome: Not Progressing 08/10/2023 1307 by Don Fritter, RN Outcome: Progressing

## 2023-08-10 NOTE — Progress Notes (Signed)
 Rounding Note    Patient Name: Anna Klein Date of Encounter: 08/10/2023  [facility] Cardiologist: Dr Lorie Rook  Subjective   No CP or dyspnea  Inpatient Medications    Scheduled Meds:  aspirin  EC  81 mg Oral Daily   atorvastatin   40 mg Oral Daily   clopidogrel   75 mg Oral Q breakfast   empagliflozin   10 mg Oral Daily   gabapentin   300 mg Oral QHS   insulin  aspart  0-15 Units Subcutaneous TID WC   insulin  aspart  0-5 Units Subcutaneous QHS   melatonin  3 mg Oral QHS   metoprolol  succinate  50 mg Oral Daily   pantoprazole   20 mg Oral Daily   potassium chloride   40 mEq Oral Once   Continuous Infusions:  heparin  900 Units/hr (08/10/23 0546)   nitroGLYCERIN  Stopped (08/09/23 1640)   PRN Meds: acetaminophen  **OR** acetaminophen , alum & mag hydroxide-simeth, ondansetron  **OR** ondansetron  (ZOFRAN ) IV   Vital Signs    Vitals:   08/09/23 2050 08/09/23 2349 08/10/23 0540 08/10/23 0801  BP: (!) 102/57 (!) 115/57 (!) 124/58 123/65  Pulse: 71 68 64 78  Resp: 20 17 18 18   Temp: 98.4 F (36.9 C) 98.2 F (36.8 C) 97.7 F (36.5 C) 97.8 F (36.6 C)  TempSrc: Oral Oral Oral Oral  SpO2: 94% 94% 93% 95%  Weight:      Height:        Intake/Output Summary (Last 24 hours) at 08/10/2023 0849 Last data filed at 08/10/2023 0546 Gross per 24 hour  Intake 499.53 ml  Output --  Net 499.53 ml      08/09/2023    2:00 PM 08/08/2023    9:23 PM 06/17/2023    8:46 AM  Last 3 Weights  Weight (lbs) 160 lb 1.6 oz 160 lb 162 lb 3.2 oz  Weight (kg) 72.621 kg 72.576 kg 73.573 kg      Telemetry    Sinus with PVCs- Personally Reviewed   Physical Exam   GEN: No acute distress.   Neck: No JVD Cardiac: RRR Respiratory: Clear to auscultation bilaterally. GI: Soft, nontender, non-distended  MS: No edema Neuro:  Nonfocal  Psych: Normal affect   Labs    High Sensitivity Troponin:   Recent Labs  Lab 08/08/23 2144 08/08/23 2353 08/09/23 0210 08/09/23 0340   TROPONINIHS 27* 49* 80* 89*     Chemistry Recent Labs  Lab 08/08/23 2144 08/10/23 0222  NA 142 138  K 4.0 3.3*  CL 103 105  CO2 22 25  GLUCOSE 188* 106*  BUN 17 20  CREATININE 1.50* 1.35*  CALCIUM  9.6 8.6*  PROT 6.8  --   ALBUMIN 4.3  --   AST 33  --   ALT 16  --   ALKPHOS 102  --   BILITOT 1.6*  --   GFRNONAA 35* 40*  ANIONGAP 17* 8     Hematology Recent Labs  Lab 08/08/23 2144 08/10/23 0222  WBC 8.6 8.9  RBC 5.03 4.01  HGB 14.5 11.5*  HCT 45.1 36.4  MCV 89.7 90.8  MCH 28.8 28.7  MCHC 32.2 31.6  RDW 14.2 14.6  PLT 164 149*   BNP Recent Labs  Lab 08/09/23 1027  BNP 616.1*    DDimer  Recent Labs  Lab 08/09/23 0150  DDIMER 0.44     Radiology    DG Chest Port 1 View Result Date: 08/08/2023 CLINICAL DATA:  Sternal chest pain radiating to bilateral shoulders EXAM: PORTABLE CHEST 1  VIEW COMPARISON:  05/31/2023 FINDINGS: Single frontal view of the chest demonstrates an enlarged cardiac silhouette. Stable large hiatal hernia. No acute airspace disease, effusion, or pneumothorax. No acute bony abnormalities. IMPRESSION: 1. No acute intrathoracic process. 2. Large hiatal hernia. Electronically Signed   By: Bobbye Burrow M.D.   On: 08/08/2023 21:53     Patient Profile     78 year old female with past medical history of hypertension, hyperlipidemia, chronic stage IIIb kidney disease, gastroesophageal reflux disease, recent admission with non-ST elevation microinfarction for evaluation of chest pain/recurrent non-ST elevation myocardial infarction.  Patient was admitted March 2025 with non-ST elevation myocardial infarction with peak troponin 2210.  Echocardiogram showed ejection fraction 45 to 50% and there appears to be hypokinesis of the anteroseptal wall and apex.  Cardiac catheterization March 2025 showed ejection fraction 45% with anterior and apical hypokinesis, moderate stenosis in the proximal LAD and 20% right coronary artery.  This was felt to be an  unstable/ulcerated plaque in the proximal LAD but there was TIMI III flow and medical therapy recommended.  Now with recurrent CP.  Assessment & Plan    1 non-ST elevation myocardial infarction-patient remains pain-free this morning.  I did review her films with Dr. Addie Holstein yesterday.  She had an hazy area in the proximal LAD at time of catheterization in March which was treated medically.  However now with recurrent symptoms feel repeat catheterization is indicated to reassess.  Note her troponin is minimally elevated as well.  Continue aspirin , Plavix , heparin , nitroglycerin , Toprol  and statin.  Plan will be catheterization on Tuesday or sooner if symptoms worsen.     2 hyperlipidemia-continue statin.   3 hypertension-patient's blood pressure is controlled.  Continue present medications.   4 chronic stage IIIb kidney disease-renal function slightly improved this morning.  Will hydrate prior to procedure and no ventriculogram.  Follow renal function afterwards.  5 hypokalemia-supplement.  For questions or updates, please contact [facility] Please consult [email or web address] for contact info under        Signed, Alexandria Angel, MD  08/10/2023, 8:49 AM

## 2023-08-10 NOTE — Progress Notes (Signed)
 PROGRESS NOTE    Anna Klein  NWG:[id number] DOB: 04-09-1945 DOA: 08/08/2023 PCP: Bentley Bray, PA    Brief Narrative:  This 78 years old female with PMH significant for nonobstructive CAD, NICM, CHF, hyperlipidemia, left bundle branch block, GB syndrome, type 2 diabetes, hypertension, CKD stage IIIb, GERD presented in the ED with chest pain that started yesterday. Pain radiating towards left shoulder and neck and back.  Chest pain started while she was at rest,  describes chest pain as 10/10 on the pain scale associated with some shortness of breath. She has taken some nitro which resolved some pain.  Patient was recently admitted in March with NSTEMI status post left catheterization and she was recommended medical management with DAPT and statin and Toprol .  Patient was admitted for further evaluation. Cardiology was consulted.  Patient is scheduled to have left heart cath on 08/12/2023.  Assessment & Plan:   Principal Problem:   Chest pain  Chest pain: Elevated troponin: Patient with history of nonobstructive CAD , status post left heart cath on 06/02/2023. Continue IV heparin  for now,  Cardiology is consulted.  EKG > No significant changes. Patient continued on nitro gtt., Continue aspirin ,  Plavix ,  statin and metoprolol . Patient is already scheduled to have left heart cath on 08/12/2023.  NICM HFrEF: Echo showed ejection fraction of 45 to 50%, indeterminate diastolic parameters.   Patient appears euvolemic on exam.  BNP 616 Continue statin, Toprol , Jardiance .  Hold on Entresto  at this time   CKD stage IIIb: Serum creatinine at baseline.   Continue to monitor serum creatinine.   Type 2 diabetes: Check A1c.  Start sliding scale insulin    Hypertension:  Continue home medications-Toprol .   Hyperlipidemia:  Continue Lipitor  40 mg daily.   GERD:  Continue Protonix  40 mg daily.   History of Guillain-Barr syndrome: Stable.  Hypokalemia: Replaced.  Continue to  monitor.  DVT prophylaxis: Heparin  Code Status: Full code Family Communication: Granddaughter at bedside Disposition Plan:    Status is: Inpatient Remains inpatient appropriate because: Admitted for chest pain,  Patient is scheduled for left heart cath on 5/27.    Consultants:  Cardiology  Procedures:  Antimicrobials:  Anti-infectives (From admission, onward)    None       Subjective: Patient was seen and examined at bedside.  Overnight events noted.   Objective: Vitals:   08/10/23 0540 08/10/23 0801 08/10/23 0959 08/10/23 1117  BP: (!) 124/58 123/65 (!) 118/53 138/63  Pulse: 64 78 67 77  Resp: 18 18  18   Temp: 97.7 F (36.5 C) 97.8 F (36.6 C)  97.7 F (36.5 C)  TempSrc: Oral Oral  Oral  SpO2: 93% 95%  99%  Weight:      Height:        Intake/Output Summary (Last 24 hours) at 08/10/2023 1134 Last data filed at 08/10/2023 1100 Gross per 24 hour  Intake 679.53 ml  Output --  Net 679.53 ml   Filed Weights   08/08/23 2123 08/09/23 1400  Weight: 72.6 kg 72.6 kg    Examination:  General exam: Appears calm and comfortable, not in any acute distress Respiratory system: Clear to auscultation. Respiratory effort normal..  RR 16 Cardiovascular system: S1 & S2 heard, RRR. No JVD, murmurs, rubs, gallops or clicks.  Gastrointestinal system: Abdomen is non distended, soft and non tender. Normal bowel sounds heard. Central nervous system: Alert and oriented X 3. No focal neurological deficits. Extremities: No edema, no cyanosis, no clubbing Skin: No rashes, lesions  or ulcers Psychiatry: Judgement and insight appear normal. Mood & affect appropriate.     Data Reviewed: I have personally reviewed following labs and imaging studies  CBC: Recent Labs  Lab 08/08/23 2144 08/10/23 0222  WBC 8.6 8.9  HGB 14.5 11.5*  HCT 45.1 36.4  MCV 89.7 90.8  PLT 164 149*   Basic Metabolic Panel: Recent Labs  Lab 08/08/23 2144 08/10/23 0222  NA 142 138  K 4.0 3.3*  CL  103 105  CO2 22 25  GLUCOSE 188* 106*  BUN 17 20  CREATININE 1.50* 1.35*  CALCIUM  9.6 8.6*   GFR: Estimated Creatinine Clearance: 30 mL/min (A) (by C-G formula based on SCr of 1.35 mg/dL (H)). Liver Function Tests: Recent Labs  Lab 08/08/23 2144  AST 33  ALT 16  ALKPHOS 102  BILITOT 1.6*  PROT 6.8  ALBUMIN 4.3   Recent Labs  Lab 08/08/23 2144  LIPASE 45   No results for input(s): "AMMONIA" in the last 168 hours. Coagulation Profile: No results for input(s): "INR", "PROTIME" in the last 168 hours. Cardiac Enzymes: No results for input(s): "CKTOTAL", "CKMB", "CKMBINDEX", "TROPONINI" in the last 168 hours. BNP (last 3 results) No results for input(s): "PROBNP" in the last 8760 hours. HbA1C: Recent Labs    08/10/23 0222  HGBA1C 5.5   CBG: Recent Labs  Lab 08/09/23 1132 08/09/23 1751 08/09/23 2121 08/10/23 0758 08/10/23 1119  GLUCAP 127* 109* 113* 106* 96   Lipid Profile: No results for input(s): "CHOL", "HDL", "LDLCALC", "TRIG", "CHOLHDL", "LDLDIRECT" in the last 72 hours. Thyroid Function Tests: No results for input(s): "TSH", "T4TOTAL", "FREET4", "T3FREE", "THYROIDAB" in the last 72 hours. Anemia Panel: No results for input(s): "VITAMINB12", "FOLATE", "FERRITIN", "TIBC", "IRON", "RETICCTPCT" in the last 72 hours. Sepsis Labs: No results for input(s): "PROCALCITON", "LATICACIDVEN" in the last 168 hours.  No results found for this or any previous visit (from the past 240 hours).   Radiology Studies: DG Chest Port 1 View Result Date: 08/08/2023 CLINICAL DATA:  Sternal chest pain radiating to bilateral shoulders EXAM: PORTABLE CHEST 1 VIEW COMPARISON:  05/31/2023 FINDINGS: Single frontal view of the chest demonstrates an enlarged cardiac silhouette. Stable large hiatal hernia. No acute airspace disease, effusion, or pneumothorax. No acute bony abnormalities. IMPRESSION: 1. No acute intrathoracic process. 2. Large hiatal hernia. Electronically Signed   By:  Bobbye Burrow M.D.   On: 08/08/2023 21:53   Scheduled Meds:  aspirin  EC  81 mg Oral Daily   atorvastatin   40 mg Oral Daily   clopidogrel   75 mg Oral Q breakfast   empagliflozin   10 mg Oral Daily   gabapentin   300 mg Oral QHS   insulin  aspart  0-15 Units Subcutaneous TID WC   insulin  aspart  0-5 Units Subcutaneous QHS   melatonin  3 mg Oral QHS   metoprolol  succinate  50 mg Oral Daily   pantoprazole   20 mg Oral Daily   Continuous Infusions:  heparin  900 Units/hr (08/10/23 0546)   nitroGLYCERIN  Stopped (08/09/23 1640)     LOS: 1 day    Time spent: 50 mins    Magdalene School, MD [facility]   If 7PM-7AM, please contact night-coverage

## 2023-08-10 NOTE — Progress Notes (Signed)
 ANTICOAGULATION CONSULT NOTE  Pharmacy Consult for Heparin  Indication: chest pain/ACS  Allergies  Allergen Reactions   Aspirin  Other (See Comments)    Due to kidney function; Patient is taking medication and is not allergic or sensitive to.    Nsaids Other (See Comments)    Cannot take due to kidney function.    Patient Measurements: Height: 5' (152.4 cm) Weight: 72.6 kg (160 lb 1.6 oz) IBW/kg (Calculated) : 45.5 Heparin  Dosing Weight: 63.6 kg  Vital Signs: Temp: 97.7 F (36.5 C) (05/25 0540) Temp Source: Oral (05/25 0540) BP: 124/58 (05/25 0540) Pulse Rate: 64 (05/25 0540)  Labs: Recent Labs    08/08/23 2144 08/08/23 2353 08/09/23 0210 08/09/23 0340 08/09/23 1124 08/09/23 1904 08/10/23 0222  HGB 14.5  --   --   --   --   --  11.5*  HCT 45.1  --   --   --   --   --  36.4  PLT 164  --   --   --   --   --  149*  HEPARINUNFRC  --   --   --   --  0.36 0.30 0.31  CREATININE 1.50*  --   --   --   --   --  1.35*  TROPONINIHS 27* 49* 80* 89*  --   --   --     Estimated Creatinine Clearance: 30 mL/min (A) (by C-G formula based on SCr of 1.35 mg/dL (H)).   Medical History: Past Medical History:  Diagnosis Date   DM (diabetes mellitus) (HCC)    GBS (Guillain Barre syndrome) (HCC)    Hypertension    Renal disorder    Stroke (HCC)     Medications:  Medications Prior to Admission  Medication Sig Dispense Refill Last Dose/Taking   acetaminophen  (TYLENOL ) 325 MG tablet Take 650 mg by mouth every 6 (six) hours as needed for mild pain (pain score 1-3).   Past Week   ascorbic acid (VITAMIN C) 500 MG tablet Take 500 mg by mouth daily.   08/08/2023 Morning   aspirin  EC 81 MG tablet Take 1 tablet (81 mg total) by mouth daily. Swallow whole. (Patient taking differently: Take 81 mg by mouth at bedtime. Swallow whole.) 30 tablet 0 08/08/2023 Bedtime   atorvastatin  (LIPITOR ) 40 MG tablet Take 1 tablet (40 mg total) by mouth daily. (Patient taking differently: Take 40 mg by mouth at  bedtime.) 30 tablet 0 08/08/2023 Bedtime   brimonidine (ALPHAGAN) 0.2 % ophthalmic solution Place 1 drop into both eyes 3 (three) times daily.   08/08/2023 Noon   clopidogrel  (PLAVIX ) 75 MG tablet Take 1 tablet (75 mg total) by mouth daily with breakfast. 30 tablet 0 08/08/2023 Morning   cyanocobalamin  (,VITAMIN B-12,) 1000 MCG/ML injection Inject 1 mL (1,000 mcg total) into the muscle once a week. 1 mL 0 08/08/2023 Morning   dorzolamide -timolol  (COSOPT ) 2-0.5 % ophthalmic solution Place 1 drop into both eyes 2 (two) times daily.   08/08/2023 Morning   empagliflozin  (JARDIANCE ) 10 MG TABS tablet Take 1 tablet (10 mg total) by mouth daily. 30 tablet 0 08/08/2023 Morning   gabapentin  (NEURONTIN ) 300 MG capsule Take 300 mg by mouth at bedtime.   Past Week   Magnesium  250 MG TABS Take 500 mg by mouth at bedtime.   Past Week   melatonin 3 MG TABS tablet Take 3 mg by mouth at bedtime.   Past Week   metoprolol  succinate (TOPROL  XL) 50 MG 24 hr tablet  Take 1 tablet (50 mg total) by mouth daily. Take with or immediately following a meal. 30 tablet 0 08/08/2023 Morning   Multiple Vitamins-Minerals (PRESERVISION AREDS) CAPS Take 1 capsule by mouth in the morning and at bedtime.   08/08/2023 Morning   nitroGLYCERIN  (NITROSTAT ) 0.4 MG SL tablet Place 1 tablet (0.4 mg total) under the tongue every 5 (five) minutes as needed for chest pain. 25 tablet 0 08/08/2023 Evening   pantoprazole  (PROTONIX ) 20 MG tablet Take 1 tablet (20 mg total) by mouth daily. 30 tablet 4 08/08/2023 Morning   potassium chloride  SA (KLOR-CON  M) 20 MEQ tablet Take 20 mEq by mouth daily.   08/08/2023 Morning   ROCKLATAN 0.02-0.005 % SOLN Place 1 drop into both eyes at bedtime.   Past Week   torsemide  (DEMADEX ) 10 MG tablet Take 10 mg by mouth daily. Can take an additional tablet as needed for weight gain   08/08/2023 Morning   Vitamin A 3 MG (10000 UT) TABS Take 1 tablet by mouth daily.   08/08/2023 Morning   [Paused] hydrALAZINE  (APRESOLINE ) 25 MG  tablet Take 1 tablet (25 mg total) by mouth every 8 (eight) hours. (Patient not taking: Reported on 08/09/2023) 60 tablet 0 Unknown   sacubitril -valsartan  (ENTRESTO ) 49-51 MG Take 1 tablet by mouth 2 (two) times daily. (Patient not taking: Reported on 08/09/2023) 60 tablet 0 Not Taking   Scheduled:   aspirin  EC  81 mg Oral Daily   atorvastatin   40 mg Oral Daily   clopidogrel   75 mg Oral Q breakfast   empagliflozin   10 mg Oral Daily   gabapentin   300 mg Oral QHS   insulin  aspart  0-15 Units Subcutaneous TID WC   insulin  aspart  0-5 Units Subcutaneous QHS   melatonin  3 mg Oral QHS   metoprolol  succinate  50 mg Oral Daily   pantoprazole   20 mg Oral Daily   Infusions:   heparin  900 Units/hr (08/10/23 0546)   nitroGLYCERIN  Stopped (08/09/23 1640)   PRN: acetaminophen  **OR** acetaminophen , alum & mag hydroxide-simeth, ondansetron  **OR** ondansetron  (ZOFRAN ) IV  Assessment: 78yo female c/o CP radiating to shoulders and associated w/ N/V/SOB. Heparin  per pharmacy consult placed for chest pain/ACS. LHC planned for 08/12/23.Patient was not on anticoagulation prior to arrival.   Heparin  level this AM is 0.31 which is on the lower end of therapeutic but has remained therapeutic x 3 on 900 units/hr. Rate was never increased overnight. Hgb 14.5 > 11.5, plt 164 > 149. No signs of bleeding noted.   Goal of Therapy:  Heparin  level 0.3-0.7 units/ml Monitor platelets by anticoagulation protocol: Yes   Plan:  Continue heparin  900 units/hr  Daily heparin  level while on heparin  Continue to monitor H&H and platelets  Thank you for involving pharmacy in the patient's care.   Barbra Boone, PharmD PGY1 Acute Care Pharmacy Resident  08/10/2023 7:05 AM

## 2023-08-10 NOTE — Plan of Care (Signed)
  Problem: Skin Integrity: Goal: Risk for impaired skin integrity will decrease Outcome: Progressing   Problem: Nutritional: Goal: Maintenance of adequate nutrition will improve Outcome: Progressing   Problem: Nutrition: Goal: Adequate nutrition will be maintained Outcome: Progressing   Problem: Activity: Goal: Risk for activity intolerance will decrease Outcome: Progressing   Problem: Pain Managment: Goal: General experience of comfort will improve and/or be controlled Outcome: Progressing   Problem: Elimination: Goal: Will not experience complications related to bowel motility Outcome: Progressing

## 2023-08-11 ENCOUNTER — Inpatient Hospital Stay (HOSPITAL_COMMUNITY)

## 2023-08-11 DIAGNOSIS — I214 Non-ST elevation (NSTEMI) myocardial infarction: Secondary | ICD-10-CM

## 2023-08-11 DIAGNOSIS — I200 Unstable angina: Secondary | ICD-10-CM | POA: Diagnosis not present

## 2023-08-11 LAB — CBC
HCT: 36.7 % (ref 36.0–46.0)
Hemoglobin: 11.5 g/dL — ABNORMAL LOW (ref 12.0–15.0)
MCH: 28.8 pg (ref 26.0–34.0)
MCHC: 31.3 g/dL (ref 30.0–36.0)
MCV: 91.8 fL (ref 80.0–100.0)
Platelets: 143 10*3/uL — ABNORMAL LOW (ref 150–400)
RBC: 4 MIL/uL (ref 3.87–5.11)
RDW: 14.4 % (ref 11.5–15.5)
WBC: 5.8 10*3/uL (ref 4.0–10.5)
nRBC: 0 % (ref 0.0–0.2)

## 2023-08-11 LAB — ECHOCARDIOGRAM COMPLETE
AR max vel: 1.33 cm2
AV Area VTI: 1.2 cm2
AV Area mean vel: 1.37 cm2
AV Mean grad: 8 mmHg
AV Peak grad: 13.9 mmHg
Ao pk vel: 1.87 m/s
Area-P 1/2: 5.13 cm2
Est EF: 55
Height: 60 in
S' Lateral: 2.9 cm
Weight: 2561.6 [oz_av]

## 2023-08-11 LAB — GLUCOSE, CAPILLARY
Glucose-Capillary: 91 mg/dL (ref 70–99)
Glucose-Capillary: 124 mg/dL — ABNORMAL HIGH (ref 70–99)
Glucose-Capillary: 74 mg/dL (ref 70–99)
Glucose-Capillary: 111 mg/dL — ABNORMAL HIGH (ref 70–99)

## 2023-08-11 LAB — HEPARIN LEVEL (UNFRACTIONATED): Heparin Unfractionated: 0.34 [IU]/mL (ref 0.30–0.70)

## 2023-08-11 MED ORDER — SODIUM CHLORIDE 0.9 % WEIGHT BASED INFUSION
1.0000 mL/kg/h | INTRAVENOUS | Status: DC
  Administered 2023-08-12: 1 mL/kg/h via INTRAVENOUS

## 2023-08-11 MED ORDER — SODIUM CHLORIDE 0.9 % WEIGHT BASED INFUSION
3.0000 mL/kg/h | INTRAVENOUS | Status: DC
  Administered 2023-08-12: 3 mL/kg/h via INTRAVENOUS

## 2023-08-11 MED ORDER — ASPIRIN 81 MG PO CHEW
81.0000 mg | CHEWABLE_TABLET | ORAL | Status: AC
  Administered 2023-08-12: 81 mg via ORAL
  Filled 2023-08-11: qty 1

## 2023-08-11 NOTE — Progress Notes (Signed)
 ANTICOAGULATION CONSULT NOTE  Pharmacy Consult for Heparin  Indication: chest pain/ACS  Allergies  Allergen Reactions   Aspirin  Other (See Comments)    Due to kidney function; Patient is taking medication and is not allergic or sensitive to.    Nsaids Other (See Comments)    Cannot take due to kidney function.    Patient Measurements: Height: 5' (152.4 cm) Weight: 72.6 kg (160 lb 1.6 oz) IBW/kg (Calculated) : 45.5 Heparin  Dosing Weight: 63.6 kg  Vital Signs: Temp: 97.4 F (36.3 C) (05/26 0823) Temp Source: Oral (05/26 0823) BP: 130/70 (05/26 0823) Pulse Rate: 71 (05/26 0823)  Labs: Recent Labs    08/08/23 2144 08/08/23 2353 08/09/23 0210 08/09/23 0340 08/09/23 1124 08/09/23 1904 08/10/23 0222 08/11/23 0439  HGB 14.5  --   --   --   --   --  11.5* 11.5*  HCT 45.1  --   --   --   --   --  36.4 36.7  PLT 164  --   --   --   --   --  149* 143*  HEPARINUNFRC  --   --   --   --    < > 0.30 0.31 0.34  CREATININE 1.50*  --   --   --   --   --  1.35*  --   TROPONINIHS 27* 49* 80* 89*  --   --   --   --    < > = values in this interval not displayed.    Estimated Creatinine Clearance: 30 mL/min (A) (by C-G formula based on SCr of 1.35 mg/dL (H)).   Medical History: Past Medical History:  Diagnosis Date   DM (diabetes mellitus) (HCC)    GBS (Guillain Barre syndrome) (HCC)    Hypertension    Renal disorder    Stroke (HCC)     Medications:  Medications Prior to Admission  Medication Sig Dispense Refill Last Dose/Taking   acetaminophen  (TYLENOL ) 325 MG tablet Take 650 mg by mouth every 6 (six) hours as needed for mild pain (pain score 1-3).   Past Week   ascorbic acid (VITAMIN C) 500 MG tablet Take 500 mg by mouth daily.   08/08/2023 Morning   aspirin  EC 81 MG tablet Take 1 tablet (81 mg total) by mouth daily. Swallow whole. (Patient taking differently: Take 81 mg by mouth at bedtime. Swallow whole.) 30 tablet 0 08/08/2023 Bedtime   atorvastatin  (LIPITOR ) 40 MG tablet  Take 1 tablet (40 mg total) by mouth daily. (Patient taking differently: Take 40 mg by mouth at bedtime.) 30 tablet 0 08/08/2023 Bedtime   brimonidine (ALPHAGAN) 0.2 % ophthalmic solution Place 1 drop into both eyes 3 (three) times daily.   08/08/2023 Noon   clopidogrel  (PLAVIX ) 75 MG tablet Take 1 tablet (75 mg total) by mouth daily with breakfast. 30 tablet 0 08/08/2023 Morning   cyanocobalamin  (,VITAMIN B-12,) 1000 MCG/ML injection Inject 1 mL (1,000 mcg total) into the muscle once a week. 1 mL 0 08/08/2023 Morning   dorzolamide -timolol  (COSOPT ) 2-0.5 % ophthalmic solution Place 1 drop into both eyes 2 (two) times daily.   08/08/2023 Morning   empagliflozin  (JARDIANCE ) 10 MG TABS tablet Take 1 tablet (10 mg total) by mouth daily. 30 tablet 0 08/08/2023 Morning   gabapentin  (NEURONTIN ) 300 MG capsule Take 300 mg by mouth at bedtime.   Past Week   Magnesium  250 MG TABS Take 500 mg by mouth at bedtime.   Past Week   melatonin  3 MG TABS tablet Take 3 mg by mouth at bedtime.   Past Week   metoprolol  succinate (TOPROL  XL) 50 MG 24 hr tablet Take 1 tablet (50 mg total) by mouth daily. Take with or immediately following a meal. 30 tablet 0 08/08/2023 Morning   Multiple Vitamins-Minerals (PRESERVISION AREDS) CAPS Take 1 capsule by mouth in the morning and at bedtime.   08/08/2023 Morning   nitroGLYCERIN  (NITROSTAT ) 0.4 MG SL tablet Place 1 tablet (0.4 mg total) under the tongue every 5 (five) minutes as needed for chest pain. 25 tablet 0 08/08/2023 Evening   pantoprazole  (PROTONIX ) 20 MG tablet Take 1 tablet (20 mg total) by mouth daily. 30 tablet 4 08/08/2023 Morning   potassium chloride  SA (KLOR-CON  M) 20 MEQ tablet Take 20 mEq by mouth daily.   08/08/2023 Morning   ROCKLATAN 0.02-0.005 % SOLN Place 1 drop into both eyes at bedtime.   Past Week   torsemide  (DEMADEX ) 10 MG tablet Take 10 mg by mouth daily. Can take an additional tablet as needed for weight gain   08/08/2023 Morning   Vitamin A 3 MG (10000 UT) TABS  Take 1 tablet by mouth daily.   08/08/2023 Morning   [Paused] hydrALAZINE  (APRESOLINE ) 25 MG tablet Take 1 tablet (25 mg total) by mouth every 8 (eight) hours. (Patient not taking: Reported on 08/09/2023) 60 tablet 0 Unknown   sacubitril -valsartan  (ENTRESTO ) 49-51 MG Take 1 tablet by mouth 2 (two) times daily. (Patient not taking: Reported on 08/09/2023) 60 tablet 0 Not Taking   Scheduled:   [START ON 08/12/2023] aspirin   81 mg Oral Pre-Cath   aspirin  EC  81 mg Oral Daily   atorvastatin   40 mg Oral Daily   clopidogrel   75 mg Oral Q breakfast   empagliflozin   10 mg Oral Daily   gabapentin   300 mg Oral QHS   insulin  aspart  0-15 Units Subcutaneous TID WC   insulin  aspart  0-5 Units Subcutaneous QHS   melatonin  3 mg Oral QHS   metoprolol  succinate  50 mg Oral Daily   pantoprazole   20 mg Oral Daily   Infusions:   [START ON 08/12/2023] sodium chloride      Followed by   Cecily Cohen ON 08/12/2023] sodium chloride      heparin  900 Units/hr (08/11/23 0946)   nitroGLYCERIN  Stopped (08/09/23 1640)   PRN: acetaminophen  **OR** acetaminophen , alum & mag hydroxide-simeth, ondansetron  **OR** ondansetron  (ZOFRAN ) IV  Assessment: 78yo female c/o CP radiating to shoulders and associated w/ N/V/SOB. Heparin  per pharmacy consult placed for chest pain/ACS. LHC planned for 08/12/23.Patient was not on anticoagulation prior to arrival.   Heparin  level this AM is 0.34 which is on the lower end of therapeutic but has remained therapeutic x 4 on 900 units/hr. Rate was never increased overnight. Hgb 11.5 stable, plt 143 stable. No signs of bleeding noted.   Goal of Therapy:  Heparin  level 0.3-0.7 units/ml Monitor platelets by anticoagulation protocol: Yes   Plan:  Continue heparin  900 units/hr  Daily heparin  level while on heparin  Continue to monitor H&H and platelets F/u plans post cath 5/27  Thank you for involving pharmacy in the patient's care.   Jerrel Mor, PharmD PGY1 Pharmacy Resident 08/11/2023 9:55  AM

## 2023-08-11 NOTE — Progress Notes (Signed)
 Mobility Specialist Progress Note;   08/11/23 0909  Mobility  Activity Ambulated with assistance in hallway  Level of Assistance Standby assist, set-up cues, supervision of patient - no hands on  Assistive Device Other (Comment) (IV pole)  Distance Ambulated (ft) 400 ft  Activity Response Tolerated well  Mobility Referral Yes  Mobility visit 1 Mobility  Mobility Specialist Start Time (ACUTE ONLY) 0909  Mobility Specialist Stop Time (ACUTE ONLY) 0916  Mobility Specialist Time Calculation (min) (ACUTE ONLY) 7 min   Pt eager for mobility. Required no physical assistance during ambulation, SV. HR up to 100 bpm w/ activity. No c/o when asked. Pt returned back to bed with all needs met. Granddaughter in room.   Janit Meline Mobility Specialist Please contact via SecureChat or [employer] [phone]

## 2023-08-11 NOTE — Progress Notes (Signed)
 PROGRESS NOTE    Anna Klein  ZOX:[id number] DOB: 1946-02-13 DOA: 08/08/2023 PCP: Bentley Bray, PA    Brief Narrative:  This 78 years old female with PMH significant for nonobstructive CAD, NICM, CHF, hyperlipidemia, left bundle branch block, GB syndrome, type 2 diabetes, hypertension, CKD stage IIIb, GERD presented in the ED with chest pain that started yesterday. Pain radiating towards left shoulder and neck and back.  Chest pain started while she was at rest,  describes chest pain as 10/10 on the pain scale associated with some shortness of breath. She has taken some nitro which resolved some pain.  Patient was recently admitted in March with NSTEMI status post left catheterization and she was recommended medical management with DAPT and statin and Toprol .  Patient was admitted for further evaluation. Cardiology was consulted.  Patient is scheduled to have left heart cath on 08/12/2023.  Assessment & Plan:   Principal Problem:   Chest pain  Chest pain: Elevated troponin: Patient with history of nonobstructive CAD , status post left heart cath on 06/02/2023. Continue IV heparin  for now,  Cardiology is consulted.  EKG > No significant changes. Patient continued on nitro gtt., Continue aspirin ,  Plavix ,  statin and metoprolol . Patient is already scheduled to have left heart cath on 08/12/2023.  NICM HFrEF: Echo showed ejection fraction of 45 to 50%, indeterminate diastolic parameters.   Patient appears euvolemic on exam.  BNP 616 Continue statin, Toprol , Jardiance .  Hold on Entresto  at this time.   CKD stage IIIb: Serum creatinine at baseline.   Continue to monitor serum creatinine.   Type 2 diabetes: Hb A1c 5.5, Continue sliding scale insulin    Hypertension:  Continue home medications-Toprol .   Hyperlipidemia:  Continue Lipitor  40 mg daily.   GERD:  Continue Protonix  40 mg daily.   History of Guillain-Barr syndrome: Stable.  Hypokalemia: Replaced.  Continue to  monitor.  DVT prophylaxis: Heparin  Code Status: Full code Family Communication: Granddaughter at bedside Disposition Plan:    Status is: Inpatient Remains inpatient appropriate because: Admitted for chest pain,  Patient is scheduled for left heart cath on 5/27.    Consultants:  Cardiology  Procedures:  Antimicrobials:  Anti-infectives (From admission, onward)    None       Subjective: Patient was seen and examined at bedside.  Overnight events noted. Granddaughter is at bedside.  Patient reports she has no pain,  she feels better and  improved, She denies any chest pain.   Objective: Vitals:   08/10/23 2334 08/11/23 0537 08/11/23 0823 08/11/23 1127  BP: 120/85 (!) 106/53 130/70 (!) 131/55  Pulse:  60 71   Resp: 16 18 16 18   Temp: 98.4 F (36.9 C) 97.6 F (36.4 C) (!) 97.4 F (36.3 C) 97.6 F (36.4 C)  TempSrc: Oral Oral Oral Oral  SpO2:  99% 97%   Weight:      Height:        Intake/Output Summary (Last 24 hours) at 08/11/2023 1159 Last data filed at 08/10/2023 2215 Gross per 24 hour  Intake 240 ml  Output --  Net 240 ml   Filed Weights   08/08/23 2123 08/09/23 1400  Weight: 72.6 kg 72.6 kg    Examination:  General exam: Appears calm and comfortable, not in any acute distress. Respiratory system: CTA Bilaterally.  Respiratory effort normal.  RR 16 Cardiovascular system: S1 & S2 heard, RRR. No JVD, murmurs, rubs, gallops or clicks.  Gastrointestinal system: Abdomen is non distended, soft and non tender.  Normal bowel sounds heard. Central nervous system: Alert and oriented X 3. No focal neurological deficits. Extremities: No edema, no cyanosis, no clubbing. Skin: No rashes, lesions or ulcers Psychiatry: Judgement and insight appear normal. Mood & affect appropriate.     Data Reviewed: I have personally reviewed following labs and imaging studies  CBC: Recent Labs  Lab 08/08/23 2144 08/10/23 0222 08/11/23 0439  WBC 8.6 8.9 5.8  HGB 14.5 11.5*  11.5*  HCT 45.1 36.4 36.7  MCV 89.7 90.8 91.8  PLT 164 149* 143*   Basic Metabolic Panel: Recent Labs  Lab 08/08/23 2144 08/10/23 0222  NA 142 138  K 4.0 3.3*  CL 103 105  CO2 22 25  GLUCOSE 188* 106*  BUN 17 20  CREATININE 1.50* 1.35*  CALCIUM  9.6 8.6*   GFR: Estimated Creatinine Clearance: 30 mL/min (A) (by C-G formula based on SCr of 1.35 mg/dL (H)). Liver Function Tests: Recent Labs  Lab 08/08/23 2144  AST 33  ALT 16  ALKPHOS 102  BILITOT 1.6*  PROT 6.8  ALBUMIN 4.3   Recent Labs  Lab 08/08/23 2144  LIPASE 45   No results for input(s): "AMMONIA" in the last 168 hours. Coagulation Profile: No results for input(s): "INR", "PROTIME" in the last 168 hours. Cardiac Enzymes: No results for input(s): "CKTOTAL", "CKMB", "CKMBINDEX", "TROPONINI" in the last 168 hours. BNP (last 3 results) No results for input(s): "PROBNP" in the last 8760 hours. HbA1C: Recent Labs    08/10/23 0222  HGBA1C 5.5   CBG: Recent Labs  Lab 08/10/23 1119 08/10/23 1635 08/10/23 2055 08/11/23 0720 08/11/23 1125  GLUCAP 96 100* 116* 91 124*   Lipid Profile: No results for input(s): "CHOL", "HDL", "LDLCALC", "TRIG", "CHOLHDL", "LDLDIRECT" in the last 72 hours. Thyroid Function Tests: No results for input(s): "TSH", "T4TOTAL", "FREET4", "T3FREE", "THYROIDAB" in the last 72 hours. Anemia Panel: No results for input(s): "VITAMINB12", "FOLATE", "FERRITIN", "TIBC", "IRON", "RETICCTPCT" in the last 72 hours. Sepsis Labs: No results for input(s): "PROCALCITON", "LATICACIDVEN" in the last 168 hours.  No results found for this or any previous visit (from the past 240 hours).   Radiology Studies: No results found.  Scheduled Meds:  [START ON 08/12/2023] aspirin   81 mg Oral Pre-Cath   aspirin  EC  81 mg Oral Daily   atorvastatin   40 mg Oral Daily   clopidogrel   75 mg Oral Q breakfast   empagliflozin   10 mg Oral Daily   gabapentin   300 mg Oral QHS   insulin  aspart  0-15 Units  Subcutaneous TID WC   insulin  aspart  0-5 Units Subcutaneous QHS   melatonin  3 mg Oral QHS   metoprolol  succinate  50 mg Oral Daily   pantoprazole   20 mg Oral Daily   Continuous Infusions:  [START ON 08/12/2023] sodium chloride      Followed by   Cecily Cohen ON 08/12/2023] sodium chloride      heparin  900 Units/hr (08/11/23 0946)   nitroGLYCERIN  Stopped (08/09/23 1640)     LOS: 2 days    Time spent: 35 mins    Magdalene School, MD [facility]   If 7PM-7AM, please contact night-coverage

## 2023-08-11 NOTE — Progress Notes (Signed)
*  PRELIMINARY RESULTS* Echocardiogram 2D Echocardiogram has been performed.  Anna Klein 08/11/2023, 2:45 PM

## 2023-08-11 NOTE — Progress Notes (Signed)
 Rounding Note    Patient Name: Anna Klein Date of Encounter: 08/11/2023  [facility] Cardiologist: Dr Lorie Rook  Subjective   Pt denies CP or dyspnea  Inpatient Medications    Scheduled Meds:  aspirin  EC  81 mg Oral Daily   atorvastatin   40 mg Oral Daily   clopidogrel   75 mg Oral Q breakfast   empagliflozin   10 mg Oral Daily   gabapentin   300 mg Oral QHS   insulin  aspart  0-15 Units Subcutaneous TID WC   insulin  aspart  0-5 Units Subcutaneous QHS   melatonin  3 mg Oral QHS   metoprolol  succinate  50 mg Oral Daily   pantoprazole   20 mg Oral Daily   Continuous Infusions:  heparin  900 Units/hr (08/10/23 0546)   nitroGLYCERIN  Stopped (08/09/23 1640)   PRN Meds: acetaminophen  **OR** acetaminophen , alum & mag hydroxide-simeth, ondansetron  **OR** ondansetron  (ZOFRAN ) IV   Vital Signs    Vitals:   08/10/23 1637 08/10/23 2010 08/10/23 2334 08/11/23 0537  BP: 118/60 118/65 120/85 (!) 106/53  Pulse: 67 71  60  Resp: 18 18 16 18   Temp: 98.2 F (36.8 C) 98.2 F (36.8 C) 98.4 F (36.9 C) 97.6 F (36.4 C)  TempSrc: Oral Oral Oral Oral  SpO2: 99% 97%  99%  Weight:      Height:        Intake/Output Summary (Last 24 hours) at 08/11/2023 0647 Last data filed at 08/10/2023 2215 Gross per 24 hour  Intake 420 ml  Output --  Net 420 ml      08/09/2023    2:00 PM 08/08/2023    9:23 PM 06/17/2023    8:46 AM  Last 3 Weights  Weight (lbs) 160 lb 1.6 oz 160 lb 162 lb 3.2 oz  Weight (kg) 72.621 kg 72.576 kg 73.573 kg      Telemetry    Sinus with PVCs- Personally Reviewed   Physical Exam   GEN: NAD Neck: supple Cardiac: RRR, no rub Respiratory: CTA GI: Soft, NT/ND MS: No edema Neuro:  Grossly intact Psych: Normal affect   Labs    High Sensitivity Troponin:   Recent Labs  Lab 08/08/23 2144 08/08/23 2353 08/09/23 0210 08/09/23 0340  TROPONINIHS 27* 49* 80* 89*     Chemistry Recent Labs  Lab 08/08/23 2144 08/10/23 0222  NA 142 138  K 4.0  3.3*  CL 103 105  CO2 22 25  GLUCOSE 188* 106*  BUN 17 20  CREATININE 1.50* 1.35*  CALCIUM  9.6 8.6*  PROT 6.8  --   ALBUMIN 4.3  --   AST 33  --   ALT 16  --   ALKPHOS 102  --   BILITOT 1.6*  --   GFRNONAA 35* 40*  ANIONGAP 17* 8     Hematology Recent Labs  Lab 08/08/23 2144 08/10/23 0222 08/11/23 0439  WBC 8.6 8.9 5.8  RBC 5.03 4.01 4.00  HGB 14.5 11.5* 11.5*  HCT 45.1 36.4 36.7  MCV 89.7 90.8 91.8  MCH 28.8 28.7 28.8  MCHC 32.2 31.6 31.3  RDW 14.2 14.6 14.4  PLT 164 149* 143*   BNP Recent Labs  Lab 08/09/23 1027  BNP 616.1*    DDimer  Recent Labs  Lab 08/09/23 0150  DDIMER 0.44      Patient Profile     78 year old female with past medical history of hypertension, hyperlipidemia, chronic stage IIIb kidney disease, gastroesophageal reflux disease, recent admission with non-ST elevation microinfarction for evaluation of  chest pain/recurrent non-ST elevation myocardial infarction.  Patient was admitted March 2025 with non-ST elevation myocardial infarction with peak troponin 2210.  Echocardiogram showed ejection fraction 45 to 50% and there appears to be hypokinesis of the anteroseptal wall and apex.  Cardiac catheterization March 2025 showed ejection fraction 45% with anterior and apical hypokinesis, moderate stenosis in the proximal LAD and 20% right coronary artery.  This was felt to be an unstable/ulcerated plaque in the proximal LAD but there was TIMI III flow and medical therapy recommended.  Now with recurrent CP.  Assessment & Plan    1 non-ST elevation myocardial infarction-No recurrent CP.  I did review her films with Dr. Addie Holstein previously.  She had a hazy area in the proximal LAD at time of catheterization in March which was treated medically.  However now with recurrent symptoms feel repeat catheterization is indicated to reassess.  Troponin minimally elevated. Continue aspirin , Plavix , heparin , nitroglycerin , Toprol  and statin.  Repeat echo pending.  Plan will be catheterization tomorrow.   2 hyperlipidemia-continue statin.   3 hypertension-BP controlled.  Continue present medications.   4 chronic stage IIIb kidney disease-hydrate prior to procedure and follow Cr after. Recheck bmet in AM.   For questions or updates, please contact [facility] Please consult [email or web address] for contact info under        Signed, Alexandria Angel, MD  08/11/2023, 6:47 AM

## 2023-08-12 ENCOUNTER — Encounter (HOSPITAL_COMMUNITY)
Admission: EM | Disposition: A | Discharge status: Home / Self Care | Payer: Self-pay | Source: Home / Self Care | Attending: Family Medicine

## 2023-08-12 DIAGNOSIS — I25110 Atherosclerotic heart disease of native coronary artery with unstable angina pectoris: Secondary | ICD-10-CM | POA: Diagnosis not present

## 2023-08-12 DIAGNOSIS — E876 Hypokalemia: Secondary | ICD-10-CM | POA: Diagnosis not present

## 2023-08-12 DIAGNOSIS — I214 Non-ST elevation (NSTEMI) myocardial infarction: Secondary | ICD-10-CM | POA: Diagnosis not present

## 2023-08-12 DIAGNOSIS — I200 Unstable angina: Secondary | ICD-10-CM | POA: Diagnosis not present

## 2023-08-12 DIAGNOSIS — N1832 Chronic kidney disease, stage 3b: Secondary | ICD-10-CM | POA: Diagnosis not present

## 2023-08-12 HISTORY — PX: CORONARY PRESSURE/FFR STUDY: CATH118243

## 2023-08-12 HISTORY — PX: LEFT HEART CATH AND CORONARY ANGIOGRAPHY: CATH118249

## 2023-08-12 LAB — BASIC METABOLIC PANEL WITH GFR
Anion gap: 10 (ref 5–15)
BUN: 16 mg/dL (ref 8–23)
CO2: 22 mmol/L (ref 22–32)
Calcium: 9.1 mg/dL (ref 8.9–10.3)
Chloride: 108 mmol/L (ref 98–111)
Creatinine, Ser: 1.11 mg/dL — ABNORMAL HIGH (ref 0.44–1.00)
GFR, Estimated: 51 mL/min — ABNORMAL LOW (ref >60)
Glucose, Bld: 92 mg/dL (ref 70–99)
Potassium: 3.4 mmol/L — ABNORMAL LOW (ref 3.5–5.1)
Sodium: 140 mmol/L (ref 135–145)

## 2023-08-12 LAB — GLUCOSE, CAPILLARY
Glucose-Capillary: 94 mg/dL (ref 70–99)
Glucose-Capillary: 75 mg/dL (ref 70–99)
Glucose-Capillary: 65 mg/dL — ABNORMAL LOW (ref 70–99)
Glucose-Capillary: 203 mg/dL — ABNORMAL HIGH (ref 70–99)
Glucose-Capillary: 184 mg/dL — ABNORMAL HIGH (ref 70–99)

## 2023-08-12 LAB — CBC
HCT: 36.5 % (ref 36.0–46.0)
Hemoglobin: 11.7 g/dL — ABNORMAL LOW (ref 12.0–15.0)
MCH: 29.5 pg (ref 26.0–34.0)
MCHC: 32.1 g/dL (ref 30.0–36.0)
MCV: 91.9 fL (ref 80.0–100.0)
Platelets: 139 10*3/uL — ABNORMAL LOW (ref 150–400)
RBC: 3.97 MIL/uL (ref 3.87–5.11)
RDW: 14.3 % (ref 11.5–15.5)
WBC: 4.6 10*3/uL (ref 4.0–10.5)
nRBC: 0 % (ref 0.0–0.2)

## 2023-08-12 LAB — POCT ACTIVATED CLOTTING TIME: Activated Clotting Time: 394 s

## 2023-08-12 LAB — HEPARIN LEVEL (UNFRACTIONATED): Heparin Unfractionated: 0.39 [IU]/mL (ref 0.30–0.70)

## 2023-08-12 SURGERY — LEFT HEART CATH AND CORONARY ANGIOGRAPHY
Anesthesia: LOCAL

## 2023-08-12 MED ORDER — HEPARIN (PORCINE) IN NACL 2-0.9 UNITS/ML
INTRAMUSCULAR | Status: DC | PRN
  Administered 2023-08-12: 5 mL via INTRA_ARTERIAL

## 2023-08-12 MED ORDER — HEPARIN (PORCINE) IN NACL 1000-0.9 UT/500ML-% IV SOLN
INTRAVENOUS | Status: DC | PRN
  Administered 2023-08-12 (×3): 500 mL

## 2023-08-12 MED ORDER — HYDRALAZINE HCL 20 MG/ML IJ SOLN: 10.0000 mg | INTRAMUSCULAR | Status: AC | PRN

## 2023-08-12 MED ORDER — FENTANYL CITRATE (PF) 100 MCG/2ML IJ SOLN
INTRAMUSCULAR | Status: AC
  Filled 2023-08-12: qty 2

## 2023-08-12 MED ORDER — HEPARIN SODIUM (PORCINE) 1000 UNIT/ML IJ SOLN
INTRAMUSCULAR | Status: DC | PRN
  Administered 2023-08-12: 3000 [IU] via INTRAVENOUS
  Administered 2023-08-12: 5000 [IU] via INTRAVENOUS

## 2023-08-12 MED ORDER — HEPARIN SODIUM (PORCINE) 1000 UNIT/ML IJ SOLN
INTRAMUSCULAR | Status: AC
Start: 2023-08-12 — End: ?
  Filled 2023-08-12: qty 10

## 2023-08-12 MED ORDER — IOHEXOL 350 MG/ML SOLN
INTRAVENOUS | Status: DC | PRN
  Administered 2023-08-12: 70 mL

## 2023-08-12 MED ORDER — ADENOSINE 12 MG/4ML IV SOLN
INTRAVENOUS | Status: AC
  Filled 2023-08-12: qty 16

## 2023-08-12 MED ORDER — VERAPAMIL HCL 2.5 MG/ML IV SOLN
INTRAVENOUS | Status: AC
  Filled 2023-08-12: qty 2

## 2023-08-12 MED ORDER — ADENOSINE (DIAGNOSTIC) 140MCG/KG/MIN
INTRAVENOUS | Status: AC | PRN
  Administered 2023-08-12: 140 ug/kg/min via INTRAVENOUS

## 2023-08-12 MED ORDER — SODIUM CHLORIDE 0.9% FLUSH
3.0000 mL | Freq: Two times a day (BID) | INTRAVENOUS | Status: DC
  Administered 2023-08-12 – 2023-08-13 (×2): 3 mL via INTRAVENOUS

## 2023-08-12 MED ORDER — LIDOCAINE HCL (PF) 1 % IJ SOLN
INTRAMUSCULAR | Status: DC | PRN
  Administered 2023-08-12: 2 mL via INTRADERMAL

## 2023-08-12 MED ORDER — MIDAZOLAM HCL 2 MG/2ML IJ SOLN
INTRAMUSCULAR | Status: AC
  Filled 2023-08-12: qty 2

## 2023-08-12 MED ORDER — FENTANYL CITRATE (PF) 100 MCG/2ML IJ SOLN
INTRAMUSCULAR | Status: DC | PRN
  Administered 2023-08-12: 25 ug via INTRAVENOUS

## 2023-08-12 MED ORDER — SODIUM CHLORIDE 0.9 % IV SOLN: INTRAVENOUS | Status: AC

## 2023-08-12 MED ORDER — POTASSIUM CHLORIDE CRYS ER 20 MEQ PO TBCR
40.0000 meq | EXTENDED_RELEASE_TABLET | Freq: Once | ORAL | Status: AC
  Administered 2023-08-12: 40 meq via ORAL
  Filled 2023-08-12: qty 2

## 2023-08-12 MED ORDER — ADENOSINE 6 MG/2ML IV SOLN
INTRAVENOUS | Status: AC
  Filled 2023-08-12: qty 8

## 2023-08-12 MED ORDER — MIDAZOLAM HCL 2 MG/2ML IJ SOLN
INTRAMUSCULAR | Status: DC | PRN
  Administered 2023-08-12: 1 mg via INTRAVENOUS

## 2023-08-12 MED ORDER — SODIUM CHLORIDE 0.9% FLUSH
3.0000 mL | INTRAVENOUS | Status: DC | PRN
Start: 2023-08-12 — End: 2023-08-13

## 2023-08-12 MED ORDER — POTASSIUM CHLORIDE 20 MEQ PO PACK: 40.0000 meq | PACK | Freq: Once | ORAL | Status: AC

## 2023-08-12 MED ORDER — PANTOPRAZOLE SODIUM 20 MG PO TBEC
20.0000 mg | DELAYED_RELEASE_TABLET | Freq: Once | ORAL | Status: AC
  Administered 2023-08-12: 20 mg via ORAL
  Filled 2023-08-12: qty 1

## 2023-08-12 MED ORDER — LABETALOL HCL 5 MG/ML IV SOLN: 10.0000 mg | INTRAVENOUS | Status: AC | PRN

## 2023-08-12 MED ORDER — SODIUM CHLORIDE 0.9 % IV SOLN: 250.0000 mL | INTRAVENOUS | Status: DC | PRN

## 2023-08-12 MED ORDER — LIDOCAINE HCL (PF) 1 % IJ SOLN
INTRAMUSCULAR | Status: AC
  Filled 2023-08-12: qty 30

## 2023-08-12 SURGICAL SUPPLY — 14 items
CATH INFINITI 5FR ANG PIGTAIL (CATHETERS) IMPLANT
CATH INFINITI AMBI 6FR TG (CATHETERS) IMPLANT
CATH LAUNCHER 6FR EBU3.5 (CATHETERS) IMPLANT
DEVICE RAD COMP TR BAND LRG (VASCULAR PRODUCTS) IMPLANT
GLIDESHEATH SLEND SS 6F .021 (SHEATH) IMPLANT
GUIDEWIRE PRESSURE X 175 (WIRE) IMPLANT
GUIDEWIRE TIGER .035X300 (WIRE) IMPLANT
KIT ESSENTIALS PG (KITS) IMPLANT
KIT HEMO VALVE WATCHDOG (MISCELLANEOUS) IMPLANT
KIT SYRINGE INJ CVI SPIKEX1 (MISCELLANEOUS) IMPLANT
PACK CARDIAC CATHETERIZATION (CUSTOM PROCEDURE TRAY) ×1 IMPLANT
SET ATX-X65L (MISCELLANEOUS) IMPLANT
SHEATH PROBE COVER 6X72 (BAG) IMPLANT
WIRE EMERALD 3MM-J .035X260CM (WIRE) IMPLANT

## 2023-08-12 NOTE — TOC Initial Note (Signed)
 Transition of Care [facility]) - Initial/Assessment Note    Patient Details  Name: Anna Klein MRN: [id number] Date of Birth: June 30, 1945  Transition of Care [facility]) CM/SW Contact:    Cosimo Diones, RN Phone Number: 08/12/2023, 11:56 AM  Clinical Narrative: Patient presented for chest pain. PTA patient states she lives alone independently and has support of daughters. Patient still drives to her appointments; however, no night driving. No home needs identified during the visit. Case Manager will continue to follow for additional transition of care needs as the patient progresses.                  Expected Discharge Plan: Home/Self Care Barriers to Discharge: No Barriers Identified   Patient Goals and CMS Choice Patient states their goals for this hospitalization and ongoing recovery are:: plan to return home once stable   Choice offered to / list presented to : NA   Expected Discharge Plan and Services In-house Referral: NA Discharge Planning Services: CM Consult Post Acute Care Choice: NA Living arrangements for the past 2 months: Single Family Home                   DME Agency: NA    Prior Living Arrangements/Services Living arrangements for the past 2 months: Single Family Home Lives with:: Self (has support of daughters.) Patient language and need for interpreter reviewed:: Yes Do you feel safe going back to the place where you live?: Yes      Need for Family Participation in Patient Care: No (Comment) Care giver support system in place?: Yes (comment)   Criminal Activity/Legal Involvement Pertinent to Current Situation/Hospitalization: No - Comment as needed  Activities of Daily Living   ADL Screening (condition at time of admission) Independently performs ADLs?: Yes (appropriate for developmental age) Is the patient deaf or have difficulty hearing?: No Does the patient have difficulty seeing, even when wearing glasses/contacts?: No Does the patient have  difficulty concentrating, remembering, or making decisions?: No  Permission Sought/Granted Permission sought to share information with : Family Supports, Case Manager                Emotional Assessment Appearance:: Appears stated age Attitude/Demeanor/Rapport: Engaged Affect (typically observed): Appropriate Orientation: : Oriented to Self, Oriented to Place, Oriented to  Time, Oriented to Situation Alcohol / Substance Use: Not Applicable Psych Involvement: No (comment)  Admission diagnosis:  Precordial pain [R07.2] Chest pain [R07.9] Patient Active Problem List   Diagnosis Date Noted   Chest pain 08/09/2023   HFrEF (heart failure with reduced ejection fraction) (HCC) 06/03/2023   Chronic heart failure with mildly reduced ejection fraction (HFmrEF, 41-49%) (HCC) 06/02/2023   CKD stage 3b, GFR 30-44 ml/min (HCC) 06/01/2023   NSTEMI (non-ST elevated myocardial infarction) (HCC) 05/31/2023   B12 deficiency    ESBL (extended spectrum beta-lactamase) producing bacteria infection    GBS (Guillain-Barre syndrome) (HCC)    Weakness    Acute kidney injury (HCC) 03/30/2014   Primary hypertension 03/30/2014   Facial droop 03/30/2014   Muscle weakness 03/29/2014   Fever 03/29/2014   DM type 2 (diabetes mellitus, type 2) (HCC) 03/28/2014   Chronic diastolic heart failure (HCC) 03/28/2014   Neuropathy 03/27/2014   Constipation 03/27/2014   Cholecystitis, acute 03/26/2014   Hypokalemia 03/26/2014   Decreased right shoulder range of motion 03/26/2014   Volume overload 03/26/2014   Abdominal pain 03/25/2014   Dehydration with hyponatremia 03/25/2014   Nausea 03/25/2014   PCP:  Bentley Bray,  PA Pharmacy:   [facility] [address] [place], [place] - [address] [place] [address] Phone: [phone] Fax: [phone]  [facility] [address] [place] [address] Phone: [phone] Fax: [phone]  [facility] [address], [place] - [address] [place] [address] Phone: [phone] Fax: [phone]     Social Drivers of Health (SDOH) Social History: SDOH Screenings   Food Insecurity: No Food Insecurity (08/10/2023)  Housing: Low Risk  (08/10/2023)  Transportation Needs: No Transportation Needs (08/10/2023)  Utilities: Not At Risk (08/10/2023)  Social Connections: Socially Isolated (08/10/2023)  Tobacco Use: Unknown (08/08/2023)   SDOH Interventions:     Readmission Risk Interventions     No data to display

## 2023-08-12 NOTE — Progress Notes (Signed)
 PROGRESS NOTE    Anna Klein  AOZ:[id number] DOB: 04-30-1945 DOA: 08/08/2023 PCP: Bentley Bray, PA    Brief Narrative:  This 78 years old female with PMH significant for nonobstructive CAD, NICM, CHF, hyperlipidemia, left bundle branch block, GB syndrome, type 2 diabetes, hypertension, CKD stage IIIb, GERD presented in the ED with chest pain that started yesterday. Pain radiating towards left shoulder and neck and back.  Chest pain started while she was at rest,  describes chest pain as 10/10 on the pain scale associated with some shortness of breath. She has taken some nitro which resolved some pain.  Patient was recently admitted in March with NSTEMI status post left catheterization and she was recommended medical management with DAPT and statin and Toprol .  Patient was admitted for further evaluation. Cardiology was consulted.  Patient is scheduled to have left heart cath on 08/12/2023.  Assessment & Plan:   Principal Problem:   Chest pain  Chest pain: Elevated troponin: Patient with history of nonobstructive CAD , status post left heart cath on 06/02/2023. She presented with chest pain , EKG > No significant changes. Continue IV heparin  for now,  Cardiology is consulted.   Patient continued on nitro gtt., Continue aspirin ,  Plavix ,  statin and metoprolol . Pain resolved,  nitro gtt. discontinued.  Patient is going to have a left heart cath today  NICM HFrEF: Echo showed ejection fraction of 45 to 50%, indeterminate diastolic parameters.   Patient appears euvolemic on exam.  BNP 616 Continue statin, Toprol , Jardiance .  Hold on Entresto  at this time.   CKD stage IIIb: Serum creatinine at baseline.   Continue to monitor serum creatinine.   Type 2 diabetes:  Hb A1c 5.5, Continue sliding scale insulin    Hypertension:  Continue home medications-Toprol .   Hyperlipidemia:  Continue Lipitor  40 mg daily.   GERD:  Continue Protonix  40 mg daily.   History of Guillain-Barr  syndrome: Stable.  Hypokalemia: Replaced.  Continue to monitor.  DVT prophylaxis: Heparin  Code Status: Full code Family Communication: Granddaughter at bedside Disposition Plan:    Status is: Inpatient Remains inpatient appropriate because: Admitted for chest pain,  Patient is scheduled for left heart cath on 5/27.    Consultants:  Cardiology  Procedures:  Antimicrobials:  Anti-infectives (From admission, onward)    None       Subjective: Patient was seen and examined at bedside. Overnight events noted. Patient reports she has no pain,  she feels better and  improved, She denies any chest pain.  Anticipating left heart cath today.   Objective: Vitals:   08/12/23 0018 08/12/23 0415 08/12/23 0802 08/12/23 1218  BP: (!) 116/52 (!) 135/57 (!) 126/56 (!) 142/57  Pulse: 61 69 64 68  Resp: 18 18 18 18   Temp: 98.1 F (36.7 C) 97.8 F (36.6 C) 97.8 F (36.6 C) 98 F (36.7 C)  TempSrc: Oral Oral Oral Oral  SpO2: 99% 99% 98% 97%  Weight:      Height:        Intake/Output Summary (Last 24 hours) at 08/12/2023 1243 Last data filed at 08/11/2023 2300 Gross per 24 hour  Intake --  Output 300 ml  Net -300 ml   Filed Weights   08/08/23 2123 08/09/23 1400  Weight: 72.6 kg 72.6 kg    Examination:  General exam: Appears calm and comfortable, not in any acute distress. Respiratory system: CTA Bilaterally.  Respiratory effort normal.  RR 16 Cardiovascular system: S1 & S2 heard, RRR. No JVD, murmurs,  rubs, gallops or clicks.  Gastrointestinal system: Abdomen is non distended, soft and non tender. Normal bowel sounds heard. Central nervous system: Alert and oriented X 3. No focal neurological deficits. Extremities: No edema, no cyanosis, no clubbing. Skin: No rashes, lesions or ulcers Psychiatry: Judgement and insight appear normal. Mood & affect appropriate.     Data Reviewed: I have personally reviewed following labs and imaging studies  CBC: Recent Labs  Lab  08/08/23 2144 08/10/23 0222 08/11/23 0439 08/12/23 0432  WBC 8.6 8.9 5.8 4.6  HGB 14.5 11.5* 11.5* 11.7*  HCT 45.1 36.4 36.7 36.5  MCV 89.7 90.8 91.8 91.9  PLT 164 149* 143* 139*   Basic Metabolic Panel: Recent Labs  Lab 08/08/23 2144 08/10/23 0222 08/12/23 0432  NA 142 138 140  K 4.0 3.3* 3.4*  CL 103 105 108  CO2 22 25 22   GLUCOSE 188* 106* 92  BUN 17 20 16   CREATININE 1.50* 1.35* 1.11*  CALCIUM  9.6 8.6* 9.1   GFR: Estimated Creatinine Clearance: 36.5 mL/min (A) (by C-G formula based on SCr of 1.11 mg/dL (H)). Liver Function Tests: Recent Labs  Lab 08/08/23 2144  AST 33  ALT 16  ALKPHOS 102  BILITOT 1.6*  PROT 6.8  ALBUMIN 4.3   Recent Labs  Lab 08/08/23 2144  LIPASE 45   No results for input(s): "AMMONIA" in the last 168 hours. Coagulation Profile: No results for input(s): "INR", "PROTIME" in the last 168 hours. Cardiac Enzymes: No results for input(s): "CKTOTAL", "CKMB", "CKMBINDEX", "TROPONINI" in the last 168 hours. BNP (last 3 results) No results for input(s): "PROBNP" in the last 8760 hours. HbA1C: Recent Labs    08/10/23 0222  HGBA1C 5.5   CBG: Recent Labs  Lab 08/11/23 1125 08/11/23 1635 08/11/23 2045 08/12/23 0806 08/12/23 1220  GLUCAP 124* 74 111* 94 75   Lipid Profile: No results for input(s): "CHOL", "HDL", "LDLCALC", "TRIG", "CHOLHDL", "LDLDIRECT" in the last 72 hours. Thyroid Function Tests: No results for input(s): "TSH", "T4TOTAL", "FREET4", "T3FREE", "THYROIDAB" in the last 72 hours. Anemia Panel: No results for input(s): "VITAMINB12", "FOLATE", "FERRITIN", "TIBC", "IRON", "RETICCTPCT" in the last 72 hours. Sepsis Labs: No results for input(s): "PROCALCITON", "LATICACIDVEN" in the last 168 hours.  No results found for this or any previous visit (from the past 240 hours).   Radiology Studies: ECHOCARDIOGRAM COMPLETE Result Date: 08/11/2023    ECHOCARDIOGRAM REPORT   Patient Name:   Anna Klein Date of Exam: 08/11/2023  Medical Rec #:  [id number]    Height:       60.0 in Accession #:    [id number]   Weight:       160.1 lb Date of Birth:  May 06, 1945   BSA:          1.698 m Patient Age:    13 years     BP:           130/70 mmHg Patient Gender: F            HR:           71 bpm. Exam Location:  Inpatient Procedure: 2D Echo, Cardiac Doppler and Color Doppler (Both Spectral and Color            Flow Doppler were utilized during procedure). Indications:    NSTEMI l21.4  History:        Patient has prior history of Echocardiogram examinations, most                 recent  06/01/2023. Chronic diastolic heart failure (HCC), Stroke;                 Risk Factors:Diabetes and Hypertension.  Sonographer:    Denese Finn RCS Referring Phys: [phone] BRIAN S CRENSHAW IMPRESSIONS  1. Left ventricular ejection fraction, by estimation, is 55%. The left ventricle has normal function. The left ventricle has no regional wall motion abnormalities. Left ventricular diastolic parameters are consistent with Grade III diastolic dysfunction  (restrictive).  2. Right ventricular systolic function is normal. The right ventricular size is normal. There is normal pulmonary artery systolic pressure. The estimated right ventricular systolic pressure is 30.5 mmHg.  3. Left atrial size was moderately dilated.  4. The mitral valve is normal in structure. Trivial mitral valve regurgitation. No evidence of mitral stenosis.  5. The aortic valve is tricuspid. There is moderate calcification of the aortic valve. Aortic valve regurgitation is not visualized. Aortic valve sclerosis/calcification is present, without any evidence of aortic stenosis. Aortic valve mean gradient measures 8.0 mmHg.  6. The inferior vena cava is normal in size with greater than 50% respiratory variability, suggesting right atrial pressure of 3 mmHg. FINDINGS  Left Ventricle: Left ventricular ejection fraction, by estimation, is 55%. The left ventricle has normal function. The left ventricle has no  regional wall motion abnormalities. The left ventricular internal cavity size was normal in size. There is no left ventricular hypertrophy. Left ventricular diastolic parameters are consistent with Grade III diastolic dysfunction (restrictive). Right Ventricle: The right ventricular size is normal. No increase in right ventricular wall thickness. Right ventricular systolic function is normal. There is normal pulmonary artery systolic pressure. The tricuspid regurgitant velocity is 2.62 m/s, and  with an assumed right atrial pressure of 3 mmHg, the estimated right ventricular systolic pressure is 30.5 mmHg. Left Atrium: Left atrial size was moderately dilated. Right Atrium: Right atrial size was normal in size. Pericardium: There is no evidence of pericardial effusion. Mitral Valve: The mitral valve is normal in structure. Trivial mitral valve regurgitation. No evidence of mitral valve stenosis. Tricuspid Valve: The tricuspid valve is normal in structure. Tricuspid valve regurgitation is trivial. Aortic Valve: The aortic valve is tricuspid. There is moderate calcification of the aortic valve. Aortic valve regurgitation is not visualized. Aortic valve sclerosis/calcification is present, without any evidence of aortic stenosis. Aortic valve mean gradient measures 8.0 mmHg. Aortic valve peak gradient measures 13.9 mmHg. Aortic valve area, by VTI measures 1.20 cm. Pulmonic Valve: The pulmonic valve was normal in structure. Pulmonic valve regurgitation is trivial. Aorta: The aortic root is normal in size and structure. Venous: The inferior vena cava is normal in size with greater than 50% respiratory variability, suggesting right atrial pressure of 3 mmHg. IAS/Shunts: No atrial level shunt detected by color flow Doppler.  LEFT VENTRICLE PLAX 2D LVIDd:         4.60 cm   Diastology LVIDs:         2.90 cm   LV e' medial:    5.33 cm/s LV PW:         1.00 cm   LV E/e' medial:  22.9 LV IVS:        0.90 cm   LV e' lateral:    8.92 cm/s LVOT diam:     1.90 cm   LV E/e' lateral: 13.7 LV SV:         58 LV SV Index:   34 LVOT Area:     2.84 cm  RIGHT VENTRICLE RV  S prime:     16.20 cm/s TAPSE (M-mode): 1.8 cm LEFT ATRIUM             Index        RIGHT ATRIUM           Index LA diam:        3.60 cm 2.12 cm/m   RA Area:     12.20 cm LA Vol (A2C):   39.1 ml 23.02 ml/m  RA Volume:   23.50 ml  13.84 ml/m LA Vol (A4C):   89.8 ml 52.88 ml/m LA Biplane Vol: 62.2 ml 36.63 ml/m  AORTIC VALVE AV Area (Vmax):    1.33 cm AV Area (Vmean):   1.37 cm AV Area (VTI):     1.20 cm AV Vmax:           186.50 cm/s AV Vmean:          134.000 cm/s AV VTI:            0.484 m AV Peak Grad:      13.9 mmHg AV Mean Grad:      8.0 mmHg LVOT Vmax:         87.80 cm/s LVOT Vmean:        64.967 cm/s LVOT VTI:          0.204 m LVOT/AV VTI ratio: 0.42  AORTA Ao Root diam: 2.80 cm MITRAL VALVE                TRICUSPID VALVE MV Area (PHT): 5.13 cm     TR Peak grad:   27.5 mmHg MV Decel Time: 148 msec     TR Vmax:        262.00 cm/s MV E velocity: 122.00 cm/s MV A velocity: 78.10 cm/s   SHUNTS MV E/A ratio:  1.56         Systemic VTI:  0.20 m                             Systemic Diam: 1.90 cm Dalton McleanMD Electronically signed by Archer Bear Signature Date/Time: 08/11/2023/2:53:36 PM    Final     Scheduled Meds:  aspirin  EC  81 mg Oral Daily   atorvastatin   40 mg Oral Daily   clopidogrel   75 mg Oral Q breakfast   empagliflozin   10 mg Oral Daily   gabapentin   300 mg Oral QHS   insulin  aspart  0-15 Units Subcutaneous TID WC   insulin  aspart  0-5 Units Subcutaneous QHS   melatonin  3 mg Oral QHS   metoprolol  succinate  50 mg Oral Daily   pantoprazole   20 mg Oral Daily   Continuous Infusions:  sodium chloride  1 mL/kg/hr (08/12/23 0549)   heparin  900 Units/hr (08/11/23 0946)   nitroGLYCERIN  Stopped (08/09/23 1640)     LOS: 3 days    Time spent: 35 mins    Magdalene School, MD [facility]   If 7PM-7AM, please contact night-coverage

## 2023-08-12 NOTE — Interval H&P Note (Signed)
 History and Physical Interval Note:  08/12/2023 12:21 PM  Anna Klein  has presented today for surgery, with the diagnosis of unstable angina.  The various methods of treatment have been discussed with the patient and family. After consideration of risks, benefits and other options for treatment, the patient has consented to  Procedure(s): LEFT HEART CATH AND CORONARY ANGIOGRAPHY (N/A) as a surgical intervention.  The patient's history has been reviewed, patient examined, no change in status, stable for surgery.  I have reviewed the patient's chart and labs.  Questions were answered to the patient's satisfaction.     Hanh Kertesz K Simon Llamas

## 2023-08-12 NOTE — Progress Notes (Signed)
 ANTICOAGULATION CONSULT NOTE  Pharmacy Consult for Heparin  Indication: chest pain/ACS  Allergies  Allergen Reactions   Aspirin  Other (See Comments)    Due to kidney function; Patient is taking medication and is not allergic or sensitive to.    Nsaids Other (See Comments)    Cannot take due to kidney function.    Patient Measurements: Height: 5' (152.4 cm) Weight: 72.6 kg (160 lb 1.6 oz) IBW/kg (Calculated) : 45.5 Heparin  Dosing Weight: 63.6 kg  Vital Signs: Temp: 97.8 F (36.6 C) (05/27 0415) Temp Source: Oral (05/27 0415) BP: 135/57 (05/27 0415) Pulse Rate: 69 (05/27 0415)  Labs: Recent Labs    08/10/23 0222 08/11/23 0439 08/12/23 0432  HGB 11.5* 11.5* 11.7*  HCT 36.4 36.7 36.5  PLT 149* 143* 139*  HEPARINUNFRC 0.31 0.34 0.39  CREATININE 1.35*  --  1.11*    Estimated Creatinine Clearance: 36.5 mL/min (A) (by C-G formula based on SCr of 1.11 mg/dL (H)).   Medical History: Past Medical History:  Diagnosis Date   DM (diabetes mellitus) (HCC)    GBS (Guillain Barre syndrome) (HCC)    Hypertension    Renal disorder    Stroke (HCC)     Medications:  Medications Prior to Admission  Medication Sig Dispense Refill Last Dose/Taking   acetaminophen  (TYLENOL ) 325 MG tablet Take 650 mg by mouth every 6 (six) hours as needed for mild pain (pain score 1-3).   Past Week   ascorbic acid (VITAMIN C) 500 MG tablet Take 500 mg by mouth daily.   08/08/2023 Morning   aspirin  EC 81 MG tablet Take 1 tablet (81 mg total) by mouth daily. Swallow whole. (Patient taking differently: Take 81 mg by mouth at bedtime. Swallow whole.) 30 tablet 0 08/08/2023 Bedtime   atorvastatin  (LIPITOR ) 40 MG tablet Take 1 tablet (40 mg total) by mouth daily. (Patient taking differently: Take 40 mg by mouth at bedtime.) 30 tablet 0 08/08/2023 Bedtime   brimonidine (ALPHAGAN) 0.2 % ophthalmic solution Place 1 drop into both eyes 3 (three) times daily.   08/08/2023 Noon   clopidogrel  (PLAVIX ) 75 MG tablet  Take 1 tablet (75 mg total) by mouth daily with breakfast. 30 tablet 0 08/08/2023 Morning   cyanocobalamin  (,VITAMIN B-12,) 1000 MCG/ML injection Inject 1 mL (1,000 mcg total) into the muscle once a week. 1 mL 0 08/08/2023 Morning   dorzolamide -timolol  (COSOPT ) 2-0.5 % ophthalmic solution Place 1 drop into both eyes 2 (two) times daily.   08/08/2023 Morning   empagliflozin  (JARDIANCE ) 10 MG TABS tablet Take 1 tablet (10 mg total) by mouth daily. 30 tablet 0 08/08/2023 Morning   gabapentin  (NEURONTIN ) 300 MG capsule Take 300 mg by mouth at bedtime.   Past Week   Magnesium  250 MG TABS Take 500 mg by mouth at bedtime.   Past Week   melatonin 3 MG TABS tablet Take 3 mg by mouth at bedtime.   Past Week   metoprolol  succinate (TOPROL  XL) 50 MG 24 hr tablet Take 1 tablet (50 mg total) by mouth daily. Take with or immediately following a meal. 30 tablet 0 08/08/2023 Morning   Multiple Vitamins-Minerals (PRESERVISION AREDS) CAPS Take 1 capsule by mouth in the morning and at bedtime.   08/08/2023 Morning   nitroGLYCERIN  (NITROSTAT ) 0.4 MG SL tablet Place 1 tablet (0.4 mg total) under the tongue every 5 (five) minutes as needed for chest pain. 25 tablet 0 08/08/2023 Evening   pantoprazole  (PROTONIX ) 20 MG tablet Take 1 tablet (20 mg total) by mouth  daily. 30 tablet 4 08/08/2023 Morning   potassium chloride  SA (KLOR-CON  M) 20 MEQ tablet Take 20 mEq by mouth daily.   08/08/2023 Morning   ROCKLATAN 0.02-0.005 % SOLN Place 1 drop into both eyes at bedtime.   Past Week   torsemide  (DEMADEX ) 10 MG tablet Take 10 mg by mouth daily. Can take an additional tablet as needed for weight gain   08/08/2023 Morning   Vitamin A 3 MG (10000 UT) TABS Take 1 tablet by mouth daily.   08/08/2023 Morning   [Paused] hydrALAZINE  (APRESOLINE ) 25 MG tablet Take 1 tablet (25 mg total) by mouth every 8 (eight) hours. (Patient not taking: Reported on 08/09/2023) 60 tablet 0 Unknown   sacubitril -valsartan  (ENTRESTO ) 49-51 MG Take 1 tablet by mouth 2  (two) times daily. (Patient not taking: Reported on 08/09/2023) 60 tablet 0 Not Taking   Scheduled:   aspirin  EC  81 mg Oral Daily   atorvastatin   40 mg Oral Daily   clopidogrel   75 mg Oral Q breakfast   empagliflozin   10 mg Oral Daily   gabapentin   300 mg Oral QHS   insulin  aspart  0-15 Units Subcutaneous TID WC   insulin  aspart  0-5 Units Subcutaneous QHS   melatonin  3 mg Oral QHS   metoprolol  succinate  50 mg Oral Daily   pantoprazole   20 mg Oral Daily   Infusions:   sodium chloride  1 mL/kg/hr (08/12/23 0549)   heparin  900 Units/hr (08/11/23 0946)   nitroGLYCERIN  Stopped (08/09/23 1640)   PRN: acetaminophen  **OR** acetaminophen , alum & mag hydroxide-simeth, ondansetron  **OR** ondansetron  (ZOFRAN ) IV  Assessment: 78yo female c/o CP radiating to shoulders and associated w/ N/V/SOB. Heparin  per pharmacy consult placed for chest pain/ACS. LHC planned for 08/12/23. Patient was not on anticoagulation prior to arrival.   Heparin  level therapeutic at 0.39, CBC stable, cath planned later today.  Goal of Therapy:  Heparin  level 0.3-0.7 units/ml Monitor platelets by anticoagulation protocol: Yes   Plan:  Continue heparin  900 units/hr  Daily heparin  level while on heparin   Levin Reamer, PharmD, BCPS, [facility] Clinical Pharmacist [phone] Please check AMION for all [facility] Pharmacy numbers 08/12/2023

## 2023-08-12 NOTE — H&P (View-Only) (Signed)
 Rounding Note    Patient Name: Anna Klein Date of Encounter: 08/12/2023  [facility] Cardiologist: Dr Lorie Rook  Subjective   No CP or dyspnea  Inpatient Medications    Scheduled Meds:  aspirin  EC  81 mg Oral Daily   atorvastatin   40 mg Oral Daily   clopidogrel   75 mg Oral Q breakfast   empagliflozin   10 mg Oral Daily   gabapentin   300 mg Oral QHS   insulin  aspart  0-15 Units Subcutaneous TID WC   insulin  aspart  0-5 Units Subcutaneous QHS   melatonin  3 mg Oral QHS   metoprolol  succinate  50 mg Oral Daily   pantoprazole   20 mg Oral Daily   Continuous Infusions:  sodium chloride  1 mL/kg/hr (08/12/23 0549)   heparin  900 Units/hr (08/11/23 0946)   nitroGLYCERIN  Stopped (08/09/23 1640)   PRN Meds: acetaminophen  **OR** acetaminophen , alum & mag hydroxide-simeth, ondansetron  **OR** ondansetron  (ZOFRAN ) IV   Vital Signs    Vitals:   08/11/23 1501 08/11/23 2028 08/12/23 0018 08/12/23 0415  BP: (!) 144/83 112/62 (!) 116/52 (!) 135/57  Pulse: 68 64 61 69  Resp: 18 18 18 18   Temp: 97.7 F (36.5 C) 97.9 F (36.6 C) 98.1 F (36.7 C) 97.8 F (36.6 C)  TempSrc: Oral Oral Oral Oral  SpO2:  98% 99% 99%  Weight:      Height:        Intake/Output Summary (Last 24 hours) at 08/12/2023 0739 Last data filed at 08/11/2023 2300 Gross per 24 hour  Intake --  Output 300 ml  Net -300 ml      08/09/2023    2:00 PM 08/08/2023    9:23 PM 06/17/2023    8:46 AM  Last 3 Weights  Weight (lbs) 160 lb 1.6 oz 160 lb 162 lb 3.2 oz  Weight (kg) 72.621 kg 72.576 kg 73.573 kg      Telemetry    Sinus with PVCs- Personally Reviewed   Physical Exam   GEN: NAD, WD, WN Neck: supple, no JVD Cardiac: RRR, no gallop Respiratory: CTA; no wheeze GI: Soft, NT/ND, no masses MS: No edema Neuro:  no focal findings Psych: Normal affect   Labs    High Sensitivity Troponin:   Recent Labs  Lab 08/08/23 2144 08/08/23 2353 08/09/23 0210 08/09/23 0340  TROPONINIHS 27* 49*  80* 89*     Chemistry Recent Labs  Lab 08/08/23 2144 08/10/23 0222 08/12/23 0432  NA 142 138 140  K 4.0 3.3* 3.4*  CL 103 105 108  CO2 22 25 22   GLUCOSE 188* 106* 92  BUN 17 20 16   CREATININE 1.50* 1.35* 1.11*  CALCIUM  9.6 8.6* 9.1  PROT 6.8  --   --   ALBUMIN 4.3  --   --   AST 33  --   --   ALT 16  --   --   ALKPHOS 102  --   --   BILITOT 1.6*  --   --   GFRNONAA 35* 40* 51*  ANIONGAP 17* 8 10     Hematology Recent Labs  Lab 08/10/23 0222 08/11/23 0439 08/12/23 0432  WBC 8.9 5.8 4.6  RBC 4.01 4.00 3.97  HGB 11.5* 11.5* 11.7*  HCT 36.4 36.7 36.5  MCV 90.8 91.8 91.9  MCH 28.7 28.8 29.5  MCHC 31.6 31.3 32.1  RDW 14.6 14.4 14.3  PLT 149* 143* 139*   BNP Recent Labs  Lab 08/09/23 1027  BNP 616.1*  DDimer  Recent Labs  Lab 08/09/23 0150  DDIMER 0.44      Patient Profile     78 year old female with past medical history of hypertension, hyperlipidemia, chronic stage IIIb kidney disease, gastroesophageal reflux disease, recent admission with non-ST elevation microinfarction for evaluation of chest pain/recurrent non-ST elevation myocardial infarction.  Patient was admitted March 2025 with non-ST elevation myocardial infarction with peak troponin 2210.  Echocardiogram showed ejection fraction 45 to 50% and there appears to be hypokinesis of the anteroseptal wall and apex.  Cardiac catheterization March 2025 showed ejection fraction 45% with anterior and apical hypokinesis, moderate stenosis in the proximal LAD and 20% right coronary artery.  This was felt to be an unstable/ulcerated plaque in the proximal LAD but there was TIMI III flow and medical therapy recommended.  Now with recurrent CP.  Follow-up echocardiogram May 26 showed ejection fraction 55%, grade 3 diastolic dysfunction, moderate left atrial enlargement.  Assessment & Plan    1 non-ST elevation myocardial infarction-patient denies recurrent chest pain since admission.  I previously reviewed her  films from March with Dr. Addie Holstein.  She had a hazy area in the proximal LAD which was treated medically.  However now with recurrent symptoms feel repeat catheterization is indicated to reassess.  Troponin minimally elevated. Continue aspirin , Plavix , heparin , nitroglycerin , Toprol  and statin.  Follow-up echocardiogram showed preserved LV function.  Proceed with cardiac catheterization today.  2 hyperlipidemia-continue statin.   3 hypertension-BP controlled.  Continue present medications.   4 chronic stage IIIb kidney disease-creatinine 1.11 this morning.  5 hypokalemia-supplement.   For questions or updates, please contact [facility] Please consult [email or web address] for contact info under        Signed, Alexandria Angel, MD  08/12/2023, 7:39 AM

## 2023-08-12 NOTE — Progress Notes (Signed)
 Removed TR band from right radial. Site is a level 1 for bruising. Educated patient to leave in place for 24 hrs. Pt verbalized understanding

## 2023-08-12 NOTE — Progress Notes (Signed)
 Rounding Note    Patient Name: Anna Klein Date of Encounter: 08/12/2023  [facility] Cardiologist: Dr Lorie Rook  Subjective   No CP or dyspnea  Inpatient Medications    Scheduled Meds:  aspirin  EC  81 mg Oral Daily   atorvastatin   40 mg Oral Daily   clopidogrel   75 mg Oral Q breakfast   empagliflozin   10 mg Oral Daily   gabapentin   300 mg Oral QHS   insulin  aspart  0-15 Units Subcutaneous TID WC   insulin  aspart  0-5 Units Subcutaneous QHS   melatonin  3 mg Oral QHS   metoprolol  succinate  50 mg Oral Daily   pantoprazole   20 mg Oral Daily   Continuous Infusions:  sodium chloride  1 mL/kg/hr (08/12/23 0549)   heparin  900 Units/hr (08/11/23 0946)   nitroGLYCERIN  Stopped (08/09/23 1640)   PRN Meds: acetaminophen  **OR** acetaminophen , alum & mag hydroxide-simeth, ondansetron  **OR** ondansetron  (ZOFRAN ) IV   Vital Signs    Vitals:   08/11/23 1501 08/11/23 2028 08/12/23 0018 08/12/23 0415  BP: (!) 144/83 112/62 (!) 116/52 (!) 135/57  Pulse: 68 64 61 69  Resp: 18 18 18 18   Temp: 97.7 F (36.5 C) 97.9 F (36.6 C) 98.1 F (36.7 C) 97.8 F (36.6 C)  TempSrc: Oral Oral Oral Oral  SpO2:  98% 99% 99%  Weight:      Height:        Intake/Output Summary (Last 24 hours) at 08/12/2023 0739 Last data filed at 08/11/2023 2300 Gross per 24 hour  Intake --  Output 300 ml  Net -300 ml      08/09/2023    2:00 PM 08/08/2023    9:23 PM 06/17/2023    8:46 AM  Last 3 Weights  Weight (lbs) 160 lb 1.6 oz 160 lb 162 lb 3.2 oz  Weight (kg) 72.621 kg 72.576 kg 73.573 kg      Telemetry    Sinus with PVCs- Personally Reviewed   Physical Exam   GEN: NAD, WD, WN Neck: supple, no JVD Cardiac: RRR, no gallop Respiratory: CTA; no wheeze GI: Soft, NT/ND, no masses MS: No edema Neuro:  no focal findings Psych: Normal affect   Labs    High Sensitivity Troponin:   Recent Labs  Lab 08/08/23 2144 08/08/23 2353 08/09/23 0210 08/09/23 0340  TROPONINIHS 27* 49*  80* 89*     Chemistry Recent Labs  Lab 08/08/23 2144 08/10/23 0222 08/12/23 0432  NA 142 138 140  K 4.0 3.3* 3.4*  CL 103 105 108  CO2 22 25 22   GLUCOSE 188* 106* 92  BUN 17 20 16   CREATININE 1.50* 1.35* 1.11*  CALCIUM  9.6 8.6* 9.1  PROT 6.8  --   --   ALBUMIN 4.3  --   --   AST 33  --   --   ALT 16  --   --   ALKPHOS 102  --   --   BILITOT 1.6*  --   --   GFRNONAA 35* 40* 51*  ANIONGAP 17* 8 10     Hematology Recent Labs  Lab 08/10/23 0222 08/11/23 0439 08/12/23 0432  WBC 8.9 5.8 4.6  RBC 4.01 4.00 3.97  HGB 11.5* 11.5* 11.7*  HCT 36.4 36.7 36.5  MCV 90.8 91.8 91.9  MCH 28.7 28.8 29.5  MCHC 31.6 31.3 32.1  RDW 14.6 14.4 14.3  PLT 149* 143* 139*   BNP Recent Labs  Lab 08/09/23 1027  BNP 616.1*  DDimer  Recent Labs  Lab 08/09/23 0150  DDIMER 0.44      Patient Profile     78 year old female with past medical history of hypertension, hyperlipidemia, chronic stage IIIb kidney disease, gastroesophageal reflux disease, recent admission with non-ST elevation microinfarction for evaluation of chest pain/recurrent non-ST elevation myocardial infarction.  Patient was admitted March 2025 with non-ST elevation myocardial infarction with peak troponin 2210.  Echocardiogram showed ejection fraction 45 to 50% and there appears to be hypokinesis of the anteroseptal wall and apex.  Cardiac catheterization March 2025 showed ejection fraction 45% with anterior and apical hypokinesis, moderate stenosis in the proximal LAD and 20% right coronary artery.  This was felt to be an unstable/ulcerated plaque in the proximal LAD but there was TIMI III flow and medical therapy recommended.  Now with recurrent CP.  Follow-up echocardiogram May 26 showed ejection fraction 55%, grade 3 diastolic dysfunction, moderate left atrial enlargement.  Assessment & Plan    1 non-ST elevation myocardial infarction-patient denies recurrent chest pain since admission.  I previously reviewed her  films from March with Dr. Addie Holstein.  She had a hazy area in the proximal LAD which was treated medically.  However now with recurrent symptoms feel repeat catheterization is indicated to reassess.  Troponin minimally elevated. Continue aspirin , Plavix , heparin , nitroglycerin , Toprol  and statin.  Follow-up echocardiogram showed preserved LV function.  Proceed with cardiac catheterization today.  2 hyperlipidemia-continue statin.   3 hypertension-BP controlled.  Continue present medications.   4 chronic stage IIIb kidney disease-creatinine 1.11 this morning.  5 hypokalemia-supplement.   For questions or updates, please contact [facility] Please consult [email or web address] for contact info under        Signed, Alexandria Angel, MD  08/12/2023, 7:39 AM

## 2023-08-13 ENCOUNTER — Other Ambulatory Visit (HOSPITAL_COMMUNITY): Payer: Self-pay

## 2023-08-13 ENCOUNTER — Encounter (HOSPITAL_COMMUNITY): Payer: Self-pay | Admitting: Internal Medicine

## 2023-08-13 ENCOUNTER — Other Ambulatory Visit: Payer: Self-pay | Admitting: Student

## 2023-08-13 DIAGNOSIS — N1832 Chronic kidney disease, stage 3b: Secondary | ICD-10-CM | POA: Diagnosis not present

## 2023-08-13 DIAGNOSIS — I200 Unstable angina: Secondary | ICD-10-CM | POA: Diagnosis not present

## 2023-08-13 DIAGNOSIS — I10 Essential (primary) hypertension: Secondary | ICD-10-CM | POA: Diagnosis not present

## 2023-08-13 DIAGNOSIS — I214 Non-ST elevation (NSTEMI) myocardial infarction: Secondary | ICD-10-CM | POA: Diagnosis not present

## 2023-08-13 LAB — BASIC METABOLIC PANEL WITH GFR
Anion gap: 9 (ref 5–15)
BUN: 10 mg/dL (ref 8–23)
CO2: 20 mmol/L — ABNORMAL LOW (ref 22–32)
Calcium: 8.7 mg/dL — ABNORMAL LOW (ref 8.9–10.3)
Chloride: 111 mmol/L (ref 98–111)
Creatinine, Ser: 1.03 mg/dL — ABNORMAL HIGH (ref 0.44–1.00)
GFR, Estimated: 55 mL/min — ABNORMAL LOW (ref >60)
Glucose, Bld: 90 mg/dL (ref 70–99)
Potassium: 3.4 mmol/L — ABNORMAL LOW (ref 3.5–5.1)
Sodium: 140 mmol/L (ref 135–145)

## 2023-08-13 LAB — HEPARIN LEVEL (UNFRACTIONATED): Heparin Unfractionated: <0.1 [IU]/mL — ABNORMAL LOW (ref 0.30–0.70)

## 2023-08-13 LAB — CBC
HCT: 38.3 % (ref 36.0–46.0)
Hemoglobin: 12.4 g/dL (ref 12.0–15.0)
MCH: 29.3 pg (ref 26.0–34.0)
MCHC: 32.4 g/dL (ref 30.0–36.0)
MCV: 90.5 fL (ref 80.0–100.0)
Platelets: 157 10*3/uL (ref 150–400)
RBC: 4.23 MIL/uL (ref 3.87–5.11)
RDW: 14.6 % (ref 11.5–15.5)
WBC: 5.2 10*3/uL (ref 4.0–10.5)
nRBC: 0 % (ref 0.0–0.2)

## 2023-08-13 LAB — GLUCOSE, CAPILLARY: Glucose-Capillary: 85 mg/dL (ref 70–99)

## 2023-08-13 MED ORDER — POTASSIUM CHLORIDE CRYS ER 20 MEQ PO TBCR
40.0000 meq | EXTENDED_RELEASE_TABLET | Freq: Once | ORAL | Status: AC
  Administered 2023-08-13: 40 meq via ORAL
  Filled 2023-08-13: qty 2

## 2023-08-13 MED ORDER — TORSEMIDE 10 MG PO TABS
10.0000 mg | ORAL_TABLET | ORAL | 0 refills | Status: AC
  Filled 2023-08-13: qty 30, 60d supply, fill #0

## 2023-08-13 MED ORDER — ENTRESTO 24-26 MG PO TABS
1.0000 | ORAL_TABLET | Freq: Two times a day (BID) | ORAL | 0 refills | Status: AC
  Filled 2023-08-13: qty 120, 60d supply, fill #0

## 2023-08-13 NOTE — Plan of Care (Signed)
  Problem: Education: Goal: Ability to describe self-care measures that may prevent or decrease complications (Diabetes Survival Skills Education) will improve Outcome: Adequate for Discharge Goal: Individualized Educational Video(s) Outcome: Adequate for Discharge   Problem: Coping: Goal: Ability to adjust to condition or change in health will improve Outcome: Adequate for Discharge   Problem: Fluid Volume: Goal: Ability to maintain a balanced intake and output will improve Outcome: Adequate for Discharge   Problem: Health Behavior/Discharge Planning: Goal: Ability to identify and utilize available resources and services will improve Outcome: Adequate for Discharge Goal: Ability to manage health-related needs will improve Outcome: Adequate for Discharge   Problem: Metabolic: Goal: Ability to maintain appropriate glucose levels will improve Outcome: Adequate for Discharge   Problem: Nutritional: Goal: Maintenance of adequate nutrition will improve Outcome: Adequate for Discharge Goal: Progress toward achieving an optimal weight will improve Outcome: Adequate for Discharge   Problem: Skin Integrity: Goal: Risk for impaired skin integrity will decrease Outcome: Adequate for Discharge   Problem: Tissue Perfusion: Goal: Adequacy of tissue perfusion will improve Outcome: Adequate for Discharge   Problem: Education: Goal: Knowledge of General Education information will improve Description: Including pain rating scale, medication(s)/side effects and non-pharmacologic comfort measures Outcome: Adequate for Discharge   Problem: Health Behavior/Discharge Planning: Goal: Ability to manage health-related needs will improve Outcome: Adequate for Discharge   Problem: Clinical Measurements: Goal: Ability to maintain clinical measurements within normal limits will improve Outcome: Adequate for Discharge Goal: Will remain free from infection Outcome: Adequate for Discharge Goal:  Diagnostic test results will improve Outcome: Adequate for Discharge Goal: Respiratory complications will improve Outcome: Adequate for Discharge Goal: Cardiovascular complication will be avoided Outcome: Adequate for Discharge   Problem: Activity: Goal: Risk for activity intolerance will decrease Outcome: Adequate for Discharge   Problem: Nutrition: Goal: Adequate nutrition will be maintained Outcome: Adequate for Discharge   Problem: Coping: Goal: Level of anxiety will decrease Outcome: Adequate for Discharge   Problem: Elimination: Goal: Will not experience complications related to bowel motility Outcome: Adequate for Discharge Goal: Will not experience complications related to urinary retention Outcome: Adequate for Discharge   Problem: Pain Managment: Goal: General experience of comfort will improve and/or be controlled Outcome: Adequate for Discharge   Problem: Safety: Goal: Ability to remain free from injury will improve Outcome: Adequate for Discharge   Problem: Skin Integrity: Goal: Risk for impaired skin integrity will decrease Outcome: Adequate for Discharge   Problem: Education: Goal: Understanding of CV disease, CV risk reduction, and recovery process will improve Outcome: Adequate for Discharge Goal: Individualized Educational Video(s) Outcome: Adequate for Discharge   Problem: Activity: Goal: Ability to return to baseline activity level will improve Outcome: Adequate for Discharge   Problem: Cardiovascular: Goal: Ability to achieve and maintain adequate cardiovascular perfusion will improve Outcome: Adequate for Discharge Goal: Vascular access site(s) Level 0-1 will be maintained Outcome: Adequate for Discharge   Problem: Health Behavior/Discharge Planning: Goal: Ability to safely manage health-related needs after discharge will improve Outcome: Adequate for Discharge

## 2023-08-13 NOTE — Progress Notes (Signed)
 Ordered repeat BMET to be checked in a week at the request of Dr. Audery Blazing. Please see his rounding note from today for more information.   Anna Klein E Selmer Adduci, PA-C 08/13/2023 7:46 AM

## 2023-08-13 NOTE — Discharge Summary (Signed)
 Physician Discharge Summary   Patient: Anna Klein MRN: [id number] DOB: 1945/07/10  Admit date:     08/08/2023  Discharge date: 08/13/23  Discharge Physician: Ephriam Hashimoto   PCP: Bentley Bray, PA     Recommendations at discharge:  Follow up with Cardiology in 1 month Joyce Nixon or Charles Connor: Please check BMP in 1 week on new torsemide  and Entresto  doses and fax to Dr. Audery Blazing     Discharge Diagnoses: Principal Problem:   Chest pain Other hospital problems   Coronary artery disease   Hypertension   Chronic kidney disease stage IIIb   Diet-controlled diabetes     Obesity   Hyperlipidemia   Chronic systolic Congestive heart failure   Ischemic cardiomyopathy     Hospital Course: 78 y.o. F with HTN, CKD IIIb, recent NSTEMI complicated by ischemic cardiomyopathy, also DM who presented with chest pain.   Chest pain Hypertension Coronary artery disease The patient was admitted and underwent cardiac catheterization which showed no obstructive coronary disease.  Cardiology recommended medical therapy.  She was continued on aspirin , Plavix , statin, and Toprol .  Follow-up echocardiogram showed preserved EF.  She was evaluated by cardiology who recommended resumption of torsemide  at every other day dosing, and Entresto  at 24/26 dosing.  Close follow-up BMP as planned.  Chronic kidney disease stage IIIb Chronic systolic congestive heart failure Torsemide  dose was reduced to 10 mg every other day, Entresto  was initiated at low-dose.  Diet controlled diabetes Hemoglobin A1c less than 6%.  She is on Jardiance  for coronary disease and heart failure, and in the hospital, her blood sugars were overall very well-controlled.         The [place]  Controlled Substances Registry was reviewed for this patient prior to discharge.   Consultants: Cardiology Procedures performed: Left heart cath  Disposition: Home Diet recommendation:  Discharge Diet Orders  (From admission, onward)     Start     Ordered   08/13/23 0000  Diet - low sodium heart healthy        08/13/23 0851             DISCHARGE MEDICATION: Allergies as of 08/13/2023       Reactions   Aspirin  Other (See Comments)   Due to kidney function; Patient is taking medication and is not allergic or sensitive to.    Nsaids Other (See Comments)   Cannot take due to kidney function.        Medication List     STOP taking these medications    Entresto  49-51 MG Generic drug: sacubitril -valsartan  Replaced by: Entresto  24-26 MG   hydrALAZINE  25 MG tablet Commonly known as: APRESOLINE        TAKE these medications    acetaminophen  325 MG tablet Commonly known as: TYLENOL  Take 650 mg by mouth every 6 (six) hours as needed for mild pain (pain score 1-3).   ascorbic acid 500 MG tablet Commonly known as: VITAMIN C Take 500 mg by mouth daily.   aspirin  EC 81 MG tablet Take 1 tablet (81 mg total) by mouth daily. Swallow whole. What changed: when to take this   atorvastatin  40 MG tablet Commonly known as: LIPITOR  Take 1 tablet (40 mg total) by mouth daily. What changed: when to take this   brimonidine 0.2 % ophthalmic solution Commonly known as: ALPHAGAN Place 1 drop into both eyes 3 (three) times daily.   clopidogrel  75 MG tablet Commonly known as: PLAVIX  Take 1 tablet (75 mg total) by  mouth daily with breakfast.   cyanocobalamin  1000 MCG/ML injection Commonly known as: VITAMIN B12 Inject 1 mL (1,000 mcg total) into the muscle once a week.   dorzolamide -timolol  2-0.5 % ophthalmic solution Commonly known as: COSOPT  Place 1 drop into both eyes 2 (two) times daily.   Entresto  24-26 MG Generic drug: sacubitril -valsartan  Take 1 tablet by mouth 2 (two) times daily. Replaces: Entresto  49-51 MG   gabapentin  300 MG capsule Commonly known as: NEURONTIN  Take 300 mg by mouth at bedtime.   Jardiance  10 MG Tabs tablet Generic drug: empagliflozin  Take 1  tablet (10 mg total) by mouth daily.   Magnesium  250 MG Tabs Take 500 mg by mouth at bedtime.   melatonin 3 MG Tabs tablet Take 3 mg by mouth at bedtime.   metoprolol  succinate 50 MG 24 hr tablet Commonly known as: Toprol  XL Take 1 tablet (50 mg total) by mouth daily. Take with or immediately following a meal.   nitroGLYCERIN  0.4 MG SL tablet Commonly known as: Nitrostat  Place 1 tablet (0.4 mg total) under the tongue every 5 (five) minutes as needed for chest pain.   pantoprazole  20 MG tablet Commonly known as: Protonix  Take 1 tablet (20 mg total) by mouth daily.   potassium chloride  SA 20 MEQ tablet Commonly known as: KLOR-CON  M Take 20 mEq by mouth daily.   PreserVision AREDS Caps Take 1 capsule by mouth in the morning and at bedtime.   Rocklatan 0.02-0.005 % Soln Generic drug: Netarsudil-Latanoprost  Place 1 drop into both eyes at bedtime.   torsemide  10 MG tablet Commonly known as: DEMADEX  Take 1 tablet (10 mg total) by mouth every other day. Can take an additional tablet as needed for weight gain What changed: when to take this   Vitamin A 3 MG (10000 UT) Tabs Take 1 tablet by mouth daily.        Follow-up Information     Valentina Gasman Karon Packer., NP Follow up.   Specialty: Cardiology Why: Hospital follow-up with Cardiology scheduled for 09/23/2023 at 10:30pm. Please arrive 15 minutes early for check-in. If this date/ time does not work for you, please call our office to reschedule. Contact information: [address] [place] [address] [phone]         [facility] at [facility] of [facility] [employer] Follow up.   Specialty: Cardiology Why: Please come by our office in 1 week for repeat lab work. You do not have to have any appointment for this and you do not have to be fasting for these labs. Contact information: [address] [place]  [address] [phone]                Discharge Instructions     Diet -  low sodium heart healthy   Complete by: As directed    Discharge instructions   Complete by: As directed    **IMPORTANT DISCHARGE INSTRUCTIONS**   From Dr. Darlyn Eke:  You were admitted for chest discomfort  Here, you had a heart cath that showed no abnormal findings.  Everything appeared as it should (you have atherosclerosis there, but nothing new or dangerous)  REDUCE some of your medicines:   REDUCE Torsemide  to 10mg  every OTHER day   Dr. Audery Blazing wants you to START the new medicine Entresto , but start at a lower dose than previously recommended START Entresto  24-26mg  twice daily  CONTINUE Aspirin  and Plavix  to prevent platelets clumping together CONTINUE current dose of Lipitor /atorvastatin  for cholesterol  CONTINUE current dose of metoprolol /Toprol -XL for heart protection  With the new doses of torsemide  and Entresto , get labs checked in 1 week  You will need Charles Connor to refill your medications when you see him in July   Increase activity slowly   Complete by: As directed        Discharge Exam: Filed Weights   08/08/23 2123 08/09/23 1400  Weight: 72.6 kg 72.6 kg    General:  Pt is alert, awake, not in acute distress Cardiovascular: RRR, nl S1-S2, no murmurs appreciated.   No LE edema.   Respiratory: Normal respiratory rate and rhythm.  CTAB without rales or wheezes. Abdominal: Abdomen soft and non-tender.  No distension or HSM.   Neuro/Psych: Strength symmetric in upper and lower extremities.  Judgment and insight appear normal .   Condition at discharge: good  The results of significant diagnostics from this hospitalization (including imaging, microbiology, ancillary and laboratory) are listed below for reference.   Imaging Studies: CARDIAC CATHETERIZATION Result Date: 08/12/2023   Prox LAD to Mid LAD lesion is 30% stenosed.   Prox Cx to Mid Cx lesion is 10% stenosed.   Prox RCA lesion is 20% stenosed. 1.  Mild nonobstructive coronary artery disease. 2.  No  evidence of coronary microvascular dysfunction with CFR 2.3 with normal being greater than 2-2.2 and IMR of 9 with normal being less than 25. 3.  LVEDP of 17 mmHg. Recommendation: Medical therapy.   ECHOCARDIOGRAM COMPLETE Result Date: 08/11/2023    ECHOCARDIOGRAM REPORT   Patient Name:   Anna Klein Date of Exam: 08/11/2023 Medical Rec #:  [id number]    Height:       60.0 in Accession #:    [id number]   Weight:       160.1 lb Date of Birth:  08-30-1945   BSA:          1.698 m Patient Age:    30 years     BP:           130/70 mmHg Patient Gender: F            HR:           71 bpm. Exam Location:  Inpatient Procedure: 2D Echo, Cardiac Doppler and Color Doppler (Both Spectral and Color            Flow Doppler were utilized during procedure). Indications:    NSTEMI l21.4  History:        Patient has prior history of Echocardiogram examinations, most                 recent 06/01/2023. Chronic diastolic heart failure (HCC), Stroke;                 Risk Factors:Diabetes and Hypertension.  Sonographer:    Denese Finn RCS Referring Phys: [phone] BRIAN S CRENSHAW IMPRESSIONS  1. Left ventricular ejection fraction, by estimation, is 55%. The left ventricle has normal function. The left ventricle has no regional wall motion abnormalities. Left ventricular diastolic parameters are consistent with Grade III diastolic dysfunction  (restrictive).  2. Right ventricular systolic function is normal. The right ventricular size is normal. There is normal pulmonary artery systolic pressure. The estimated right ventricular systolic pressure is 30.5 mmHg.  3. Left atrial size was moderately dilated.  4. The mitral valve is normal in structure. Trivial mitral valve regurgitation. No evidence of mitral stenosis.  5. The aortic valve is tricuspid. There is moderate calcification of the aortic valve.  Aortic valve regurgitation is not visualized. Aortic valve sclerosis/calcification is present, without any evidence of aortic stenosis. Aortic  valve mean gradient measures 8.0 mmHg.  6. The inferior vena cava is normal in size with greater than 50% respiratory variability, suggesting right atrial pressure of 3 mmHg. FINDINGS  Left Ventricle: Left ventricular ejection fraction, by estimation, is 55%. The left ventricle has normal function. The left ventricle has no regional wall motion abnormalities. The left ventricular internal cavity size was normal in size. There is no left ventricular hypertrophy. Left ventricular diastolic parameters are consistent with Grade III diastolic dysfunction (restrictive). Right Ventricle: The right ventricular size is normal. No increase in right ventricular wall thickness. Right ventricular systolic function is normal. There is normal pulmonary artery systolic pressure. The tricuspid regurgitant velocity is 2.62 m/s, and  with an assumed right atrial pressure of 3 mmHg, the estimated right ventricular systolic pressure is 30.5 mmHg. Left Atrium: Left atrial size was moderately dilated. Right Atrium: Right atrial size was normal in size. Pericardium: There is no evidence of pericardial effusion. Mitral Valve: The mitral valve is normal in structure. Trivial mitral valve regurgitation. No evidence of mitral valve stenosis. Tricuspid Valve: The tricuspid valve is normal in structure. Tricuspid valve regurgitation is trivial. Aortic Valve: The aortic valve is tricuspid. There is moderate calcification of the aortic valve. Aortic valve regurgitation is not visualized. Aortic valve sclerosis/calcification is present, without any evidence of aortic stenosis. Aortic valve mean gradient measures 8.0 mmHg. Aortic valve peak gradient measures 13.9 mmHg. Aortic valve area, by VTI measures 1.20 cm. Pulmonic Valve: The pulmonic valve was normal in structure. Pulmonic valve regurgitation is trivial. Aorta: The aortic root is normal in size and structure. Venous: The inferior vena cava is normal in size with greater than 50% respiratory  variability, suggesting right atrial pressure of 3 mmHg. IAS/Shunts: No atrial level shunt detected by color flow Doppler.  LEFT VENTRICLE PLAX 2D LVIDd:         4.60 cm   Diastology LVIDs:         2.90 cm   LV e' medial:    5.33 cm/s LV PW:         1.00 cm   LV E/e' medial:  22.9 LV IVS:        0.90 cm   LV e' lateral:   8.92 cm/s LVOT diam:     1.90 cm   LV E/e' lateral: 13.7 LV SV:         58 LV SV Index:   34 LVOT Area:     2.84 cm  RIGHT VENTRICLE RV S prime:     16.20 cm/s TAPSE (M-mode): 1.8 cm LEFT ATRIUM             Index        RIGHT ATRIUM           Index LA diam:        3.60 cm 2.12 cm/m   RA Area:     12.20 cm LA Vol (A2C):   39.1 ml 23.02 ml/m  RA Volume:   23.50 ml  13.84 ml/m LA Vol (A4C):   89.8 ml 52.88 ml/m LA Biplane Vol: 62.2 ml 36.63 ml/m  AORTIC VALVE AV Area (Vmax):    1.33 cm AV Area (Vmean):   1.37 cm AV Area (VTI):     1.20 cm AV Vmax:           186.50 cm/s AV Vmean:  134.000 cm/s AV VTI:            0.484 m AV Peak Grad:      13.9 mmHg AV Mean Grad:      8.0 mmHg LVOT Vmax:         87.80 cm/s LVOT Vmean:        64.967 cm/s LVOT VTI:          0.204 m LVOT/AV VTI ratio: 0.42  AORTA Ao Root diam: 2.80 cm MITRAL VALVE                TRICUSPID VALVE MV Area (PHT): 5.13 cm     TR Peak grad:   27.5 mmHg MV Decel Time: 148 msec     TR Vmax:        262.00 cm/s MV E velocity: 122.00 cm/s MV A velocity: 78.10 cm/s   SHUNTS MV E/A ratio:  1.56         Systemic VTI:  0.20 m                             Systemic Diam: 1.90 cm Dalton McleanMD Electronically signed by Archer Bear Signature Date/Time: 08/11/2023/2:53:36 PM    Final    DG Chest Port 1 View Result Date: 08/08/2023 CLINICAL DATA:  Sternal chest pain radiating to bilateral shoulders EXAM: PORTABLE CHEST 1 VIEW COMPARISON:  05/31/2023 FINDINGS: Single frontal view of the chest demonstrates an enlarged cardiac silhouette. Stable large hiatal hernia. No acute airspace disease, effusion, or pneumothorax. No acute bony  abnormalities. IMPRESSION: 1. No acute intrathoracic process. 2. Large hiatal hernia. Electronically Signed   By: Bobbye Burrow M.D.   On: 08/08/2023 21:53    Microbiology: Results for orders placed or performed during the hospital encounter of 05/31/23  Resp panel by RT-PCR (RSV, Flu A&B, Covid) Anterior Nasal Swab     Status: None   Collection Time: 05/31/23  4:31 PM   Specimen: Anterior Nasal Swab  Result Value Ref Range Status   SARS Coronavirus 2 by RT PCR NEGATIVE NEGATIVE Final   Influenza A by PCR NEGATIVE NEGATIVE Final   Influenza B by PCR NEGATIVE NEGATIVE Final    Comment: (NOTE) The Xpert Xpress SARS-CoV-2/FLU/RSV plus assay is intended as an aid in the diagnosis of influenza from Nasopharyngeal swab specimens and should not be used as a sole basis for treatment. Nasal washings and aspirates are unacceptable for Xpert Xpress SARS-CoV-2/FLU/RSV testing.  Fact Sheet for Patients: BloggerCourse.com  Fact Sheet for Healthcare Providers: SeriousBroker.it  This test is not yet approved or cleared by the [place]  FDA and has been authorized for detection and/or diagnosis of SARS-CoV-2 by FDA under an Emergency Use Authorization (EUA). This EUA will remain in effect (meaning this test can be used) for the duration of the COVID-19 declaration under Section 564(b)(1) of the Act, 21 U.S.C. section 360bbb-3(b)(1), unless the authorization is terminated or revoked.     Resp Syncytial Virus by PCR NEGATIVE NEGATIVE Final    Comment: (NOTE) Fact Sheet for Patients: BloggerCourse.com  Fact Sheet for Healthcare Providers: SeriousBroker.it  This test is not yet approved or cleared by the [place]  FDA and has been authorized for detection and/or diagnosis of SARS-CoV-2 by FDA under an Emergency Use Authorization (EUA). This EUA will remain in effect (meaning this test  can be used) for the duration of the COVID-19 declaration under Section 564(b)(1) of the Act, 21 U.S.C. section 360bbb-3(b)(1),  unless the authorization is terminated or revoked.  Performed at [facility], [address], [place], [place] [address]   MRSA Next Gen by PCR, Nasal     Status: None   Collection Time: 06/01/23 12:46 AM   Specimen: Nasal Mucosa; Nasal Swab  Result Value Ref Range Status   MRSA by PCR Next Gen NOT DETECTED NOT DETECTED Final    Comment: (NOTE) The GeneXpert MRSA Assay (FDA approved for NASAL specimens only), is one component of a comprehensive MRSA colonization surveillance program. It is not intended to diagnose MRSA infection nor to guide or monitor treatment for MRSA infections. Test performance is not FDA approved in patients less than 21 years old. Performed at [facility], [address], [place], [place] [address]     Labs: CBC: Recent Labs  Lab 08/08/23 2144 08/10/23 0222 08/11/23 0439 08/12/23 0432 08/13/23 0356  WBC 8.6 8.9 5.8 4.6 5.2  HGB 14.5 11.5* 11.5* 11.7* 12.4  HCT 45.1 36.4 36.7 36.5 38.3  MCV 89.7 90.8 91.8 91.9 90.5  PLT 164 149* 143* 139* 157   Basic Metabolic Panel: Recent Labs  Lab 08/08/23 2144 08/10/23 0222 08/12/23 0432 08/13/23 0356  NA 142 138 140 140  K 4.0 3.3* 3.4* 3.4*  CL 103 105 108 111  CO2 22 25 22  20*  GLUCOSE 188* 106* 92 90  BUN 17 20 16 10   CREATININE 1.50* 1.35* 1.11* 1.03*  CALCIUM  9.6 8.6* 9.1 8.7*   Liver Function Tests: Recent Labs  Lab 08/08/23 2144  AST 33  ALT 16  ALKPHOS 102  BILITOT 1.6*  PROT 6.8  ALBUMIN 4.3   CBG: Recent Labs  Lab 08/12/23 1220 08/12/23 1640 08/12/23 2125 08/12/23 2237 08/13/23 0803  GLUCAP 75 65* 203* 184* 85    Discharge time spent: approximately 45 minutes spent on discharge counseling, evaluation of patient on day of discharge, and coordination of discharge planning with nursing, social work, pharmacy and case  management  Signed: Ephriam Hashimoto, MD [facility] 08/13/2023

## 2023-08-13 NOTE — Progress Notes (Addendum)
 Rounding Note    Patient Name: Anna Klein Date of Encounter: 08/13/2023  [facility] Cardiologist: Dr Lorie Rook  Subjective   Pt denies CP or dyspnea  Inpatient Medications    Scheduled Meds:  aspirin  EC  81 mg Oral Daily   atorvastatin   40 mg Oral Daily   clopidogrel   75 mg Oral Q breakfast   empagliflozin   10 mg Oral Daily   gabapentin   300 mg Oral QHS   insulin  aspart  0-15 Units Subcutaneous TID WC   insulin  aspart  0-5 Units Subcutaneous QHS   melatonin  3 mg Oral QHS   metoprolol  succinate  50 mg Oral Daily   pantoprazole   20 mg Oral Daily   sodium chloride  flush  3 mL Intravenous Q12H   Continuous Infusions:  sodium chloride      nitroGLYCERIN  Stopped (08/09/23 1640)   PRN Meds: sodium chloride , acetaminophen  **OR** acetaminophen , alum & mag hydroxide-simeth, ondansetron  **OR** ondansetron  (ZOFRAN ) IV, sodium chloride  flush   Vital Signs    Vitals:   08/12/23 1617 08/12/23 2125 08/12/23 2333 08/13/23 0501  BP: (!) 145/72 122/64 (!) 118/56 (!) 124/59  Pulse: 77 79 78 66  Resp: 20 18 16 15   Temp: 97.7 F (36.5 C) 97.7 F (36.5 C) 97.7 F (36.5 C) 97.6 F (36.4 C)  TempSrc: Oral Oral Oral Oral  SpO2: 98% 98% 98% 98%  Weight:      Height:        Intake/Output Summary (Last 24 hours) at 08/13/2023 1610 Last data filed at 08/12/2023 2000 Gross per 24 hour  Intake 200 ml  Output --  Net 200 ml      08/09/2023    2:00 PM 08/08/2023    9:23 PM 06/17/2023    8:46 AM  Last 3 Weights  Weight (lbs) 160 lb 1.6 oz 160 lb 162 lb 3.2 oz  Weight (kg) 72.621 kg 72.576 kg 73.573 kg      Telemetry    Sinus with PVCs- Personally Reviewed   Physical Exam   GEN: NAD, WD Neck: supple Cardiac: RRR Respiratory: CTA GI: Soft, NT/ND MS: No edema; radial cath site with no hematoma. Neuro:  grossly intact Psych: Normal affect   Labs    High Sensitivity Troponin:   Recent Labs  Lab 08/08/23 2144 08/08/23 2353 08/09/23 0210 08/09/23 0340   TROPONINIHS 27* 49* 80* 89*     Chemistry Recent Labs  Lab 08/08/23 2144 08/10/23 0222 08/12/23 0432 08/13/23 0356  NA 142 138 140 140  K 4.0 3.3* 3.4* 3.4*  CL 103 105 108 111  CO2 22 25 22  20*  GLUCOSE 188* 106* 92 90  BUN 17 20 16 10   CREATININE 1.50* 1.35* 1.11* 1.03*  CALCIUM  9.6 8.6* 9.1 8.7*  PROT 6.8  --   --   --   ALBUMIN 4.3  --   --   --   AST 33  --   --   --   ALT 16  --   --   --   ALKPHOS 102  --   --   --   BILITOT 1.6*  --   --   --   GFRNONAA 35* 40* 51* 55*  ANIONGAP 17* 8 10 9      Hematology Recent Labs  Lab 08/11/23 0439 08/12/23 0432 08/13/23 0356  WBC 5.8 4.6 5.2  RBC 4.00 3.97 4.23  HGB 11.5* 11.7* 12.4  HCT 36.7 36.5 38.3  MCV 91.8 91.9 90.5  MCH  28.8 29.5 29.3  MCHC 31.3 32.1 32.4  RDW 14.4 14.3 14.6  PLT 143* 139* 157   BNP Recent Labs  Lab 08/09/23 1027  BNP 616.1*    DDimer  Recent Labs  Lab 08/09/23 0150  DDIMER 0.44      Patient Profile     78 year old female with past medical history of hypertension, hyperlipidemia, chronic stage IIIb kidney disease, gastroesophageal reflux disease, recent admission with non-ST elevation microinfarction for evaluation of chest pain/recurrent non-ST elevation myocardial infarction.  Patient was admitted March 2025 with non-ST elevation myocardial infarction with peak troponin 2210.  Echocardiogram showed ejection fraction 45 to 50% and there appears to be hypokinesis of the anteroseptal wall and apex.  Cardiac catheterization March 2025 showed ejection fraction 45% with anterior and apical hypokinesis, moderate stenosis in the proximal LAD and 20% right coronary artery.  This was felt to be an unstable/ulcerated plaque in the proximal LAD but there was TIMI III flow and medical therapy recommended.  Now with recurrent CP.  Follow-up echocardiogram May 26 showed ejection fraction 55%, grade 3 diastolic dysfunction, moderate left atrial enlargement.  Cardiac catheterization yesterday reveals  nonobstructive coronary artery disease.  Assessment & Plan    1 non-ST elevation myocardial infarction-follow-up cardiac catheterization revealed no obstructive coronary artery disease.  Plan will be medical therapy.  Plan aspirin , Plavix , statin and Toprol .  Discontinue IV nitroglycerin .  Note follow-up echocardiogram showed preserved LV function.    2 hyperlipidemia-continue statin.   3 hypertension-BP elevated yesterday.  Patient was on Entresto  prior to admission at 4951 twice daily.  Will resume at 24/26 twice daily and follow blood pressure.   4 chronic stage IIIb kidney disease-renal function unchanged this morning.  We will resume torsemide  at 10 mg every other day which she states was controlling her lower extremity edema at home.  Check potassium and renal function in 1 week.  5 hypokalemia-supplement.   For questions or updates, please contact [facility] Please consult [email or web address] for contact info under        Signed, Alexandria Angel, MD  08/13/2023, 7:27 AM

## 2023-08-13 NOTE — Care Management Important Message (Signed)
 Important Message  Patient Details  Name: Anna Klein MRN: [id number] Date of Birth: 15-Oct-1945   Important Message Given:  Yes - Medicare IM     Janith Melnick 08/13/2023, 9:42 AM

## 2023-08-20 LAB — BASIC METABOLIC PANEL WITH GFR: EGFR: 43

## 2023-08-26 ENCOUNTER — Ambulatory Visit: Admitting: Nurse Practitioner

## 2023-09-23 ENCOUNTER — Ambulatory Visit: Admitting: Nurse Practitioner

## 2023-10-01 ENCOUNTER — Telehealth: Payer: Self-pay

## 2023-10-01 NOTE — Telephone Encounter (Signed)
   Pre-operative Risk Assessment    Patient Name: Anna Klein  DOB: 23-Jun-1945 MRN: [id number]   Date of last office visit: 06/17/23 Date of next office visit: 10/29/23   Request for Surgical Clearance    Procedure:  EGD   Date of Surgery:  Clearance TBD                                 Surgeon:   Surgeon's Group or Practice Name:  [facility]  Phone number:  [phone] Fax number:  [phone]   Type of Clearance Requested:   - Medical  - Pharmacy:  Hold Aspirin  and Clopidogrel  (Plavix ) Plavix  5 days prior    Type of Anesthesia:  Not Indicated   Additional requests/questions:    Bonney Rebeca Blight   10/01/2023, 5:11 PM

## 2023-10-02 NOTE — Telephone Encounter (Signed)
   Name: Casmira Cramer  DOB: 01-Aug-1945  MRN: [id number]  Primary Cardiologist: None  Chart reviewed as part of pre-operative protocol coverage. The patient has an upcoming visit scheduled with Dr. Pietro on 10/29/2023 at which time clearance can be addressed in case there are any issues that would impact surgical recommendations.  EGD is not scheduled until TBD as below. I added preop FYI to appointment note so that provider is aware to address at time of outpatient visit.  Per office protocol the cardiology provider should forward their finalized clearance decision and recommendations regarding antiplatelet therapy to the requesting party below.    Holding of Aspirin  and Plavix  will be addressed at upcoming visit with Dr. Pietro.  I will route this message as FYI to requesting party and remove this message from the preop box as separate preop APP input not needed at this time.   Please call with any questions.  Lum LITTIE Louis, NP  10/02/2023, 11:12 AM

## 2023-10-16 NOTE — Progress Notes (Signed)
 HPI: FU CAD. Patient was admitted March 2025 with non-ST elevation myocardial infarction with peak troponin 2210.  Echocardiogram showed ejection fraction 45 to 50% and there appears to be hypokinesis of the anteroseptal wall and apex.  Cardiac catheterization March 2025 showed ejection fraction 45% with anterior and apical hypokinesis, moderate stenosis in the proximal LAD and 20% right coronary artery.  This was felt to be an unstable/ulcerated plaque in the proximal LAD but there was TIMI III flow and medical therapy recommended.  Admitted again in Aug 11, 2023 with complaints of chest pain similar to previous symptoms.  Echocardiogram at that time showed ejection fraction 55%, grade 3 diastolic dysfunction, moderate left atrial enlargement.  Repeat cardiac catheterization revealed 30% proximal to mid LAD, 10% circumflex and 20% RCA.  There was no evidence of microvascular dysfunction.  Since last seen she denies dyspnea on exertion, orthopnea, PND, pedal edema or chest pain.  No dizziness or syncope.  Some fatigue.  Current Outpatient Medications  Medication Sig Dispense Refill   acetaminophen  (TYLENOL ) 325 MG tablet Take 650 mg by mouth every 6 (six) hours as needed for mild pain (pain score 1-3).     ascorbic acid (VITAMIN C) 500 MG tablet Take 500 mg by mouth daily.     aspirin  EC 81 MG tablet Take 1 tablet (81 mg total) by mouth daily. Swallow whole. (Patient taking differently: Take 81 mg by mouth at bedtime. Swallow whole.) 30 tablet 0   atorvastatin  (LIPITOR ) 40 MG tablet Take 1 tablet (40 mg total) by mouth daily. (Patient taking differently: Take 40 mg by mouth at bedtime.) 30 tablet 0   brimonidine (ALPHAGAN) 0.2 % ophthalmic solution Place 1 drop into both eyes 3 (three) times daily.     clopidogrel  (PLAVIX ) 75 MG tablet Take 1 tablet (75 mg total) by mouth daily with breakfast. 30 tablet 0   cyanocobalamin  (,VITAMIN B-12,) 1000 MCG/ML injection Inject 1 mL (1,000 mcg total) into the  muscle once a week. 1 mL 0   dorzolamide -timolol  (COSOPT ) 2-0.5 % ophthalmic solution Place 1 drop into both eyes 2 (two) times daily.     empagliflozin  (JARDIANCE ) 10 MG TABS tablet Take 1 tablet (10 mg total) by mouth daily. 30 tablet 0   gabapentin  (NEURONTIN ) 300 MG capsule Take 300 mg by mouth at bedtime.     Magnesium  250 MG TABS Take 500 mg by mouth at bedtime.     melatonin 3 MG TABS tablet Take 3 mg by mouth at bedtime.     metoprolol  succinate (TOPROL  XL) 50 MG 24 hr tablet Take 1 tablet (50 mg total) by mouth daily. Take with or immediately following a meal. 30 tablet 0   Multiple Vitamins-Minerals (PRESERVISION AREDS) CAPS Take 1 capsule by mouth in the morning and at bedtime.     nitroGLYCERIN  (NITROSTAT ) 0.4 MG SL tablet Place 1 tablet (0.4 mg total) under the tongue every 5 (five) minutes as needed for chest pain. 25 tablet 0   pantoprazole  (PROTONIX ) 20 MG tablet Take 1 tablet (20 mg total) by mouth daily. 30 tablet 4   potassium chloride  SA (KLOR-CON  M) 20 MEQ tablet Take 20 mEq by mouth daily.     ROCKLATAN 0.02-0.005 % SOLN Place 1 drop into both eyes at bedtime.     sacubitril -valsartan  (ENTRESTO ) 24-26 MG Take 1 tablet by mouth 2 (two) times daily. 120 tablet 0   torsemide  (DEMADEX ) 10 MG tablet Take 1 tablet (10 mg total) by mouth every  other day. Can take an additional tablet as needed for weight gain 30 tablet 0   Vitamin A 3 MG (10000 UT) TABS Take 1 tablet by mouth daily.     No current facility-administered medications for this visit.     Past Medical History:  Diagnosis Date   DM (diabetes mellitus) (HCC)    GBS (Guillain Barre syndrome) (HCC)    Hypertension    Renal disorder    Stroke [facility])     Past Surgical History:  Procedure Laterality Date   CHOLECYSTECTOMY N/A 03/25/2014   Procedure: LAPAROSCOPIC CHOLECYSTECTOMY WITH INTRAOPERATIVE CHOLANGIOGRAM;  Surgeon: Lynda Leos, MD;  Location: [facility] OR;  Service: General;  Laterality: N/A;   CORONARY  PRESSURE/FFR STUDY N/A 08/12/2023   Procedure: CORONARY PRESSURE/FFR STUDY;  Surgeon: Wendel Lurena POUR, MD;  Location: [facility] INVASIVE CV LAB;  Service: Cardiovascular;  Laterality: N/A;   LEFT HEART CATH AND CORONARY ANGIOGRAPHY N/A 06/02/2023   Procedure: LEFT HEART CATH AND CORONARY ANGIOGRAPHY;  Surgeon: Ladona Heinz, MD;  Location: [facility] INVASIVE CV LAB;  Service: Cardiovascular;  Laterality: N/A;   LEFT HEART CATH AND CORONARY ANGIOGRAPHY N/A 08/12/2023   Procedure: LEFT HEART CATH AND CORONARY ANGIOGRAPHY;  Surgeon: Wendel Lurena POUR, MD;  Location: [facility] INVASIVE CV LAB;  Service: Cardiovascular;  Laterality: N/A;    Social History   Socioeconomic History   Marital status: Widowed    Spouse name: Not on file   Number of children: Not on file   Years of education: Not on file   Highest education level: Not on file  Occupational History   Not on file  Tobacco Use   Smoking status: Never   Smokeless tobacco: Not on file  Substance and Sexual Activity   Alcohol use: No   Drug use: No   Sexual activity: Yes    Birth control/protection: Post-menopausal  Other Topics Concern   Not on file  Social History Narrative   Not on file   Social Drivers of Health   Financial Resource Strain: Not on file  Food Insecurity: No Food Insecurity (08/10/2023)   Hunger Vital Sign    Worried About Running Out of Food in the Last Year: Never true    Ran Out of Food in the Last Year: Never true  Transportation Needs: No Transportation Needs (08/10/2023)   PRAPARE - Administrator, Civil Service (Medical): No    Lack of Transportation (Non-Medical): No  Physical Activity: Not on file  Stress: Not on file  Social Connections: Socially Isolated (08/10/2023)   Social Connection and Isolation Panel    Frequency of Communication with Friends and Family: More than three times a week    Frequency of Social Gatherings with Friends and Family: More than three times a week    Attends Religious Services:  Never    Database administrator or Organizations: No    Attends Banker Meetings: Never    Marital Status: Widowed  Intimate Partner Violence: Not At Risk (08/10/2023)   Humiliation, Afraid, Rape, and Kick questionnaire    Fear of Current or Ex-Partner: No    Emotionally Abused: No    Physically Abused: No    Sexually Abused: No    Family History  Problem Relation Age of Onset   Hypertension Mother    Hypertension Father     ROS: no fevers or chills, productive cough, hemoptysis, dysphasia, odynophagia, melena, hematochezia, dysuria, hematuria, rash, seizure activity, orthopnea, PND, pedal edema, claudication. Remaining systems are  negative.  Physical Exam: Well-developed well-nourished in no acute distress.  Skin is warm and dry.  HEENT is normal.  Neck is supple.  Chest is clear to auscultation with normal expansion.  Cardiovascular exam is regular rate and rhythm.  2/6 systolic murmur left sternal border. Abdominal exam nontender or distended. No masses palpated. Extremities show no edema. neuro grossly intact   A/P  1 coronary artery disease-previous catheterization revealed mild nonobstructive coronary artery disease.  Plan to continue medical therapy with aspirin , Plavix , Toprol  and statin.  2 hyperlipidemia-continue statin.  3 hypertension-patient's blood pressure is controlled.  Continue present medical management.  If her blood pressure decreases further we will likely discontinue Entresto .  4 chronic stage IIIb kidney disease  5 preoperative evaluation prior to endoscopy-no recurrent symptoms.  She may hold her Plavix  for 5 days prior to upcoming endoscopies and resume after.  Redell Shallow, MD

## 2023-10-29 ENCOUNTER — Encounter: Payer: Self-pay | Admitting: Cardiology

## 2023-10-29 ENCOUNTER — Ambulatory Visit: Attending: Cardiology | Admitting: Cardiology

## 2023-10-29 VITALS — BP 102/64 | HR 70 | Ht 60.0 in | Wt 148.1 lb

## 2023-10-29 DIAGNOSIS — Z01810 Encounter for preprocedural cardiovascular examination: Secondary | ICD-10-CM | POA: Diagnosis not present

## 2023-10-29 DIAGNOSIS — I2510 Atherosclerotic heart disease of native coronary artery without angina pectoris: Secondary | ICD-10-CM

## 2023-10-29 DIAGNOSIS — I10 Essential (primary) hypertension: Secondary | ICD-10-CM | POA: Diagnosis not present

## 2023-10-29 DIAGNOSIS — E785 Hyperlipidemia, unspecified: Secondary | ICD-10-CM | POA: Diagnosis not present

## 2023-10-29 NOTE — Patient Instructions (Signed)
   Follow-Up: At [facility], you and your health needs are our priority.  As part of our continuing mission to provide you with exceptional heart care, our providers are all part of one team.  This team includes your primary Cardiologist (physician) and Advanced Practice Providers or APPs (Physician Assistants and Nurse Practitioners) who all work together to provide you with the care you need, when you need it.  Your next appointment:   6 month(s)  Provider:   Redell Shallow MD {If no MD populates, click here to update Cardiologist or

## 2023-11-07 ENCOUNTER — Other Ambulatory Visit: Payer: Self-pay | Admitting: Nurse Practitioner

## 2024-02-27 NOTE — Progress Notes (Signed)
 " Assessment and Plan:   1. Chronic diastolic heart failure    (CMD) (Primary) Continue Entresto .  Monitoring weight which is down 8 pounds since last visit. Decrease metoprolol  to 25 mg due to low blood pressure Keep cardiology follow-up  2. Hypercholesterolemia Continue Lipitor  - Comprehensive Metabolic Panel; Future - Lipid Panel; Future - Comprehensive Metabolic Panel - Lipid Panel  3. Essential (primary) hypertension Decrease metoprolol  to 25 mg - metoprolol  succinate (TOPROL  XL) 25 mg 24 hr tablet; Take 1 tablet (25 mg total) by mouth daily.  Dispense: 90 tablet; Refill: 3  4. Stage 3b chronic kidney disease (CMD) Stable  5. Type 2 diabetes mellitus without complication, without long-term current use of insulin     (CMD) Well-controlled - Hemoglobin A1C With Estimated Average Glucose; Future - Hemoglobin A1C With Estimated Average Glucose  6. B12 deficiency Continue supplement - Vitamin B12; Future - Vitamin B12  7. Vitamin D deficiency, unspecified - Vitamin D, 25-Hydroxy; Future - Vitamin D, 25-Hydroxy  8. Acute non-recurrent maxillary sinusitis Advised patient to get plenty of rest and fluids.  Encouraged taking all medication until finished.  Follow up if symptoms not improving in time frame discussed.  - doxycycline (VIBRA-TABS) 100 mg tablet; Take 1 tablet (100 mg total) by mouth 2 (two) times a day for 10 days.  Dispense: 20 tablet; Refill: 0  9. Hiatal hernia with GERD Continue small meals and Pepcid and Protonix .  Patient is considering surgical correction in the future  10. Controlled type 2 diabetes mellitus with chronic kidney disease (CMD) Stable  Plan: Patient expresses understanding of their current medications and use.  If a new prescription was given today, then I discussed potential side effects, drug interactions, instructions for taking the medication, and the consequences of not taking it.   Patient verbalized an understanding of these  instructions. Patient is able to verbalize understanding of the care plan discussed today. Patient's medical and personal goals were discussed today. Barriers to current goals:  None Follow up as discussed in  6 month(s):: CPE and MWV Call sooner if needed.   HPI:  Patient returns for ongoing care, monitoring and treatment of their medical conditions.   Anna Klein is a 78 y.o. female here for DM, lipid, CHF, B12, CKD.  She has productive cough, sinus drainage, and facial pain x 1.5 weeks.   She took leftover steroid pack which helped some but now has green sinus drainage and cough not improving.  Hiatal hernia: Patient has adapted to eating very small meals at a time due to her hiatal hernia with abdominal pressure and pain.  Patient has lost 8 more pounds with a total of 17 pounds this year due to her GI issues.  She is still taking Protonix  and Pepcid daily and considering surgical repair in the future.   Diabetes Presents in follow up of her diabetes. Patient's most recent labs include: Lab Results  Component Value Date   HGBA1C 6.2 (H) 11/04/2023   HGBA1C 6.0 (H) 03/10/2023   HGBA1C 6.3 (H) 11/11/2022   Patient does not check home blood sugars.   Home FSBS: patient does not check sugars Hypoglycemic episodes: No On ACE-I/ARB: No On statin therapy: Yes On ASA: --Patient is on therapy Patient reports no side effects to diabetic medications. Patient has no new complaints of foot numbness or burning or loss of sensation. Pt reports no lesions on the feet currently.  Hyperlipidemia Presents in follow up of her cholesterol. Current lipid lowering medication--Lipitor  reviewed. Tolerating  medication well. No side effects. No myalgias. Lipid panel is current. Lab Results  Component Value Date   CHOL 106 11/04/2023   TRIG 173 (H) 11/04/2023   HDL 49 (L) 11/04/2023   LDLCALC 32 11/04/2023   Hypertension/CHF Patient presents for hypertension follow up. Current  antihypertensive medications reviewed. Tolerating medication well. ROS: No chest pain, shortness of breath, palpitations or worsening edema.  Patient is followed by cardiology and still taking her Entresto  and Jardiance .  Since losing weight due to her GI symptoms she has noticed her blood pressure is running very low and would like to discuss possible adjustment.  CKD stage III: Patient is avoiding NSAIDs and trying to stay hydrated. BP Readings from Last 3 Encounters:  03/04/24 (!) 117/58  11/04/23 113/68  10/01/23 118/62   Patient continues her vitamin D and B12 supplements.   Past Medical/Surgical History:   Medical History[1] Surgical History[2]  Family History:   Family History[3]  Social History:   Social History[4]  Allergies:   Aspirin , Codeine, Influenza virus vaccines, Nsaids (non-steroidal anti-inflammatory drug), Pollen extracts, and Tolmetin  Current Medications:   Current Medications[5]  Health Maintenance:    Immunizations:   Immunization History  Administered Date(s) Administered   Influenza, Unspecified 12/17/2011, 12/15/2012, 01/20/2015   Pneumococcal Polysaccharide Vaccine, 23 Valent (PNEUMOVAX-23) 2Y+ 03/18/2010, 12/17/2010, 01/20/2012   Zoster, Live 09/28/2014    I have reviewed and (if needed) updated the patient's problem list, medications, allergies, past medical and surgical history, social and family history.  ROS:   Review of Systems  Constitutional:  Negative for chills, fatigue and fever.  HENT:  Positive for congestion, postnasal drip, sinus pressure and sinus pain.   Eyes:  Negative for visual disturbance.  Respiratory:  Positive for cough and wheezing. Negative for shortness of breath.   Cardiovascular:  Negative for chest pain and palpitations.  Gastrointestinal:  Negative for abdominal pain.  Endocrine: Negative for cold intolerance and heat intolerance.  Genitourinary:  Negative for difficulty urinating.   Musculoskeletal:  Negative for arthralgias.  Skin:  Negative for rash.  Neurological:  Negative for dizziness and weakness.  Hematological:  Negative for adenopathy.  Psychiatric/Behavioral:  Negative for dysphoric mood.     Vital Signs:   Wt Readings from Last 3 Encounters:  03/04/24 63.5 kg (140 lb)  11/04/23 67.1 kg (148 lb)  10/01/23 71.2 kg (157 lb)   Temp Readings from Last 3 Encounters:  03/04/24 97.1 F (36.2 C)  11/04/23 97.1 F (36.2 C)  08/25/23 97.3 F (36.3 C) (Temporal)   BP Readings from Last 3 Encounters:  03/04/24 (!) 117/58  11/04/23 113/68  10/01/23 118/62   Pulse Readings from Last 3 Encounters:  03/04/24 56  11/04/23 66  08/25/23 75     BP (!) 117/58   Pulse 56   Temp 97.1 F (36.2 C)   Resp 16   Ht 1.549 m (5' 1)   Wt 63.5 kg (140 lb)   SpO2 98%   BMI 26.45 kg/m  Body mass index is 26.45 kg/m.    Objective:  Physical Exam General Appearance:  Alert and oriented x 3; cooperative and in no distress.  Well-developed and well-nourished female.  Sitting comfortably and conversing normally. Head:  Normocephalic, without obvious abnormality, atraumatic   Eyes:  Conjunctiva/corneas clear, EOM's intact  Ears:  Normal TMs and external ear canals in both ears Nose:  Nares normal, septum midline, mucosa-no edema/erythema.  Maxillary sinus tenderness.  Throat:  Lips, mucosa and tongue normal; teeth  and gums normal.  Oropharynx-mild erythema.  Neck: Supple, symmetrical, trachea midline;  no carotid bruits  Lymphatics: Cervical, supraclavicular and axillary nodes normal Thyroid:  No thyroid enlargement/tenderness/nodules Heart: Regular rate and rhythm, S1, S2 normal; no murmur, click, rub or gallop   Lungs: Clear to auscultation bilaterally without wheezing.  Congested cough Musculoskeletal: Normal range of motion of upper and lower extremities.  Muscle strength is grossly normal.  No redness, swelling or tenderness of joints.  No deviation of  spine.  No edema. Neurologic: Grossly normal. Cranial nerves II-XII intact, normal strength, sensation and reflexes throughout. Gait is normal. Pulses: 2+ symmetric. Skin: Skin color, texture, turgor normal. No rashes or lesions.  Psychiatric: She has a normal mood and affect.  Her speech is normal and nonpressured.  Thought content is normal.    Behavioral Health Screening  Patient Health Questionnaire-2 Score: 0 (03/04/2024 10:46 AM)      Patient's Depression screening is Negative   Depression Plan: Normal/Negative Screening  Recent lab work reviewed and analyzed: Lab Results  Component Value Date   WBC 6.40 11/11/2022   HGB 13.3 11/11/2022   HCT 39.4 11/11/2022   PLT 182 11/11/2022   CHOL 106 11/04/2023   TRIG 173 (H) 11/04/2023   HDL 49 (L) 11/04/2023   LDLCALC 32 11/04/2023   ALT 9 11/04/2023   AST 12 (L) 11/04/2023   NA 135 (L) 11/04/2023   K 4.3 11/04/2023   CL 98 11/04/2023   CREATININE 1.30 (H) 11/04/2023   BUN 17 11/04/2023   CO2 29 11/04/2023   TSH 3.561 11/11/2022   GLUCOSE 98 11/04/2023   HGBA1C 6.2 (H) 11/04/2023   VITD 50.3 11/04/2023   VITAMINB12 734 11/04/2023    Recent imaging reviewed and analyzed: US  ABDOMEN COMPLETE CLINICAL DATA:  Abdominal pain and nausea. Cholecystectomy 3 years ago.  EXAM: ABDOMEN ULTRASOUND COMPLETE  COMPARISON:  05/08/2015 CT  FINDINGS: Gallbladder: Surgically absent  Common bile duct: Diameter: Normal, 6 mm.  Liver: Increased in echogenicity. Portal vein is patent on color Doppler imaging with normal direction of blood flow towards the liver.  IVC: No abnormality visualized.  Pancreas: Visualized portion unremarkable.  Spleen: Size and appearance within normal limits.  Right Kidney: Length: 9.6 cm. Echogenicity within normal limits. No mass or hydronephrosis visualized.  Left Kidney: Length: 9.5 cm. Echogenicity within normal limits. No mass or hydronephrosis visualized.  Abdominal aorta: Poorly  visualized due to overlying bowel gas.  Other findings: No ascites.  IMPRESSION: 1.  No acute process or explanation for nausea. 2. Cholecystectomy, without biliary duct dilatation. 3. Increased hepatic echogenicity, suggesting steatosis.  Electronically Signed   By: Rockey Kilts M.D.   On: 08/29/2017 15:29 XR CHEST PA AND LATERAL CLINICAL DATA:  Persistent cough and fatigue, not responsive to course of antibiotics and prednisone.  EXAM: CHEST  2 VIEW  COMPARISON:  01/29/2017  FINDINGS: Heart size remains within normal limits. Ectasia of the thoracic aorta is stable. Eventration of right hemidiaphragm and moderate hiatal hernia are unchanged in appearance.  No evidence of pulmonary infiltrate or edema. No evidence of pleural effusion. Cervical spine fusion hardware again seen, as well as old vertebral body compression fracture deformity at the thoracolumbar junction.  IMPRESSION: No active lung disease.  Unchanged moderate hiatal hernia and eventration of right hemidiaphragm.  Electronically Signed   By: Norleen Kil M.D.   On: 03/26/2017 17:32   This document serves as a record of services personally performed by Katheryn Billing, PA-C.  It  was created on their behalf by Rosaline Gauss, CMA, a trained medical scribe, and Certified Medical Assistant (CMA). During the course of documenting the history, physical exam and medical decision making, I was functioning as a stage manager. The creation of this record is the providers dictation and/or activities during the visit.   Electronically signed by Rosaline Gauss, CMA 03/04/2024 10:47 AM   I agree the documentation is accurate and complete.          [1] Past Medical History: Diagnosis Date   Hypertension    Macular degeneration   [2] Past Surgical History: Procedure Laterality Date   CHOLECYSTECTOMY     Procedure: CHOLECYSTECTOMY   NECK SURGERY  2018   Procedure: NECK SURGERY  [3] Family  History Problem Relation Name Age of Onset   Hypertension Mother     Macular degeneration Mother     Hypertension Father    [4] Social History Socioeconomic History   Marital status: Widowed  Tobacco Use   Smoking status: Never   Smokeless tobacco: Never  Substance and Sexual Activity   Alcohol use: Not Currently   Drug use: Never   Social Drivers of Health   Living Situation: Low Risk (08/10/2023)   Received from [facility] Situation    In the last 12 months, was there a time when you were not able to pay the mortgage or rent on time?: No    In the past 12 months, how many times have you moved where you were living?: 0    At any time in the past 12 months, were you homeless or living in a shelter (including now)?: No  Food Insecurity: No Food Insecurity (08/10/2023)   Received from [facility]   Food vital sign    Within the past 12 months, you worried that your food would run out before you got the money to buy more.: Never true    Within the past 12 months, the food you bought just didn't last and you didn't have money to get more.: Never true  Transportation Needs: No Transportation Needs (08/10/2023)   Received from [facility] - Transportation    Lack of Transportation (Medical): No    Lack of Transportation (Non-Medical): No  Utilities: Not At Risk (08/10/2023)   Received from [facility] Utilities    Threatened with loss of utilities: No  Safety: Low Risk (03/04/2024)   Safety    How often does anyone, including family and friends, physically hurt you?: Never    How often does anyone, including family and friends, insult or talk down to you?: Never    How often does anyone, including family and friends, threaten you with harm?: Never    How often does anyone, including family and friends, scream or curse at you?: Never  Alcohol Screening: Not At Risk (11/11/2022)   Alcohol    Audit C Alcohol risk score: 0  Tobacco Use:  Low Risk (03/04/2024)   Patient History    Smoking Tobacco Use: Never    Smokeless Tobacco Use: Never  Depression: Not At Risk (03/04/2024)   PHQ-2    PHQ-2 Score: 0  Social Connections: Socially Isolated (08/10/2023)   Received from [facility]   Social Connection and Isolation Panel    In a typical week, how many times do you talk on the phone with family, friends, or neighbors?: More than three times a week    How often do you get  together with friends or relatives?: More than three times a week    How often do you attend church or religious services?: Never    Do you belong to any clubs or organizations such as church groups, unions, fraternal or athletic groups, or school groups?: No    How often do you attend meetings of the clubs or organizations you belong to?: Never    Are you married, widowed, divorced, separated, never married, or living with a partner?: Widowed  [5] Current Outpatient Medications  Medication Sig Dispense Refill   ascorbic acid (VITAMIN C) 500 mg tablet Take 500 mg by mouth daily.     aspirin  81 mg EC tablet Take 1 tablet by mouth once daily 90 tablet 0   atorvastatin  (LIPITOR ) 40 mg tablet Take 1 tablet by mouth once daily 90 tablet 0   brimonidine (ALPHAGAN) 0.2 % ophthalmic solution INSTILL 1 DROP INTO LEFT EYE TWICE DAILY     cholecalciferol (VITAMIN D3) 1,000 unit (25 mcg) tablet Take 1,000 Units by mouth Once Daily.     clopidogreL  (PLAVIX ) 75 mg tablet Take 1 tablet by mouth once daily 90 tablet 0   cyanocobalamin  (VITAMIN B12) 1,000 mcg/mL injection INJECT 1ML INTO THE MUSCLE MONTHLY 3 mL 0   dorzolamide -timoloL  (COSOPT ) 22.3-6.8 mg/mL ophthalmic solution INSTILL 1 DROP INTO EACH EYE TWICE DAILY     Entresto  24-26 mg per tablet Take 1 tablet by mouth twice daily 60 tablet 0   famotidine (PEPCID) 40 mg tablet TAKE 1 TABLET BY MOUTH TWICE DAILY AS NEEDED FOR  HEARTBURN 180 tablet 0   gabapentin  (NEURONTIN ) 300 mg capsule Take 1 capsule  by mouth twice daily 180 capsule 2   Jardiance  10 mg tab Take 1 tablet by mouth once daily 90 tablet 0   magnesium  oxide (MAG-OX) 250 mg tablet Take 500 mg by mouth.     melatonin 3 mg tablet Take 3 mg by mouth nightly.      pantoprazole  (PROTONIX ) 20 mg EC tablet Take 1 tablet (20 mg total) by mouth daily. 90 tablet 1   potassium chloride  (KLOR-CON ) 20 mEq ER tablet Take 1 tablet (20 mEq total) by mouth daily. 90 tablet 3   Rocklatan 0.02-0.005 % drop INSTILL 1 DROP INTO EACH EYE IN THE EVENING     torsemide  (DEMADEX ) 10 mg tablet Take 1 tablet by mouth once daily 90 tablet 0   vitamin A 3,000 mcg (10,000 unit) tab Take 1 tablet by mouth daily.     zinc gluconate 50 mg tab tablet Take 50 mg by mouth.     BD Luer-Lok Syringe 3 mL 25 gauge x 1 syrg USE AS DIRECTED FOR B12 INJECTION     cyclobenzaprine  (FLEXERIL ) 10 mg tablet Take 1 tablet (10 mg total) by mouth 2 (two) times a day as needed for muscle spasms. (Patient not taking: Reported on 03/04/2024) 30 tablet 1   doxycycline (VIBRA-TABS) 100 mg tablet Take 1 tablet (100 mg total) by mouth 2 (two) times a day for 10 days. 20 tablet 0   fluconazole (DIFLUCAN) 150 mg tablet Take 1 tablet (150 mg total) by mouth once for 1 dose. Repeat if needed. 2 tablet 0   metoprolol  succinate (TOPROL  XL) 25 mg 24 hr tablet Take 1 tablet (25 mg total) by mouth daily. 90 tablet 3   nitroglycerin  (NITROSTAT ) 0.4 mg SL tablet Place 0.4 mg under the tongue every 5 (five) minutes as needed. (Patient not taking: Reported on 03/04/2024)  safety needles 25 gauge x 1 ndle 1 each by miscellaneous route as needed (For B 12 injections). 10 each 2   No current facility-administered medications for this visit.  "

## 2024-03-04 ENCOUNTER — Encounter: Payer: Self-pay | Admitting: Pediatrics

## 2024-04-19 ENCOUNTER — Ambulatory Visit: Admitting: Pediatrics

## 2024-05-04 ENCOUNTER — Ambulatory Visit: Admitting: Pediatrics

## 2024-05-14 ENCOUNTER — Ambulatory Visit: Admitting: Pediatrics
